# Patient Record
Sex: Female | Born: 1995 | Race: Black or African American | Hispanic: No | Marital: Single | Smoking: Never smoker
Health system: Southern US, Community
[De-identification: ages and names within clinical notes are randomized; demographics above are authoritative.]

## PROBLEM LIST (undated history)

## (undated) DIAGNOSIS — O139 Gestational [pregnancy-induced] hypertension without significant proteinuria, unspecified trimester: Secondary | ICD-10-CM

## (undated) DIAGNOSIS — O159 Eclampsia, unspecified as to time period: Secondary | ICD-10-CM

## (undated) DIAGNOSIS — R519 Headache, unspecified: Secondary | ICD-10-CM

## (undated) DIAGNOSIS — R51 Headache: Secondary | ICD-10-CM

## (undated) HISTORY — DX: Headache: R51

## (undated) HISTORY — DX: Headache, unspecified: R51.9

## (undated) HISTORY — PX: NO PAST SURGERIES: SHX2092

---
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
## 2014-03-19 ENCOUNTER — Telehealth: Payer: Self-pay | Admitting: Obstetrics & Gynecology

## 2014-03-23 ENCOUNTER — Ambulatory Visit: Payer: Self-pay | Admitting: Obstetrics & Gynecology

## 2014-03-23 NOTE — Telephone Encounter (Signed)
10.19.2015 - no call from patient.brm

## 2014-06-01 ENCOUNTER — Encounter: Payer: Self-pay | Admitting: *Deleted

## 2014-06-02 ENCOUNTER — Encounter: Payer: Self-pay | Admitting: Obstetrics & Gynecology

## 2014-09-06 ENCOUNTER — Encounter (HOSPITAL_COMMUNITY): Payer: Self-pay | Admitting: Emergency Medicine

## 2014-09-06 ENCOUNTER — Emergency Department (HOSPITAL_COMMUNITY)
Admission: EM | Admit: 2014-09-06 | Discharge: 2014-09-06 | Disposition: A | Discharge status: Home / Self Care | Payer: Medicaid Other | Attending: Emergency Medicine | Admitting: Emergency Medicine

## 2014-09-06 DIAGNOSIS — R079 Chest pain, unspecified: Secondary | ICD-10-CM | POA: Diagnosis present

## 2014-09-06 DIAGNOSIS — R0789 Other chest pain: Secondary | ICD-10-CM | POA: Diagnosis not present

## 2014-09-06 LAB — RAPID URINE DRUG SCREEN, HOSP PERFORMED
Amphetamines: NOT DETECTED
Barbiturates: NOT DETECTED
Benzodiazepines: NOT DETECTED
Cocaine: NOT DETECTED
Opiates: NOT DETECTED
Tetrahydrocannabinol: NOT DETECTED

## 2014-09-06 LAB — BASIC METABOLIC PANEL
ANION GAP: 9 (ref 5–15)
BUN: 6 mg/dL (ref 6–23)
CO2: 23 mmol/L (ref 19–32)
CREATININE: 0.74 mg/dL (ref 0.50–1.10)
Calcium: 9 mg/dL (ref 8.4–10.5)
Chloride: 103 mmol/L (ref 96–112)
GFR calc non Af Amer: >90 mL/min (ref >90)
Glucose, Bld: 88 mg/dL (ref 70–99)
Potassium: 3.8 mmol/L (ref 3.5–5.1)
Sodium: 135 mmol/L (ref 135–145)

## 2014-09-06 LAB — CBC
HCT: 33 % — ABNORMAL LOW (ref 36.0–46.0)
Hemoglobin: 10.7 g/dL — ABNORMAL LOW (ref 12.0–15.0)
MCH: 29 pg (ref 26.0–34.0)
MCHC: 32.4 g/dL (ref 30.0–36.0)
MCV: 89.4 fL (ref 78.0–100.0)
Platelets: 409 10*3/uL — ABNORMAL HIGH (ref 150–400)
RBC: 3.69 MIL/uL — ABNORMAL LOW (ref 3.87–5.11)
RDW: 12.1 % (ref 11.5–15.5)
WBC: 7.6 10*3/uL (ref 4.0–10.5)

## 2014-09-06 LAB — I-STAT TROPONIN, ED: Troponin i, poc: 0 ng/mL (ref 0.00–0.08)
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
## 2014-09-06 NOTE — Discharge Instructions (Signed)
Please refrain from alcohol use, and abuse. If you or melena close you feel she needs to seek further evaluation and management for alcohol-related problems please follow-up with substance abuse counseling. Chest wall pain can be relieved with ibuprofen or Tylenol, ice. If new or worsening signs or symptoms present please return immediately for further evaluation and management.

## 2014-09-08 ENCOUNTER — Telehealth (HOSPITAL_BASED_OUTPATIENT_CLINIC_OR_DEPARTMENT_OTHER): Payer: Self-pay | Admitting: Emergency Medicine

## 2014-09-09 ENCOUNTER — Emergency Department (HOSPITAL_COMMUNITY)
Admission: EM | Admit: 2014-09-09 | Discharge: 2014-09-09 | Disposition: A | Discharge status: Home / Self Care | Payer: Medicaid Other | Attending: Emergency Medicine | Admitting: Emergency Medicine

## 2014-09-09 ENCOUNTER — Encounter (HOSPITAL_COMMUNITY): Payer: Self-pay | Admitting: *Deleted

## 2014-09-09 ENCOUNTER — Emergency Department (HOSPITAL_COMMUNITY): Payer: Medicaid Other

## 2014-09-09 DIAGNOSIS — R519 Headache, unspecified: Secondary | ICD-10-CM

## 2014-09-09 DIAGNOSIS — R51 Headache: Secondary | ICD-10-CM | POA: Diagnosis not present

## 2014-09-09 MED ORDER — ASPIRIN-ACETAMINOPHEN-CAFFEINE 250-250-65 MG PO TABS: 1.0000 | ORAL_TABLET | Freq: Four times a day (QID) | ORAL | Status: DC | PRN

## 2014-09-09 MED ORDER — SODIUM CHLORIDE 0.9 % IV BOLUS (SEPSIS): 1000.0000 mL | Freq: Once | INTRAVENOUS | Status: DC

## 2014-09-09 MED ORDER — HYDROCODONE-ACETAMINOPHEN 5-325 MG PO TABS
1.0000 | ORAL_TABLET | Freq: Once | ORAL | Status: AC
  Administered 2014-09-09: 1 via ORAL
  Filled 2014-09-09: qty 1

## 2014-09-09 MED ORDER — ONDANSETRON 4 MG PO TBDP
4.0000 mg | ORAL_TABLET | Freq: Once | ORAL | Status: AC
  Administered 2014-09-09: 4 mg via ORAL
  Filled 2014-09-09: qty 1

## 2014-09-09 NOTE — ED Notes (Signed)
Pt sts she has had increased headaches in the last few months.  Pt sts she feels they usually "come out of nowhere" but when asked if she feels like they come at any specific time she sts "after I eat bojangles and noodles".

## 2014-09-09 NOTE — Discharge Instructions (Signed)
Headaches, Frequently Asked Questions °MIGRAINE HEADACHES °Q: What is migraine? What causes it? How can I treat it? °A: Generally, migraine headaches begin as a dull ache. Then they develop into a constant, throbbing, and pulsating pain. You may experience pain at the temples. You may experience pain at the front or back of one or both sides of the head. The pain is usually accompanied by a combination of: °· Nausea. °· Vomiting. °· Sensitivity to light and noise. °Some people (about 15%) experience an aura (see below) before an attack. The cause of migraine is believed to be chemical reactions in the brain. Treatment for migraine may include over-the-counter or prescription medications. It may also include self-help techniques. These include relaxation training and biofeedback.  °Q: What is an aura? °A: About 15% of people with migraine get an "aura". This is a sign of neurological symptoms that occur before a migraine headache. You may see wavy or jagged lines, dots, or flashing lights. You might experience tunnel vision or blind spots in one or both eyes. The aura can include visual or auditory hallucinations (something imagined). It may include disruptions in smell (such as strange odors), taste or touch. Other symptoms include: °· Numbness. °· A "pins and needles" sensation. °· Difficulty in recalling or speaking the correct word. °These neurological events may last as long as 60 minutes. These symptoms will fade as the headache begins. °Q: What is a trigger? °A: Certain physical or environmental factors can lead to or "trigger" a migraine. These include: °· Foods. °· Hormonal changes. °· Weather. °· Stress. °It is important to remember that triggers are different for everyone. To help prevent migraine attacks, you need to figure out which triggers affect you. Keep a headache diary. This is a good way to track triggers. The diary will help you talk to your healthcare professional about your condition. °Q: Does  weather affect migraines? °A: Bright sunshine, hot, humid conditions, and drastic changes in barometric pressure may lead to, or "trigger," a migraine attack in some people. But studies have shown that weather does not act as a trigger for everyone with migraines. °Q: What is the link between migraine and hormones? °A: Hormones start and regulate many of your body's functions. Hormones keep your body in balance within a constantly changing environment. The levels of hormones in your body are unbalanced at times. Examples are during menstruation, pregnancy, or menopause. That can lead to a migraine attack. In fact, about three quarters of all women with migraine report that their attacks are related to the menstrual cycle.  °Q: Is there an increased risk of stroke for migraine sufferers? °A: The likelihood of a migraine attack causing a stroke is very remote. That is not to say that migraine sufferers cannot have a stroke associated with their migraines. In persons under age 40, the most common associated factor for stroke is migraine headache. But over the course of a person's normal life span, the occurrence of migraine headache may actually be associated with a reduced risk of dying from cerebrovascular disease due to stroke.  °Q: What are acute medications for migraine? °A: Acute medications are used to treat the pain of the headache after it has started. Examples over-the-counter medications, NSAIDs, ergots, and triptans.  °Q: What are the triptans? °A: Triptans are the newest class of abortive medications. They are specifically targeted to treat migraine. Triptans are vasoconstrictors. They moderate some chemical reactions in the brain. The triptans work on receptors in your brain. Triptans help   to restore the balance of a neurotransmitter called serotonin. Fluctuations in levels of serotonin are thought to be a main cause of migraine.  °Q: Are over-the-counter medications for migraine effective? °A:  Over-the-counter, or "OTC," medications may be effective in relieving mild to moderate pain and associated symptoms of migraine. But you should see your caregiver before beginning any treatment regimen for migraine.  °Q: What are preventive medications for migraine? °A: Preventive medications for migraine are sometimes referred to as "prophylactic" treatments. They are used to reduce the frequency, severity, and length of migraine attacks. Examples of preventive medications include antiepileptic medications, antidepressants, beta-blockers, calcium channel blockers, and NSAIDs (nonsteroidal anti-inflammatory drugs). °Q: Why are anticonvulsants used to treat migraine? °A: During the past few years, there has been an increased interest in antiepileptic drugs for the prevention of migraine. They are sometimes referred to as "anticonvulsants". Both epilepsy and migraine may be caused by similar reactions in the brain.  °Q: Why are antidepressants used to treat migraine? °A: Antidepressants are typically used to treat people with depression. They may reduce migraine frequency by regulating chemical levels, such as serotonin, in the brain.  °Q: What alternative therapies are used to treat migraine? °A: The term "alternative therapies" is often used to describe treatments considered outside the scope of conventional Western medicine. Examples of alternative therapy include acupuncture, acupressure, and yoga. Another common alternative treatment is herbal therapy. Some herbs are believed to relieve headache pain. Always discuss alternative therapies with your caregiver before proceeding. Some herbal products contain arsenic and other toxins. °TENSION HEADACHES °Q: What is a tension-type headache? What causes it? How can I treat it? °A: Tension-type headaches occur randomly. They are often the result of temporary stress, anxiety, fatigue, or anger. Symptoms include soreness in your temples, a tightening band-like sensation  around your head (a "vice-like" ache). Symptoms can also include a pulling feeling, pressure sensations, and contracting head and neck muscles. The headache begins in your forehead, temples, or the back of your head and neck. Treatment for tension-type headache may include over-the-counter or prescription medications. Treatment may also include self-help techniques such as relaxation training and biofeedback. °CLUSTER HEADACHES °Q: What is a cluster headache? What causes it? How can I treat it? °A: Cluster headache gets its name because the attacks come in groups. The pain arrives with little, if any, warning. It is usually on one side of the head. A tearing or bloodshot eye and a runny nose on the same side of the headache may also accompany the pain. Cluster headaches are believed to be caused by chemical reactions in the brain. They have been described as the most severe and intense of any headache type. Treatment for cluster headache includes prescription medication and oxygen. °SINUS HEADACHES °Q: What is a sinus headache? What causes it? How can I treat it? °A: When a cavity in the bones of the face and skull (a sinus) becomes inflamed, the inflammation will cause localized pain. This condition is usually the result of an allergic reaction, a tumor, or an infection. If your headache is caused by a sinus blockage, such as an infection, you will probably have a fever. An x-ray will confirm a sinus blockage. Your caregiver's treatment might include antibiotics for the infection, as well as antihistamines or decongestants.  °REBOUND HEADACHES °Q: What is a rebound headache? What causes it? How can I treat it? °A: A pattern of taking acute headache medications too often can lead to a condition known as "rebound headache."   A pattern of taking too much headache medication includes taking it more than 2 days per week or in excessive amounts. That means more than the label or a caregiver advises. With rebound  headaches, your medications not only stop relieving pain, they actually begin to cause headaches. Doctors treat rebound headache by tapering the medication that is being overused. Sometimes your caregiver will gradually substitute a different type of treatment or medication. Stopping may be a challenge. Regularly overusing a medication increases the potential for serious side effects. Consult a caregiver if you regularly use headache medications more than 2 days per week or more than the label advises. °ADDITIONAL QUESTIONS AND ANSWERS °Q: What is biofeedback? °A: Biofeedback is a self-help treatment. Biofeedback uses special equipment to monitor your body's involuntary physical responses. Biofeedback monitors: °· Breathing. °· Pulse. °· Heart rate. °· Temperature. °· Muscle tension. °· Brain activity. °Biofeedback helps you refine and perfect your relaxation exercises. You learn to control the physical responses that are related to stress. Once the technique has been mastered, you do not need the equipment any more. °Q: Are headaches hereditary? °A: Four out of five (80%) of people that suffer report a family history of migraine. Scientists are not sure if this is genetic or a family predisposition. Despite the uncertainty, a child has a 50% chance of having migraine if one parent suffers. The child has a 75% chance if both parents suffer.  °Q: Can children get headaches? °A: By the time they reach high school, most young people have experienced some type of headache. Many safe and effective approaches or medications can prevent a headache from occurring or stop it after it has begun.  °Q: What type of doctor should I see to diagnose and treat my headache? °A: Start with your primary caregiver. Discuss his or her experience and approach to headaches. Discuss methods of classification, diagnosis, and treatment. Your caregiver may decide to recommend you to a headache specialist, depending upon your symptoms or other  physical conditions. Having diabetes, allergies, etc., may require a more comprehensive and inclusive approach to your headache. The [employer] will provide, upon request, a list of NHF physician members in your state. °Document Released: 08/12/2003 Document Revised: 08/14/2011 Document Reviewed: 01/20/2008 °Exit[employer]® Patient Information ©2015 [employer]. This information is not intended to replace advice given to you by your health care provider. Make sure you discuss any questions you have with your health care provider. ° °General Headache Without Cause °A headache is pain or discomfort felt around the head or neck area. The specific cause of a headache may not be found. There are many causes and types of headaches. A few common ones are: °· Tension headaches. °· Migraine headaches. °· Cluster headaches. °· Chronic daily headaches. °HOME CARE INSTRUCTIONS  °· Keep all follow-up appointments with your caregiver or any specialist referral. °· Only take over-the-counter or prescription medicines for pain or discomfort as directed by your caregiver. °· Lie down in a dark, quiet room when you have a headache. °· Keep a headache journal to find out what may trigger your migraine headaches. For example, write down: °¨ What you eat and drink. °¨ How much sleep you get. °¨ Any change to your diet or medicines. °· Try massage or other relaxation techniques. °· Put ice packs or heat on the head and neck. Use these 3 to 4 times per day for 15 to 20 minutes each time, or as needed. °· Limit stress. °· Sit up   straight, and do not tense your muscles. °· Quit smoking if you smoke. °· Limit alcohol use. °· Decrease the amount of caffeine you drink, or stop drinking caffeine. °· Eat and sleep on a regular schedule. °· Get 7 to 9 hours of sleep, or as recommended by your caregiver. °· Keep lights dim if bright lights bother you and make your headaches worse. °SEEK MEDICAL CARE IF:  °· You have problems with the  medicines you were prescribed. °· Your medicines are not working. °· You have a change from the usual headache. °· You have nausea or vomiting. °SEEK IMMEDIATE MEDICAL CARE IF:  °· Your headache becomes severe. °· You have a fever. °· You have a stiff neck. °· You have loss of vision. °· You have muscular weakness or loss of muscle control. °· You start losing your balance or have trouble walking. °· You feel faint or pass out. °· You have severe symptoms that are different from your first symptoms. °MAKE SURE YOU:  °· Understand these instructions. °· Will watch your condition. °· Will get help right away if you are not doing well or get worse. °Document Released: 05/22/2005 Document Revised: 08/14/2011 Document Reviewed: 06/07/2011 °Exit[employer]® Patient Information ©2015 [employer]. This information is not intended to replace advice given to you by your health care provider. Make sure you discuss any questions you have with your health care provider. ° °

## 2014-09-09 NOTE — ED Notes (Signed)
Headache since she woke up this evening.  No v nausea.  lmp   March 15

## 2014-09-09 NOTE — ED Provider Notes (Signed)
CSN: [id number]     Arrival date & time 09/09/14  2045 History   First MD Initiated Contact with Patient 09/09/14 2106     Chief Complaint  Patient presents with  . Headache     (Consider location/radiation/quality/duration/timing/severity/associated sxs/prior Treatment) HPI    PCP: No PCP Per Patient Blood pressure 133/91, pulse 74, temperature 98.5 F (36.9 C), resp. rate 18, height  (1.626 m), weight 203 lb (92.08 kg), last menstrual period 08/19/2014, SpO2 100 %.  Heather Noble is a 19 y.o.female without any significant PMH presents to the ER with complaints of headache. She reports having reoccuring headaches over the past few months that are frontal headache. The headaches do not have a pattern and come as a 10/10 pain. The headache are frontal. She denies doing any hobby or job that requires eye strain or reading. The pain is stabbing and is associated with mild photophobia, she also endorses feeling dizzy with them. No hx of headaches prior to the last few months. Today she has had the headache all day, took Ibuprofen 600-800 mg and that did not help until now that she is in the ED the pain had improved to 5-7/10. She comes to the ER today because she is concerned about her new headaches and that the headache today was more severe than normal.  Negative Review of Symptoms: injury, neck pain, fevers, N/V/D, weakness (focal or generalized), chest pain, cough, nasal congestion, sore throat, body aches, recent tick bite.    History reviewed. No pertinent past medical history. History reviewed. No pertinent past surgical history. No family history on file. History  Substance Use Topics  . Smoking status: Never Smoker   . Smokeless tobacco: Not on file  . Alcohol Use: Yes   OB History    No data available     Review of Systems  10 Systems reviewed and are negative for acute change except as noted in the HPI.     Allergies  Review of patient's allergies indicates  no known allergies.  Home Medications   Prior to Admission medications   Medication Sig Start Date End Date Taking? Authorizing Provider  ibuprofen (ADVIL,MOTRIN) 200 MG tablet Take 600 mg by mouth every 6 (six) hours as needed for headache.   Yes Historical Provider, MD  amoxicillin (AMOXIL) 500 MG capsule Take 1 capsule (500 mg total) by mouth 3 (three) times daily. Patient not taking: Reported on 09/06/2014 09/03/13   Heather Hoff, MD  aspirin-acetaminophen-caffeine [facility] MIGRAINE) [phone] MG per tablet Take 1 tablet by mouth every 6 (six) hours as needed for headache. 09/09/14   Marcea Rojek Neva Seat, PA-C   BP 138/90 mmHg  Pulse 63  Temp(Src) 98.5 F (36.9 C)  Resp 18  Ht  (1.626 m)  Wt 203 lb (92.08 kg)  BMI 34.83 kg/m2  SpO2 100%  LMP 08/19/2014 (Approximate) Physical Exam  Constitutional: She appears well-developed and well-nourished. No distress.  HENT:  Head: Normocephalic and atraumatic.  Eyes: Pupils are equal, round, and reactive to light.  Neck: Normal range of motion. Neck supple. No spinous process tenderness and no muscular tenderness present.  Cardiovascular: Normal rate and regular rhythm.   Pulmonary/Chest: Effort normal.  Abdominal: Soft.  Neurological: She is alert.  Cranial nerves II-VIII and X-XII evaluated and show no deficits. Pt alert and oriented x 3 Upper and lower extremity strength is symmetrical and physiologic Normal muscular tone No facial droop Coordination intact, no limb ataxia No pronator drift   Skin:  Skin is warm and dry.  Nursing note and vitals reviewed.   ED Course  Procedures (including critical care time) Labs Review Labs Reviewed - No data to display  Imaging Review Ct Head Wo Contrast  09/09/2014   CLINICAL DATA:  Headache  EXAM: CT HEAD WITHOUT CONTRAST  TECHNIQUE: Contiguous axial images were obtained from the base of the skull through the vertex without intravenous contrast.  COMPARISON:  None.  FINDINGS: No skull  fracture is noted. Probable mucous retention cyst right maxillary sinus measures 8.4 mm. Mucous retention cyst in left maxillary sinus measures 7 mm. The mastoid air cells are unremarkable.  No intracranial hemorrhage, mass effect or midline shift. No hydrocephalus. The gray and white-matter differentiation is preserved. No mass lesion is noted on this unenhanced scan.  IMPRESSION: No acute intracranial abnormality. Probable small mucous retention cyst bilateral maxillary sinus   Electronically Signed   By: Natasha MeadLiviu  Pop M.D.   On: 09/09/2014 22:54     EKG Interpretation None      MDM   Final diagnoses:  Headache    Head CT is reassuring for no intracranial abnormality to cause headache. She was given a dose of pain medication in the ED and reports little to no headache now. Referral to Neuro and rx for Excedrin Migraine.  Medications  HYDROcodone-acetaminophen (NORCO/VICODIN) 5-325 MG per tablet 1 tablet (1 tablet Oral Given 09/09/14 2215)  ondansetron (ZOFRAN-ODT) disintegrating tablet 4 mg (4 mg Oral Given 09/09/14 2215)   Presentation is non concerning for [facility], ICH, Meningitis, or temporal arteritis. Pt is afebrile with no focal neuro deficits, nuchal rigidity, or change in vision. The patient denies any symptoms of neurological impairment or TIA's; no amaurosis, diplopia, dysphasia, or unilateral disturbance of motor or sensory function. No loss of balance or vertigo.  18 y.o.Heather Noble's evaluation in the Emergency Department is complete. It has been determined that no acute conditions requiring further emergency intervention are present at this time. The patient/guardian have been advised of the diagnosis and plan. We have discussed signs and symptoms that warrant return to the ED, such as changes or worsening in symptoms.  Vital signs are stable at discharge. Filed Vitals:   09/09/14 2230  BP: 138/90  Pulse: 63  Temp:   Resp:     Patient/guardian has voiced understanding  and agreed to follow-up with the PCP or specialist.     Marlon Peliffany Analisa Sledd, PA-C 09/09/14 2311  Bethann BerkshireJoseph Zammit, MD 09/09/14 2355

## 2014-10-16 ENCOUNTER — Ambulatory Visit (INDEPENDENT_AMBULATORY_CARE_PROVIDER_SITE_OTHER): Payer: Medicaid Other | Admitting: Neurology

## 2014-10-16 ENCOUNTER — Encounter: Payer: Self-pay | Admitting: Neurology

## 2014-10-16 VITALS — BP 140/82 | HR 84 | Resp 20 | Ht 65.0 in | Wt 198.8 lb

## 2014-10-16 DIAGNOSIS — G43109 Migraine with aura, not intractable, without status migrainosus: Secondary | ICD-10-CM | POA: Diagnosis not present

## 2014-10-16 NOTE — Patient Instructions (Signed)
When you get a migraine, use the Excedrin.  On a regular basis, you should not be taking any pain reliever more than 2 days out of the week if you have headaches (prevents rebound headache) Try to improve diet and avoid migraine triggers.  Drink water.  Start routine exercise Follow up in 3 months.

## 2014-10-16 NOTE — Progress Notes (Signed)
NEUROLOGY CONSULTATION NOTE  Heather Noble MRN: [id number] DOB: January 10, 1996  Referring provider: Dr. Estell HarpinZammit (ED) Primary care provider: none  Reason for consult:  migraine  HISTORY OF PRESENT ILLNESS: Heather Noble is a 19 year old right-handed woman who presents for headache and vision changes.  Records from ED, labs and CT of the head reviewed.  Onset:  She has had migraines since highschool.  Two months ago, they increased but has since resolved for the past month Location:  Bi-temporal Quality:  pounding Intensity:  10/10 Aura:  scotomas Prodrome:  none Associated symptoms:  Photophobia, phonophobia, osmophobia, fatigue.  Sometimes nausea Duration:  8 hours Frequency:  2 month ago, they were occuring daily (mostly at work).  Usually they occur 1-2 days per week.  She has not had any migraines for one month. Triggers/exacerbating factors:  Fast food, smoke Relieving factors:  Nap, water Activity:  Able to function  Past abortive therapy:  ibuprofen 600mg -800mg , Excedrin Migraine (effective) Past preventative therapy:  none  Current abortive therapy:  Currently none Current preventative therapy:  none  She had a CT of the head performed in the ED on 09/09/14, which was unremarkable.  Caffeine:  Soda daily Alcohol:  occasionally Smoker:  No  Diet:  Fast food Exercise:  no Depression/stress:  stress Sleep hygiene:  good Family history of headache:  Mom.  No family history of aneurysms  PAST MEDICAL HISTORY: Past Medical History  Diagnosis Date  . Headache     PAST SURGICAL HISTORY: No past surgical history on file.  MEDICATIONS: Current Outpatient Prescriptions on File Prior to Visit  Medication Sig Dispense Refill  . aspirin-acetaminophen-caffeine (EXCEDRIN MIGRAINE) 250-250-65 MG per tablet Take 1 tablet by mouth every 6 (six) hours as needed for headache. 30 tablet 0  . ibuprofen (ADVIL,MOTRIN) 200 MG tablet Take 600 mg by mouth every 6 (six) hours  as needed for headache.    Marland Kitchen. amoxicillin (AMOXIL) 500 MG capsule Take 1 capsule (500 mg total) by mouth 3 (three) times daily. (Patient not taking: Reported on 10/16/2014) 30 capsule 0   No current facility-administered medications on file prior to visit.    ALLERGIES: No Known Allergies  FAMILY HISTORY: Family History  Problem Relation Age of Onset  . Hypertension Maternal Grandmother   . Osteoarthritis Maternal Grandmother   . Gout Maternal Grandmother   . Thyroid disease Maternal Grandmother   . Cancer Paternal Grandfather     unknown    SOCIAL HISTORY: History   Social History  . Marital Status: Single    Spouse Name: N/A  . Number of Children: N/A  . Years of Education: N/A   Occupational History  . Not on file.   Social History Main Topics  . Smoking status: Never Smoker   . Smokeless tobacco: Never Used  . Alcohol Use: 0.0 oz/week    0 Standard drinks or equivalent per week  . Drug Use: No  . Sexual Activity:    Partners: Male   Other Topics Concern  . Not on file   Social History Narrative    REVIEW OF SYSTEMS: Constitutional: No fevers, chills, or sweats, no generalized fatigue, change in appetite Eyes: No visual changes, double vision, eye pain Ear, nose and throat: No hearing loss, ear pain, nasal congestion, sore throat Cardiovascular: No chest pain, palpitations Respiratory:  No shortness of breath at rest or with exertion, wheezes GastrointestinaI: No nausea, vomiting, diarrhea, abdominal pain, fecal incontinence Genitourinary:  No dysuria, urinary retention or frequency Musculoskeletal:  No neck pain, back pain Integumentary: No rash, pruritus, skin lesions Neurological: as above Psychiatric: No depression, insomnia, anxiety Endocrine: No palpitations, fatigue, diaphoresis, mood swings, change in appetite, change in weight, increased thirst Hematologic/Lymphatic:  No anemia, purpura, petechiae. Allergic/Immunologic: no itchy/runny eyes, nasal  congestion, recent allergic reactions, rashes  PHYSICAL EXAM: Filed Vitals:   10/16/14 0939  BP: 140/82  Pulse: 84  Resp: 20   General: No acute distress Head:  Normocephalic/atraumatic Eyes:  fundi unremarkable, without vessel changes, exudates, hemorrhages or papilledema. Neck: supple, no paraspinal tenderness, full range of motion Back: No paraspinal tenderness Heart: regular rate and rhythm Lungs: Clear to auscultation bilaterally. Vascular: No carotid bruits. Neurological Exam: Mental status: alert and oriented to person, place, and time, recent and remote memory intact, fund of knowledge intact, attention and concentration intact, speech fluent and not dysarthric, language intact. Cranial nerves: CN I: not tested CN II: pupils equal, round and reactive to light, visual fields intact, fundi unremarkable, without vessel changes, exudates, hemorrhages or papilledema. CN III, IV, VI:  full range of motion, no nystagmus, no ptosis CN V: facial sensation intact CN VII: upper and lower face symmetric CN VIII: hearing intact CN IX, X: gag intact, uvula midline CN XI: sternocleidomastoid and trapezius muscles intact CN XII: tongue midline Bulk & Tone: normal, no fasciculations. Motor:  5/5 throughout Sensation:  Temperature and vibration intact Deep Tendon Reflexes:  2+ throughout, toes downgoing Finger to nose testing:  No dysmetria Heel to shin:  No dysmetria Gait:  Normal station and stride.  Able to turn and walk in tandem. Romberg negative.  IMPRESSION: Migraine with aura, stable  PLAN: 1.  Will use Excedrin Migraine if it recurs 2.  Since it is not chronic, will not start a preventative 3.  Improve diet and exercise 4.  Follow up in 3 months.  Thank you for allowing me to take part in the care of this patient.  Shon MilletAdam Robynn Marcel, DO

## 2015-02-05 ENCOUNTER — Ambulatory Visit: Payer: Medicaid Other | Admitting: Neurology

## 2015-02-05 ENCOUNTER — Encounter: Payer: Self-pay | Admitting: Neurology

## 2015-02-09 ENCOUNTER — Emergency Department (HOSPITAL_COMMUNITY)
Admission: EM | Admit: 2015-02-09 | Discharge: 2015-02-09 | Disposition: A | Discharge status: Home / Self Care | Payer: Medicaid Other | Attending: Emergency Medicine | Admitting: Emergency Medicine

## 2015-02-09 ENCOUNTER — Emergency Department (HOSPITAL_COMMUNITY): Payer: Medicaid Other

## 2015-02-09 ENCOUNTER — Encounter (HOSPITAL_COMMUNITY): Payer: Self-pay | Admitting: Emergency Medicine

## 2015-02-09 DIAGNOSIS — R0789 Other chest pain: Secondary | ICD-10-CM | POA: Diagnosis not present

## 2015-02-09 DIAGNOSIS — R079 Chest pain, unspecified: Secondary | ICD-10-CM

## 2015-02-09 LAB — BASIC METABOLIC PANEL
Anion gap: 7 (ref 5–15)
BUN: 9 mg/dL (ref 6–20)
CO2: 27 mmol/L (ref 22–32)
Calcium: 9.2 mg/dL (ref 8.9–10.3)
Chloride: 103 mmol/L (ref 101–111)
Creatinine, Ser: 0.87 mg/dL (ref 0.44–1.00)
GFR calc non Af Amer: >60 mL/min (ref >60)
Glucose, Bld: 93 mg/dL (ref 65–99)
POTASSIUM: 3.4 mmol/L — AB (ref 3.5–5.1)
SODIUM: 137 mmol/L (ref 135–145)

## 2015-02-09 LAB — CBC
HEMATOCRIT: 33.5 % — AB (ref 36.0–46.0)
HEMOGLOBIN: 10.7 g/dL — AB (ref 12.0–15.0)
MCH: 29.2 pg (ref 26.0–34.0)
MCHC: 31.9 g/dL (ref 30.0–36.0)
MCV: 91.3 fL (ref 78.0–100.0)
Platelets: 395 10*3/uL (ref 150–400)
RBC: 3.67 MIL/uL — ABNORMAL LOW (ref 3.87–5.11)
RDW: 11.7 % (ref 11.5–15.5)
WBC: 5.5 10*3/uL (ref 4.0–10.5)

## 2015-02-09 LAB — I-STAT TROPONIN, ED: Troponin i, poc: 0 ng/mL (ref 0.00–0.08)

## 2015-02-09 NOTE — ED Notes (Signed)
Patient transported to X-ray 

## 2015-02-09 NOTE — ED Notes (Signed)
Pt states that earlier at work she started having right sided chest pain that was sharp and constant. Pt did call EMS and was told by them that thought it was muscle related.  Pt states that pain decreased after taking ASA .  Pt denies any radiation of pain but states that she did have SHOB with pain.

## 2015-02-09 NOTE — ED Notes (Signed)
No answer from the lobby when called for blood draw, other pts states she went to the restroom .

## 2015-02-09 NOTE — ED Provider Notes (Signed)
CSN: [id number]     Arrival date & time 02/09/15  1534 History   First MD Initiated Contact with Patient 02/09/15 1631     Chief Complaint  Patient presents with  . Chest Pain     (Consider location/radiation/quality/duration/timing/severity/associated sxs/prior Treatment) Patient is a 19 y.o. female presenting with chest pain. The history is provided by the patient.  Chest Pain Pain location:  R chest Pain quality: sharp and shooting   Pain radiates to:  Does not radiate Pain radiates to the back: no   Pain severity:  Moderate Onset quality:  Sudden Duration:  2 hours Timing:  Constant Progression:  Partially resolved Chronicity:  New Relieved by:  Nothing Worsened by:  Nothing tried Ineffective treatments:  None tried Associated symptoms: no dizziness, no fever, no headache, no nausea, no palpitations, no shortness of breath and not vomiting   Risk factors: obesity   Risk factors: no coronary artery disease, no diabetes mellitus, no high cholesterol and no hypertension    19 yo F with a chief complaint of right sided sharp chest pain. This started a couple hours ago. Pain is worse with movement or palpation. Patient states she had a little bit of shortness breath with it because it hurt to take a big deep breath. Patient denies history of prior blood clots. Patient denies estrogen supplementation. Denies recent trips. Patient denies surgery in the last 30 days. Patient denies significant cardiac history in her family at a young age.  Denies fever chills. Denies cough.  Past Medical History  Diagnosis Date  . Headache    History reviewed. No pertinent past surgical history. Family History  Problem Relation Age of Onset  . Hypertension Maternal Grandmother   . Osteoarthritis Maternal Grandmother   . Gout Maternal Grandmother   . Thyroid disease Maternal Grandmother   . Cancer Paternal Grandfather     unknown   Social History  Substance Use Topics  . Smoking status: Never  Smoker   . Smokeless tobacco: Never Used  . Alcohol Use: 0.0 oz/week    0 Standard drinks or equivalent per week   OB History    No data available     Review of Systems  Constitutional: Negative for fever and chills.  HENT: Negative for congestion and rhinorrhea.   Eyes: Negative for redness and visual disturbance.  Respiratory: Negative for shortness of breath and wheezing.   Cardiovascular: Negative for chest pain and palpitations.  Gastrointestinal: Negative for nausea and vomiting.  Genitourinary: Negative for dysuria and urgency.  Musculoskeletal: Negative for myalgias and arthralgias.  Skin: Negative for pallor and wound.  Neurological: Negative for dizziness and headaches.      Allergies  Review of patient's allergies indicates no known allergies.  Home Medications   Prior to Admission medications   Medication Sig Start Date End Date Taking? Authorizing Provider  ibuprofen (ADVIL,MOTRIN) 200 MG tablet Take 600 mg by mouth every 6 (six) hours as needed for headache.   Yes Historical Provider, MD  loperamide (IMODIUM) 2 MG capsule Take 2 mg by mouth as needed for diarrhea or loose stools.   Yes Historical Provider, MD  ondansetron (ZOFRAN-ODT) 4 MG disintegrating tablet Take 4 mg by mouth every 8 (eight) hours as needed for nausea or vomiting.   Yes Historical Provider, MD   BP 128/70 mmHg  Pulse 56  Temp(Src) 98.5 F (36.9 C) (Oral)  Resp 15  SpO2 100%  LMP 01/25/2015 Physical Exam  Constitutional: She is oriented to person, place,  and time. She appears well-developed and well-nourished. No distress.  HENT:  Head: Normocephalic and atraumatic.  Eyes: EOM are normal. Pupils are equal, round, and reactive to light.  Neck: Normal range of motion. Neck supple.  Cardiovascular: Normal rate and regular rhythm.  Exam reveals no gallop and no friction rub.   No murmur heard. Pulmonary/Chest: Effort normal. She has no wheezes. She has no rales. She exhibits tenderness  (TTP to the right chest wall with recreation of symptoms).  Abdominal: Soft. She exhibits no distension. There is no tenderness. There is no rebound.  Musculoskeletal: She exhibits no edema or tenderness.  Neurological: She is alert and oriented to person, place, and time.  Skin: Skin is warm and dry. She is not diaphoretic.  Psychiatric: She has a normal mood and affect. Her behavior is normal.    ED Course  Procedures (including critical care time) Labs Review Labs Reviewed  BASIC METABOLIC PANEL - Abnormal; Notable for the following:    Potassium 3.4 (*)    All other components within normal limits  CBC - Abnormal; Notable for the following:    RBC 3.67 (*)    Hemoglobin 10.7 (*)    HCT 33.5 (*)    All other components within normal limits  I-STAT TROPOININ, ED    Imaging Review Dg Chest 2 View  02/09/2015   CLINICAL DATA:  Right mid chest pain for 1 hour with shortness of breath.  EXAM: CHEST  2 VIEW  COMPARISON:  None.  FINDINGS: The heart size and mediastinal contours are within normal limits. Both lungs are clear. The visualized skeletal structures are unremarkable.  IMPRESSION: No active cardiopulmonary disease.   Electronically Signed   By: Elige Ko   On: 02/09/2015 16:57   I have personally reviewed and evaluated these images and lab results as part of my medical decision-making.   EKG Interpretation   Date/Time:  Tuesday February 09 2015 15:40:52 EDT Ventricular Rate:  81 PR Interval:  140 QRS Duration: 72 QT Interval:  367 QTC Calculation: 426 R Axis:   54 Text Interpretation:  Sinus rhythm Borderline T abnormalities, inferior  leads No significant change since last tracing Confirmed by Jacqueline Delapena MD,  DANIEL [phone]) on 02/09/2015 4:44:21 PM      MDM   Final diagnoses:  Chest pain, unspecified chest pain type    19 yo with a chief complaint of right-sided chest pain. Appears to be muscular skeletal based on history. Pain reproduced entirely on palpation.  Chest x-ray EKG troponin reassuring.  5:25 PM:  I have discussed the diagnosis/risks/treatment options with the patient and family and believe the pt to be eligible for discharge home to follow-up with PCP. We also discussed returning to the ED immediately if new or worsening sx occur. We discussed the sx which are most concerning (e.g., sudden worsening pain) that necessitate immediate return. Medications administered to the patient during their visit and any new prescriptions provided to the patient are listed below.  Medications given during this visit Medications - No data to display  New Prescriptions   No medications on file     The patient appears reasonably screen and/or stabilized for discharge and I doubt any other medical condition or other [facility] requiring further screening, evaluation, or treatment in the ED at this time prior to discharge.      Melene Plan, DO 02/09/15 1726

## 2015-02-09 NOTE — Discharge Instructions (Signed)
Take 4 over the counter ibuprofen tablets 3 times a day or 2 over-the-counter naproxen tablets twice a day for pain.  Chest Pain (Nonspecific) It is often hard to give a diagnosis for the cause of chest pain. There is always a chance that your pain could be related to something serious, such as a heart attack or a blood clot in the lungs. You need to follow up with your doctor. HOME CARE  If antibiotic medicine was given, take it as directed by your doctor. Finish the medicine even if you start to feel better.  For the next few days, avoid activities that bring on chest pain. Continue physical activities as told by your doctor.  Do not use any tobacco products. This includes cigarettes, chewing tobacco, and e-cigarettes.  Avoid drinking alcohol.  Only take medicine as told by your doctor.  Follow your doctor's suggestions for more testing if your chest pain does not go away.  Keep all doctor visits you made. GET HELP IF:  Your chest pain does not go away, even after treatment.  You have a rash with blisters on your chest.  You have a fever. GET HELP RIGHT AWAY IF:   You have more pain or pain that spreads to your arm, neck, jaw, back, or belly (abdomen).  You have shortness of breath.  You cough more than usual or cough up blood.  You have very bad back or belly pain.  You feel sick to your stomach (nauseous) or throw up (vomit).  You have very bad weakness.  You pass out (faint).  You have chills. This is an emergency. Do not wait to see if the problems will go away. Call your local emergency services (911 in [place] Do not drive yourself to the hospital. MAKE SURE YOU:   Understand these instructions.  Will watch your condition.  Will get help right away if you are not doing well or get worse. Document Released: 11/08/2007 Document Revised: 05/27/2013 Document Reviewed: 11/08/2007 [facility] Patient Information 2015 [place], [place]. This information is not intended  to replace advice given to you by your health care provider. Make sure you discuss any questions you have with your health care provider.

## 2015-02-12 ENCOUNTER — Encounter: Payer: Self-pay | Admitting: *Deleted

## 2015-02-12 NOTE — Progress Notes (Signed)
No show letter sent for missed appointment on 02/05/2015 

## 2015-02-24 ENCOUNTER — Encounter: Payer: Self-pay | Admitting: Neurology

## 2015-02-24 ENCOUNTER — Ambulatory Visit (INDEPENDENT_AMBULATORY_CARE_PROVIDER_SITE_OTHER): Payer: Medicaid Other | Admitting: Neurology

## 2015-02-24 VITALS — BP 108/70 | HR 60 | Temp 98.3°F | Resp 16 | Ht 65.01 in | Wt 193.2 lb

## 2015-02-24 DIAGNOSIS — G43109 Migraine with aura, not intractable, without status migrainosus: Secondary | ICD-10-CM

## 2015-02-24 MED ORDER — TOPIRAMATE 25 MG PO TABS: 25.0000 mg | ORAL_TABLET | Freq: Every day | ORAL | Status: DC

## 2015-02-24 NOTE — Progress Notes (Signed)
NEUROLOGY FOLLOW UP OFFICE NOTE  Shaneice Barsanti [id number]  HISTORY OF PRESENT ILLNESS: Heather Noble is a 19 year old right-handed woman who follows up for migraine.  UPDATE: Intensity:  8/10 Duration:  If takes ibuprofen quickly, then 1 hour.  Otherwise, all day Frequency:  2 days per week Current abortive medication:  Ibuprofen.  Excedrin Migraine eventually caused her feeling sick Antihypertensive medications:  none Antidepressant medications:  none Anticonvulsant medications:  none  Caffeine:  Soda daily Alcohol:  occasionally Smoker:  No  Diet:  Fast food Exercise:  no Depression/stress:  stress Sleep hygiene:  good  HISTORY: Onset:  She has had migraines since highschool.  Two months ago, they increased but has since resolved for the past month Location:  Bi-temporal Quality:  pounding Intensity:  10/10 Aura:  scotomas Prodrome:  none Associated symptoms:  Photophobia, phonophobia, osmophobia, fatigue.  Sometimes nausea Duration:  8 hours Frequency:  In March, they were occuring daily (mostly at work).  Usually they occur 1-2 days per week.  She has not had any migraines in May. Triggers/exacerbating factors:  Fast food, smoke Relieving factors:  Nap, water Activity:  Able to function  Past abortive therapy:  ibuprofen - , Excedrin Migraine (effective) Past preventative therapy:  none  She had a CT of the head performed in the ED on 09/09/14, which was unremarkable.  Family history of headache:  Mom.  No family history of aneurysms  PAST MEDICAL HISTORY: Past Medical History  Diagnosis Date  . Headache     MEDICATIONS: Current Outpatient Prescriptions on File Prior to Visit  Medication Sig Dispense Refill  . ibuprofen (ADVIL,MOTRIN) 200 MG tablet Take 600 mg by mouth every 6 (six) hours as needed for headache.    . loperamide (IMODIUM) 2 MG capsule Take 2 mg by mouth as needed for diarrhea or loose stools.    . ondansetron (ZOFRAN-ODT)  4 MG disintegrating tablet Take 4 mg by mouth every 8 (eight) hours as needed for nausea or vomiting.     No current facility-administered medications on file prior to visit.    ALLERGIES: No Known Allergies  FAMILY HISTORY: Family History  Problem Relation Age of Onset  . Hypertension Maternal Grandmother   . Osteoarthritis Maternal Grandmother   . Gout Maternal Grandmother   . Thyroid disease Maternal Grandmother   . Cancer Paternal Grandfather     unknown    SOCIAL HISTORY: Social History   Social History  . Marital Status: Single    Spouse Name: N/A  . Number of Children: N/A  . Years of Education: N/A   Occupational History  . Not on file.   Social History Main Topics  . Smoking status: Never Smoker   . Smokeless tobacco: Never Used  . Alcohol Use: 0.0 oz/week    0 Standard drinks or equivalent per week  . Drug Use: No  . Sexual Activity:    Partners: Male   Other Topics Concern  . Not on file   Social History Narrative    REVIEW OF SYSTEMS: Constitutional: No fevers, chills, or sweats, no generalized fatigue, change in appetite Eyes: No visual changes, double vision, eye pain Ear, nose and throat: No hearing loss, ear pain, nasal congestion, sore throat Cardiovascular: No chest pain, palpitations Respiratory:  No shortness of breath at rest or with exertion, wheezes GastrointestinaI: No nausea, vomiting, diarrhea, abdominal pain, fecal incontinence Genitourinary:  No dysuria, urinary retention or frequency Musculoskeletal:  No neck pain, back pain Integumentary: No rash,  pruritus, skin lesions Neurological: as above Psychiatric: No depression, insomnia, anxiety Endocrine: No palpitations, fatigue, diaphoresis, mood swings, change in appetite, change in weight, increased thirst Hematologic/Lymphatic:  No anemia, purpura, petechiae. Allergic/Immunologic: no itchy/runny eyes, nasal congestion, recent allergic reactions, rashes  PHYSICAL EXAM: Filed  Vitals:   02/24/15 1323  BP: 108/70  Pulse: 60  Temp: 98.3 F (36.8 C)  Resp: 16   General: No acute distress.  Patient appears well-groomed.  Head:  Normocephalic/atraumatic Eyes:  Fundoscopic exam unremarkable without vessel changes, exudates, hemorrhages or papilledema. Neck: supple, no paraspinal tenderness, full range of motion Heart:  Regular rate and rhythm Lungs:  Clear to auscultation bilaterally Back: No paraspinal tenderness Neurological Exam: alert and oriented to person, place, and time. Attention span and concentration intact, recent and remote memory intact, fund of knowledge intact.  Speech fluent and not dysarthric, language intact.  CN II-XII intact. Fundoscopic exam unremarkable without vessel changes, exudates, hemorrhages or papilledema.  Bulk and tone normal, muscle strength 5/5 throughout.  Sensation to light touch, temperature and vibration intact.  Deep tendon reflexes 2+ throughout, toes downgoing.  Finger to nose and heel to shin testing intact.  Gait normal.  IMPRESSION: Migraine with aura  PLAN: 1.  Start topiramate  at bedtime.  She will call in 4 weeks with update. 2.  Ibuprofen at earliest onset of headache 3.  Avoid triggers (fast food, etc) 4.  Follow up in 3 months.  15 minutes spent face to face with patient, over 50% spent discussing management.  Shon Millet, DO

## 2015-02-24 NOTE — Patient Instructions (Signed)
Migraine Recommendations: 1.  Start topiramate  at bedtime.  Call in 4 weeks with update and we can adjust dose if needed.  Possible side effects include: impaired thinking, sedation, paresthesias (numbness and tingling) and weight loss.  It may cause dehydration and there is a small risk for kidney stones, so make sure to stay hydrated with water during the day.  There is also a very small risk for glaucoma, so if you notice any change in your vision while taking this medication, see an ophthalmologist.  There is also a very small risk of possible suicidal ideation, as it the case with all antiepileptic medications.  2.  Take ibuprofen at earliest onset of headache.  3.  Limit use of pain relievers to no more than 2 days out of the week.  These medications include acetaminophen, ibuprofen, triptans and narcotics.  This will help reduce risk of rebound headaches. 4.  Be aware of common food triggers such as processed sweets, processed foods with nitrites (such as deli meat, hot dogs, sausages), foods with MSG, alcohol (such as wine), chocolate, certain cheeses, certain fruits (dried fruits, some citrus fruit), vinegar, diet soda. 4.  Avoid caffeine 5.  Routine exercise 6.  Proper sleep hygiene 7.  Stay adequately hydrated with water 8.  Keep a headache diary. 9.  Maintain proper stress management. 10.  Do not skip meals. 11.  Consider supplements:  Magnesium oxide  to  daily, riboflavin , Coenzyme Q 10  three times daily 12.  Follow up in 3 months.  CALL IN 4 WEEKS WITH UPDATE

## 2015-06-09 ENCOUNTER — Ambulatory Visit (INDEPENDENT_AMBULATORY_CARE_PROVIDER_SITE_OTHER): Payer: Medicaid Other | Admitting: Neurology

## 2015-06-09 ENCOUNTER — Encounter: Payer: Self-pay | Admitting: Neurology

## 2015-06-09 VITALS — BP 116/76 | HR 88 | Ht 65.0 in | Wt 198.0 lb

## 2015-06-09 DIAGNOSIS — G43109 Migraine with aura, not intractable, without status migrainosus: Secondary | ICD-10-CM

## 2015-06-09 MED ORDER — PROPRANOLOL HCL ER 60 MG PO CP24: 60.0000 mg | ORAL_CAPSULE | Freq: Every day | ORAL | Status: DC

## 2015-06-09 NOTE — Progress Notes (Signed)
NEUROLOGY FOLLOW UP OFFICE NOTE  Heather Noble [id number]  HISTORY OF PRESENT ILLNESS: Heather Noble is a 20 year old right-handed woman who follows up for migraine.  UPDATE: Headaches were severe in October and November but she has been doing well since then.  She takes ibuprofen.  She is no longer taking topamax because she thought it was an as-needed medication.     HISTORY: Onset:  She has had migraines since highschool.  Two months ago, they increased but has since resolved for the past month Location:  Bi-temporal Quality:  pounding Initial Intensity:  10/10; September 8/10 Aura:  scotomas Prodrome:  none Associated symptoms:  Photophobia, phonophobia, osmophobia, fatigue.  Sometimes nausea Initial Duration:  8 hours; September 1 hour Initial Frequency:  In March, they were occuring daily (mostly at work).  Usually they occur 1-2 days per week; September 2 days per week Triggers/exacerbating factors:  Fast food, smoke Relieving factors:  Nap, water Activity:  Able to function  Past abortive therapy:  ibuprofen 600mg -800mg , Excedrin Migraine (effective) Past preventative therapy:  none  She had a CT of the head performed in the ED on 09/09/14, which was unremarkable.  Family history of headache:  Mom.  No family history of aneurysms  PAST MEDICAL HISTORY: Past Medical History  Diagnosis Date  . Headache     MEDICATIONS: Current Outpatient Prescriptions on File Prior to Visit  Medication Sig Dispense Refill  . ibuprofen (ADVIL,MOTRIN) 200 MG tablet Take 600 mg by mouth every 6 (six) hours as needed for headache.    . loperamide (IMODIUM) 2 MG capsule Take 2 mg by mouth as needed for diarrhea or loose stools.    . ondansetron (ZOFRAN-ODT) 4 MG disintegrating tablet Take 4 mg by mouth every 8 (eight) hours as needed for nausea or vomiting.     No current facility-administered medications on file prior to visit.    ALLERGIES: No Known Allergies  FAMILY  HISTORY: Family History  Problem Relation Age of Onset  . Hypertension Maternal Grandmother   . Osteoarthritis Maternal Grandmother   . Gout Maternal Grandmother   . Thyroid disease Maternal Grandmother   . Cancer Paternal Grandfather     unknown    SOCIAL HISTORY: Social History   Social History  . Marital Status: Single    Spouse Name: N/A  . Number of Children: N/A  . Years of Education: N/A   Occupational History  . Not on file.   Social History Main Topics  . Smoking status: Never Smoker   . Smokeless tobacco: Never Used  . Alcohol Use: 0.0 oz/week    0 Standard drinks or equivalent per week  . Drug Use: No  . Sexual Activity:    Partners: Male   Other Topics Concern  . Not on file   Social History Narrative    REVIEW OF SYSTEMS: Constitutional: No fevers, chills, or sweats, no generalized fatigue, change in appetite Eyes: No visual changes, double vision, eye pain Ear, nose and throat: No hearing loss, ear pain, nasal congestion, sore throat Cardiovascular: No chest pain, palpitations Respiratory:  No shortness of breath at rest or with exertion, wheezes GastrointestinaI: No nausea, vomiting, diarrhea, abdominal pain, fecal incontinence Genitourinary:  No dysuria, urinary retention or frequency Musculoskeletal:  No neck pain, back pain Integumentary: No rash, pruritus, skin lesions Neurological: as above Psychiatric: No depression, insomnia, anxiety Endocrine: No palpitations, fatigue, diaphoresis, mood swings, change in appetite, change in weight, increased thirst Hematologic/Lymphatic:  No anemia, purpura, petechiae.  Allergic/Immunologic: no itchy/runny eyes, nasal congestion, recent allergic reactions, rashes  PHYSICAL EXAM: Filed Vitals:   06/09/15 1002  BP: 116/76  Pulse: 88   General: No acute distress.  Patient appears well-groomed.  Head:  Normocephalic/atraumatic Eyes:  Fundoscopic exam unremarkable without vessel changes, exudates,  hemorrhages or papilledema. Neck: supple, no paraspinal tenderness, full range of motion Heart:  Regular rate and rhythm Lungs:  Clear to auscultation bilaterally Back: No paraspinal tenderness Neurological Exam: alert and oriented to person, place, and time. Attention span and concentration intact, recent and remote memory intact, fund of knowledge intact.  Speech fluent and not dysarthric, language intact.  CN II-XII intact. Fundoscopic exam unremarkable without vessel changes, exudates, hemorrhages or papilledema.  Bulk and tone normal, muscle strength 5/5 throughout.  Sensation to light touch, temperature and vibration intact.  Deep tendon reflexes 2+ throughout, toes downgoing.  Finger to nose and heel to shin testing intact.  Gait normal, Romberg negative.  IMPRESSION: Migraine without aura.  They appear to fluctuate in frequency  PLAN: Instead of topiramate, will start propranolol ER 60mg  daily She will follow up in 3 months but call with problems  15 minutes spent face to face with patient, over 50% spent discussing management.   Shon Millet, DO

## 2015-06-09 NOTE — Patient Instructions (Signed)
Migraine Recommendations: 1.  Stop topiramate.  Start propranolol ER 60mg  daily.  Call in 4 weeks with update and we can adjust dose if needed. 2.  Take ibuprofen for headache 3.  Limit use of pain relievers to no more than 2 days out of the week.  These medications include acetaminophen, ibuprofen, triptans and narcotics.  This will help reduce risk of rebound headaches. 4.  Be aware of common food triggers such as processed sweets, processed foods with nitrites (such as deli meat, hot dogs, sausages), foods with MSG, alcohol (such as wine), chocolate, certain cheeses, certain fruits (dried fruits, some citrus fruit), vinegar, diet soda. 4.  Avoid caffeine 5.  Routine exercise 6.  Proper sleep hygiene 7.  Stay adequately hydrated with water 8.  Keep a headache diary. 9.  Maintain proper stress management. 10.  Do not skip meals. 11.  Consider supplements:  Magnesium oxide 400mg  to 600mg  daily, riboflavin 400mg , Coenzyme Q 10 100mg  three times daily 12.  Follow up in 3 months but call in 4 weeks with update and for refill

## 2015-06-11 ENCOUNTER — Ambulatory Visit: Payer: Medicaid Other | Admitting: Neurology

## 2015-07-19 ENCOUNTER — Other Ambulatory Visit: Payer: Self-pay | Admitting: Neurology

## 2015-07-19 NOTE — Telephone Encounter (Signed)
Last OV: 06/09/15 Next OV: 09/22/15

## 2015-08-27 ENCOUNTER — Emergency Department
Admission: EM | Admit: 2015-08-27 | Discharge: 2015-08-27 | Disposition: A | Discharge status: Home / Self Care | Payer: Medicaid Other | Attending: Emergency Medicine | Admitting: Emergency Medicine

## 2015-08-27 ENCOUNTER — Encounter: Payer: Self-pay | Admitting: Emergency Medicine

## 2015-08-27 DIAGNOSIS — R05 Cough: Secondary | ICD-10-CM | POA: Diagnosis present

## 2015-08-27 DIAGNOSIS — J069 Acute upper respiratory infection, unspecified: Secondary | ICD-10-CM | POA: Diagnosis not present

## 2015-08-27 MED ORDER — AZITHROMYCIN 250 MG PO TABS: ORAL_TABLET | ORAL | Status: DC

## 2015-08-27 MED ORDER — CHLORPHENIRAMINE MALEATE 4 MG PO TABS: 4.0000 mg | ORAL_TABLET | Freq: Two times a day (BID) | ORAL | Status: DC | PRN

## 2015-08-27 MED ORDER — GUAIFENESIN-CODEINE 100-10 MG/5ML PO SOLN: 10.0000 mL | ORAL | Status: DC | PRN

## 2015-08-27 NOTE — ED Provider Notes (Signed)
[facility] Emergency Department Provider Note  ____________________________________________  Time seen: Approximately 10:47 PM  I have reviewed the triage vital signs and the nursing notes.   HISTORY  Chief Complaint Cough; Nasal Congestion; Otalgia; and Headache    HPI Jonita Albeeatalyia Aultman is a 20 y.o. female presents for evaluation of sinus congestion cough fever for 2 days. States that she is feeling a little bit better since Monday symptoms been ongoing for 5 days but she still started to feel bad. Has nonproductive. Has no relief with any over-the-counter medications. Has taken Tylenol ibuprofen as well. Level pain or discomfort is currently a 3/10.   Past Medical History  Diagnosis Date  . Headache     Patient Active Problem List   Diagnosis Date Noted  . Migraine with aura and without status migrainosus, not intractable 10/16/2014    History reviewed. No pertinent past surgical history.  Current Outpatient Rx  Name  Route  Sig  Dispense  Refill  . azithromycin (ZITHROMAX Z-PAK) 250 MG tablet      Take 2 tablets (500 mg) on  Day 1,  followed by 1 tablet (250 mg) once daily on Days 2 through 5.   6 each   0   . chlorpheniramine (CHLOR-TRIMETON) 4 MG tablet   Oral   Take 1 tablet (4 mg total) by mouth 2 (two) times daily as needed for allergies or rhinitis.   30 tablet   0   . guaiFENesin-codeine 100-10 MG/5ML syrup   Oral   Take 10 mLs by mouth every 4 (four) hours as needed for cough.   180 mL   0   . ibuprofen (ADVIL,MOTRIN) 200 MG tablet   Oral   Take 600 mg by mouth every 6 (six) hours as needed for headache.           Allergies Review of patient's allergies indicates no known allergies.  Family History  Problem Relation Age of Onset  . Hypertension Maternal Grandmother   . Osteoarthritis Maternal Grandmother   . Gout Maternal Grandmother   . Thyroid disease Maternal Grandmother   . Cancer Paternal Grandfather    unknown    Social History Social History  Substance Use Topics  . Smoking status: Never Smoker   . Smokeless tobacco: Never Used  . Alcohol Use: 0.0 oz/week    0 Standard drinks or equivalent per week    Review of Systems  Constitutional: Recent fever/chills Eyes: No visual changes. ENT: Positive sore throat. Positive nasal congestion with drainage. Cardiovascular: Denies chest pain. Respiratory: Denies shortness of breath. Positive for cough Musculoskeletal: Negative for back pain. Skin: Negative for rash. Neurological: Positive for headaches, negative for focal weakness or numbness.  10-point ROS otherwise negative.  ____________________________________________   PHYSICAL EXAM:  VITAL SIGNS: ED Triage Vitals  Enc Vitals Group     BP 08/27/15 2229 120/76 mmHg     Pulse --      Resp 08/27/15 2229 18     Temp 08/27/15 2229 98.9 F (37.2 C)     Temp Source 08/27/15 2229 Oral     SpO2 08/27/15 2229 100 %     Weight 08/27/15 2229 198 lb (89.812 kg)     Height 08/27/15 2229 5\' 5"  (1.651 m)     Head Cir --      Peak Flow --      Pain Score 08/27/15 2230 3     Pain Loc --      Pain Edu? --  Excl. in GC? --      Constitutional: Alert and oriented. Well appearing and in no acute distress. Head: Atraumatic. Nose: Positive congestion/rhinnorhea. Bilateral anterior turbinate swelling noted Mouth/Throat: Mucous membranes are moist.  Oropharynx non-erythematous. Neck: No stridor.  Full range of motion, nontender Cardiovascular: Normal rate, regular rhythm. Grossly normal heart sounds.  Good peripheral circulation. Respiratory: Normal respiratory effort.  No retractions. Lungs CTAB. Musculoskeletal: No lower extremity tenderness nor edema.  No joint effusions. Neurologic:  Normal speech and language. No gross focal neurologic deficits are appreciated. No gait instability. Skin:  Skin is warm, dry and intact. No rash noted. Psychiatric: Mood and affect are normal.  Speech and behavior are normal.  ____________________________________________   LABS (all labs ordered are listed, but only abnormal results are displayed)  Labs Reviewed - No data to display ____________________________________________    PROCEDURES  Procedure(s) performed: None  Critical Care performed: No  ____________________________________________   INITIAL IMPRESSION / ASSESSMENT AND PLAN / ED COURSE  Pertinent labs & imaging results that were available during my care of the patient were reviewed by me and considered in my medical decision making (see chart for details).  Acute upper respiratory infection. Rx given for Zithromax, cough medicine with codeine, chlorpheniramine and ibuprofen. Patient follow-up PCP or return to ER with any worsening symptomology. Work excuse given times one day ____________________________________________   FINAL CLINICAL IMPRESSION(S) / ED DIAGNOSES  Final diagnoses:  URI, acute     This chart was dictated using voice recognition software/Dragon. Despite best efforts to proofread, errors can occur which can change the meaning. Any change was purely unintentional.   Evangeline Dakin, PA-C 08/27/15 2315  Sharman Cheek, MD 08/27/15 [phone]

## 2015-08-27 NOTE — ED Notes (Addendum)
Pt says Monday night she started feeling bad with sinus congestion and cough; had fever for 2 days, none since Wednesday; pt says she's feeling better but she wants medication to "get rid of it";

## 2015-08-27 NOTE — Discharge Instructions (Signed)
Upper Respiratory Infection, Adult Most upper respiratory infections (URIs) are a viral infection of the air passages leading to the lungs. A URI affects the nose, throat, and upper air passages. The most common type of URI is nasopharyngitis and is typically referred to as "the common cold." URIs run their course and usually go away on their own. Most of the time, a URI does not require medical attention, but sometimes a bacterial infection in the upper airways can follow a viral infection. This is called a secondary infection. Sinus and middle ear infections are common types of secondary upper respiratory infections. Bacterial pneumonia can also complicate a URI. A URI can worsen asthma and chronic obstructive pulmonary disease (COPD). Sometimes, these complications can require emergency medical care and may be life threatening.  CAUSES Almost all URIs are caused by viruses. A virus is a type of germ and can spread from one person to another.  RISKS FACTORS You may be at risk for a URI if:   You smoke.   You have chronic heart or lung disease.  You have a weakened defense (immune) system.   You are very young or very old.   You have nasal allergies or asthma.  You work in crowded or poorly ventilated areas.  You work in health care facilities or schools. SIGNS AND SYMPTOMS  Symptoms typically develop 2-3 days after you come in contact with a cold virus. Most viral URIs last 7-10 days. However, viral URIs from the influenza virus (flu virus) can last 14-18 days and are typically more severe. Symptoms may include:   Runny or stuffy (congested) nose.   Sneezing.   Cough.   Sore throat.   Headache.   Fatigue.   Fever.   Loss of appetite.   Pain in your forehead, behind your eyes, and over your cheekbones (sinus pain).  Muscle aches.  DIAGNOSIS  Your health care provider may diagnose a URI by:  Physical exam.  Tests to check that your symptoms are not due to  another condition such as:  Strep throat.  Sinusitis.  Pneumonia.  Asthma. TREATMENT  A URI goes away on its own with time. It cannot be cured with medicines, but medicines may be prescribed or recommended to relieve symptoms. Medicines may help:  Reduce your fever.  Reduce your cough.  Relieve nasal congestion. HOME CARE INSTRUCTIONS   Take medicines only as directed by your health care provider.   Gargle warm saltwater or take cough drops to comfort your throat as directed by your health care provider.  Use a warm mist humidifier or inhale steam from a shower to increase air moisture. This may make it easier to breathe.  Drink enough fluid to keep your urine clear or pale yellow.   Eat soups and other clear broths and maintain good nutrition.   Rest as needed.   Return to work when your temperature has returned to normal or as your health care provider advises. You may need to stay home longer to avoid infecting others. You can also use a face mask and careful hand washing to prevent spread of the virus.  Increase the usage of your inhaler if you have asthma.   Do not use any tobacco products, including cigarettes, chewing tobacco, or electronic cigarettes. If you need help quitting, ask your health care provider. PREVENTION  The best way to protect yourself from getting a cold is to practice good hygiene.   Avoid oral or hand contact with people with cold   symptoms.   Wash your hands often if contact occurs.  There is no clear evidence that vitamin C, vitamin E, echinacea, or exercise reduces the chance of developing a cold. However, it is always recommended to get plenty of rest, exercise, and practice good nutrition.  SEEK MEDICAL CARE IF:   You are getting worse rather than better.   Your symptoms are not controlled by medicine.   You have chills.  You have worsening shortness of breath.  You have brown or red mucus.  You have yellow or brown nasal  discharge.  You have pain in your face, especially when you bend forward.  You have a fever.  You have swollen neck glands.  You have pain while swallowing.  You have white areas in the back of your throat. SEEK IMMEDIATE MEDICAL CARE IF:   You have severe or persistent:  Headache.  Ear pain.  Sinus pain.  Chest pain.  You have chronic lung disease and any of the following:  Wheezing.  Prolonged cough.  Coughing up blood.  A change in your usual mucus.  You have a stiff neck.  You have changes in your:  Vision.  Hearing.  Thinking.  Mood. MAKE SURE YOU:   Understand these instructions.  Will watch your condition.  Will get help right away if you are not doing well or get worse.   This information is not intended to replace advice given to you by your health care provider. Make sure you discuss any questions you have with your health care provider.   Document Released: 11/15/2000 Document Revised: 10/06/2014 Document Reviewed: 08/27/2013 Elsevier Interactive Patient Education 2016 [employer]  

## 2015-08-27 NOTE — ED Notes (Signed)
Pt reports sinus pressure and headaches since Monday.

## 2015-09-22 ENCOUNTER — Ambulatory Visit: Payer: Medicaid Other | Admitting: Neurology

## 2015-10-28 ENCOUNTER — Encounter: Payer: Self-pay | Admitting: Neurology

## 2015-10-28 ENCOUNTER — Ambulatory Visit (INDEPENDENT_AMBULATORY_CARE_PROVIDER_SITE_OTHER): Payer: Medicaid Other | Admitting: Neurology

## 2015-10-28 VITALS — BP 116/74 | HR 64 | Ht 65.0 in | Wt 201.0 lb

## 2015-10-28 DIAGNOSIS — G43109 Migraine with aura, not intractable, without status migrainosus: Secondary | ICD-10-CM | POA: Diagnosis not present

## 2015-10-28 MED ORDER — PROPRANOLOL HCL ER 60 MG PO CP24: 60.0000 mg | ORAL_CAPSULE | Freq: Every day | ORAL | Status: DC

## 2015-10-28 NOTE — Progress Notes (Signed)
NEUROLOGY FOLLOW UP OFFICE NOTE  Heather Albeeatalyia Holian [id number]  HISTORY OF PRESENT ILLNESS: Heather Noble is a 20 year old right-handed woman who follows up for migraine.  UPDATE: She is doing well.  Headaches are controlled. Intensity:  6/10 Duration:  30 minutes to 60 minutes Frequency:  2 days per month Current NSAIDS:  Advil Current analgesics:  no Current triptans:  no Current anti-emetic:  no Current muscle relaxants:  no Current anti-anxiolytic:  no Current sleep aide:  no Current Antihypertensive medications:  Propranolol ER 60mg  Current Antidepressant medications:  no Current Anticonvulsant medications:  no Current Vitamins/Herbal/Supplements:  no Current Antihistamines/Decongestants:  no Other therapy:  no Other medication:  no  HISTORY: Onset:  She has had migraines since highschool.  Two months ago, they increased but has since resolved for the past month Location:  Bi-temporal Quality:  pounding Initial Intensity:  10/10; September 8/10 Aura:  scotomas Prodrome:  none Associated symptoms:  Photophobia, phonophobia, osmophobia, fatigue.  Sometimes nausea Initial Duration:  8 hours; September 1 hour Initial Frequency:  In March, they were occuring daily (mostly at work).  Usually they occur 1-2 days per week; September 2 days per week Triggers/exacerbating factors:  Fast food, smoke Relieving factors:  Nap, water Activity:  Able to function  Past abortive therapy:  ibuprofen 600mg -800mg , Excedrin Migraine (effective) Past preventative therapy:  topiramate (never really took it appropriately so still an option)  She had a CT of the head performed in the ED on 09/09/14, which was unremarkable.  Family history of headache:  Mom.  No family history of aneurysms  PAST MEDICAL HISTORY: Past Medical History  Diagnosis Date  . Headache     MEDICATIONS: Current Outpatient Prescriptions on File Prior to Visit  Medication Sig Dispense Refill  . ibuprofen  (ADVIL,MOTRIN) 200 MG tablet Take 600 mg by mouth every 6 (six) hours as needed for headache.     No current facility-administered medications on file prior to visit.    ALLERGIES: No Known Allergies  FAMILY HISTORY: Family History  Problem Relation Age of Onset  . Hypertension Maternal Grandmother   . Osteoarthritis Maternal Grandmother   . Gout Maternal Grandmother   . Thyroid disease Maternal Grandmother   . Cancer Paternal Grandfather     unknown    SOCIAL HISTORY: Social History   Social History  . Marital Status: Single    Spouse Name: N/A  . Number of Children: N/A  . Years of Education: N/A   Occupational History  . Not on file.   Social History Main Topics  . Smoking status: Never Smoker   . Smokeless tobacco: Never Used  . Alcohol Use: 0.0 oz/week    0 Standard drinks or equivalent per week  . Drug Use: No  . Sexual Activity:    Partners: Male   Other Topics Concern  . Not on file   Social History Narrative    REVIEW OF SYSTEMS: Constitutional: No fevers, chills, or sweats, no generalized fatigue, change in appetite Eyes: No visual changes, double vision, eye pain Ear, nose and throat: No hearing loss, ear pain, nasal congestion, sore throat Cardiovascular: No chest pain, palpitations Respiratory:  No shortness of breath at rest or with exertion, wheezes GastrointestinaI: No nausea, vomiting, diarrhea, abdominal pain, fecal incontinence Genitourinary:  No dysuria, urinary retention or frequency Musculoskeletal:  No neck pain, back pain Integumentary: No rash, pruritus, skin lesions Neurological: as above Psychiatric: No depression, insomnia, anxiety Endocrine: No palpitations, fatigue, diaphoresis, mood swings, change  in appetite, change in weight, increased thirst Hematologic/Lymphatic:  No purpura, petechiae. Allergic/Immunologic: no itchy/runny eyes, nasal congestion, recent allergic reactions, rashes  PHYSICAL EXAM: Filed Vitals:    10/28/15 1126  BP: 116/74  Pulse: 64   General: No acute distress.  Patient appears well-groomed.   Head:  Normocephalic/atraumatic Eyes:  Fundi examined but not visualized Neck: supple, no paraspinal tenderness, full range of motion Heart:  Regular rate and rhythm Lungs:  Clear to auscultation bilaterally Back: No paraspinal tenderness Neurological Exam: alert and oriented to person, place, and time. Attention span and concentration intact, recent and remote memory intact, fund of knowledge intact.  Speech fluent and not dysarthric, language intact.  CN II-XII intact. Bulk and tone normal, muscle strength 5/5 throughout.  Sensation to light touch, temperature and vibration intact.  Deep tendon reflexes 2+ throughout, toes downgoing.  Finger to nose and heel to shin testing intact.  Gait normal, Romberg negative.  IMPRESSION: Migraine with aura, stable  PLAN: Propranolol ER  daily Advil as needed Follow up in one year or as needed  15 minutes spent face to face with patient, over 50% spent discussing management.  Shon Millet, DO

## 2015-10-28 NOTE — Patient Instructions (Signed)
1.  Continue propranolol ER 60mg  daily 2.  Use Advil as needed 3.  Follow up in one year.

## 2015-12-31 ENCOUNTER — Encounter (HOSPITAL_COMMUNITY): Payer: Self-pay | Admitting: *Deleted

## 2015-12-31 ENCOUNTER — Emergency Department (HOSPITAL_COMMUNITY)
Admission: EM | Admit: 2015-12-31 | Discharge: 2016-01-01 | Disposition: A | Discharge status: Home / Self Care | Payer: Medicaid Other | Attending: Emergency Medicine | Admitting: Emergency Medicine

## 2015-12-31 DIAGNOSIS — Z5321 Procedure and treatment not carried out due to patient leaving prior to being seen by health care provider: Secondary | ICD-10-CM | POA: Diagnosis not present

## 2015-12-31 DIAGNOSIS — R1084 Generalized abdominal pain: Secondary | ICD-10-CM | POA: Diagnosis not present

## 2015-12-31 LAB — COMPREHENSIVE METABOLIC PANEL
ALT: 12 U/L — AB (ref 14–54)
AST: 17 U/L (ref 15–41)
Albumin: 3.8 g/dL (ref 3.5–5.0)
Alkaline Phosphatase: 61 U/L (ref 38–126)
Anion gap: 8 (ref 5–15)
BILIRUBIN TOTAL: 0.7 mg/dL (ref 0.3–1.2)
BUN: 6 mg/dL (ref 6–20)
CHLORIDE: 103 mmol/L (ref 101–111)
CO2: 24 mmol/L (ref 22–32)
CREATININE: 0.77 mg/dL (ref 0.44–1.00)
Calcium: 9.2 mg/dL (ref 8.9–10.3)
Glucose, Bld: 91 mg/dL (ref 65–99)
Potassium: 3.9 mmol/L (ref 3.5–5.1)
Sodium: 135 mmol/L (ref 135–145)
TOTAL PROTEIN: 7.9 g/dL (ref 6.5–8.1)

## 2015-12-31 LAB — URINE MICROSCOPIC-ADD ON

## 2015-12-31 LAB — I-STAT BETA HCG BLOOD, ED (MC, WL, AP ONLY): I-stat hCG, quantitative: <5 m[IU]/mL (ref <5)

## 2015-12-31 LAB — URINALYSIS, ROUTINE W REFLEX MICROSCOPIC
Bilirubin Urine: NEGATIVE
GLUCOSE, UA: NEGATIVE mg/dL
KETONES UR: NEGATIVE mg/dL
NITRITE: NEGATIVE
PROTEIN: 100 mg/dL — AB
Specific Gravity, Urine: 1.011 (ref 1.005–1.030)
pH: 7 (ref 5.0–8.0)

## 2015-12-31 LAB — CBC
HCT: 32.3 % — ABNORMAL LOW (ref 36.0–46.0)
Hemoglobin: 10.3 g/dL — ABNORMAL LOW (ref 12.0–15.0)
MCH: 28.5 pg (ref 26.0–34.0)
MCHC: 31.9 g/dL (ref 30.0–36.0)
MCV: 89.2 fL (ref 78.0–100.0)
PLATELETS: 408 10*3/uL — AB (ref 150–400)
RBC: 3.62 MIL/uL — AB (ref 3.87–5.11)
RDW: 12 % (ref 11.5–15.5)
WBC: 10.5 10*3/uL (ref 4.0–10.5)

## 2015-12-31 LAB — LIPASE, BLOOD: LIPASE: 17 U/L (ref 11–51)

## 2015-12-31 NOTE — ED Triage Notes (Signed)
Pt reports generalized abd pain since 2:00. Having n/v and reports pain with urination. Denies diarrhea.

## 2015-12-31 NOTE — ED Notes (Signed)
Called to recheck vitals. Pt did not answer.

## 2016-01-26 ENCOUNTER — Encounter (HOSPITAL_COMMUNITY): Payer: Self-pay | Admitting: Neurology

## 2016-01-26 ENCOUNTER — Emergency Department (HOSPITAL_COMMUNITY)
Admission: EM | Admit: 2016-01-26 | Discharge: 2016-01-26 | Disposition: A | Discharge status: Home / Self Care | Payer: Medicaid Other | Attending: Dermatology | Admitting: Dermatology

## 2016-01-26 DIAGNOSIS — R51 Headache: Secondary | ICD-10-CM | POA: Insufficient documentation

## 2016-01-26 DIAGNOSIS — Z5321 Procedure and treatment not carried out due to patient leaving prior to being seen by health care provider: Secondary | ICD-10-CM | POA: Insufficient documentation

## 2016-01-26 NOTE — ED Notes (Signed)
Pt called for room x 3 without answer.

## 2016-01-26 NOTE — ED Notes (Signed)
Patient called in main ED waiting area with no response 

## 2016-01-26 NOTE — ED Notes (Signed)
Patient called again x2 in main ED waiting area with no response

## 2016-01-26 NOTE — ED Triage Notes (Signed)
Pt reports h/a x several weeks with hx of migraines. Lives in an appt that she thinks has mold. Thinks this might be her trigger for migraines. Pt is a x 4. In NAD.

## 2016-03-10 ENCOUNTER — Encounter: Payer: Self-pay | Admitting: Neurology

## 2016-08-30 ENCOUNTER — Encounter (HOSPITAL_COMMUNITY): Payer: Self-pay | Admitting: Emergency Medicine

## 2016-08-30 ENCOUNTER — Emergency Department (HOSPITAL_COMMUNITY)
Admission: EM | Admit: 2016-08-30 | Discharge: 2016-08-30 | Disposition: A | Discharge status: Home / Self Care | Payer: Medicaid Other | Attending: Dermatology | Admitting: Dermatology

## 2016-08-30 ENCOUNTER — Ambulatory Visit (HOSPITAL_COMMUNITY)
Admission: EM | Admit: 2016-08-30 | Discharge: 2016-08-30 | Disposition: A | Discharge status: Home / Self Care | Payer: Self-pay | Attending: Family Medicine | Admitting: Family Medicine

## 2016-08-30 DIAGNOSIS — Z5321 Procedure and treatment not carried out due to patient leaving prior to being seen by health care provider: Secondary | ICD-10-CM | POA: Insufficient documentation

## 2016-08-30 DIAGNOSIS — Z3201 Encounter for pregnancy test, result positive: Secondary | ICD-10-CM

## 2016-08-30 DIAGNOSIS — R1030 Lower abdominal pain, unspecified: Secondary | ICD-10-CM | POA: Insufficient documentation

## 2016-08-30 DIAGNOSIS — N926 Irregular menstruation, unspecified: Secondary | ICD-10-CM

## 2016-08-30 LAB — POCT PREGNANCY, URINE: PREG TEST UR: POSITIVE — AB

## 2016-08-30 LAB — POC URINE PREG, ED: Preg Test, Ur: POSITIVE — AB

## 2016-08-30 MED ORDER — PRENATAL VITAMINS 28-0.8 MG PO TABS: ORAL_TABLET | ORAL | 0 refills | Status: DC

## 2016-08-30 NOTE — ED Triage Notes (Signed)
[facility] to request a urine pregnancy test. The patient stated that she missed her march menstrual cycle.

## 2016-08-30 NOTE — ED Triage Notes (Signed)
Pt presents to ER requesting preg test; LMP 2/10 est. Pt c/o lower suprapubic abd pain described as cramping and sharp; pt also states urinary frequency

## 2016-08-30 NOTE — ED Provider Notes (Signed)
CSN: [id number]     Arrival date & time 08/30/16  1437 History   First MD Initiated Contact with Patient 08/30/16 1550     Chief Complaint  Patient presents with  . Possible Pregnancy   (Consider location/radiation/quality/duration/timing/severity/associated sxs/prior Treatment) 21 year old female states her LMP was February 4 of this year. She wants a pregnancy test. She is having no symptoms. No pelvic pain no bleeding.      Past Medical History:  Diagnosis Date  . Headache    History reviewed. No pertinent surgical history. Family History  Problem Relation Age of Onset  . Hypertension Maternal Grandmother   . Osteoarthritis Maternal Grandmother   . Gout Maternal Grandmother   . Thyroid disease Maternal Grandmother   . Cancer Paternal Grandfather     unknown   Social History  Substance Use Topics  . Smoking status: Never Smoker  . Smokeless tobacco: Never Used  . Alcohol use Yes     Comment: occ   OB History    No data available     Review of Systems  Constitutional: Negative.   Genitourinary:       Amenorrhea  Psychiatric/Behavioral: Negative.   All other systems reviewed and are negative.   Allergies  Patient has no known allergies.  Home Medications   Prior to Admission medications   Medication Sig Start Date End Date Taking? Authorizing Provider  Prenatal Vit-Fe Fumarate-FA (PRENATAL VITAMINS) 28-0.8 MG TABS One q d 08/30/16   Hayden Rasmussen, NP   Meds Ordered and Administered this Visit  Medications - No data to display  BP (!) 133/107 (BP Location: Right Arm)   Pulse 73   Temp 98.5 F (36.9 C) (Oral)   Resp 16   LMP 07/09/2016 (Exact Date)   SpO2 100%  No data found.   Physical Exam  Constitutional: She is oriented to person, place, and time. She appears well-developed and well-nourished. No distress.  Neck: Neck supple.  Cardiovascular: Normal rate.   Pulmonary/Chest: Effort normal.  Musculoskeletal: She exhibits no edema.  Neurological:  She is alert and oriented to person, place, and time.  Skin: Skin is warm and dry.  Psychiatric: She has a normal mood and affect.  Nursing note and vitals reviewed.   Urgent Care Course     Procedures (including critical care time)  Labs Review Labs Reviewed  POCT PREGNANCY, URINE - Abnormal; Notable for the following:       Result Value   Preg Test, Ur POSITIVE (*)    All other components within normal limits    Imaging Review No results found.   Visual Acuity Review  Right Eye Distance:   Left Eye Distance:   Bilateral Distance:    Right Eye Near:   Left Eye Near:    Bilateral Near:         MDM   1. Positive pregnancy test    Call the number on this page to set up an appointment with the obstetrician. Take the prenatal vitamins as directed. If you develop any problems such as pelvic pain or bleeding [facility] if you are over 12 weeks and the [facility] emergency department if you are less than [redacted] weeks pregnant. Meds ordered this encounter  Medications  . Prenatal Vit-Fe Fumarate-FA (PRENATAL VITAMINS) 28-0.8 MG TABS    Sig: One q d    Dispense:  100 tablet    Refill:  0    Order Specific Question:   Supervising Provider  AnswerLonia Blood:   LAUENSTEIN, KURT [[id number]]        Hayden Rasmussenavid Dayane Hillenburg, NP 08/30/16 1603    Hayden Rasmussenavid Aariv Medlock, NP 08/30/16 [phone]

## 2016-08-30 NOTE — ED Notes (Signed)
Clean catch urine.

## 2016-08-30 NOTE — ED Notes (Signed)
Pt stated she was leaving due to the long wait time.

## 2016-08-30 NOTE — Discharge Instructions (Signed)
Call the number on this page to set up an appointment with the obstetrician. Take the prenatal vitamins as directed. If you develop any problems such as pelvic pain or bleeding [facility] if you are over 12 weeks and the [facility] emergency department if you are less than [redacted] weeks pregnant.

## 2016-10-16 ENCOUNTER — Ambulatory Visit (INDEPENDENT_AMBULATORY_CARE_PROVIDER_SITE_OTHER): Payer: Medicaid Other | Admitting: Certified Nurse Midwife

## 2016-10-16 ENCOUNTER — Other Ambulatory Visit (HOSPITAL_COMMUNITY)
Admission: RE | Admit: 2016-10-16 | Discharge: 2016-10-16 | Disposition: A | Discharge status: Home / Self Care | Payer: Medicaid Other | Source: Ambulatory Visit | Attending: Certified Nurse Midwife | Admitting: Certified Nurse Midwife

## 2016-10-16 ENCOUNTER — Encounter: Payer: Self-pay | Admitting: Certified Nurse Midwife

## 2016-10-16 VITALS — BP 123/84 | HR 82 | Wt 202.0 lb

## 2016-10-16 DIAGNOSIS — Z3A00 Weeks of gestation of pregnancy not specified: Secondary | ICD-10-CM | POA: Diagnosis not present

## 2016-10-16 DIAGNOSIS — O219 Vomiting of pregnancy, unspecified: Secondary | ICD-10-CM

## 2016-10-16 DIAGNOSIS — Z3402 Encounter for supervision of normal first pregnancy, second trimester: Secondary | ICD-10-CM

## 2016-10-16 DIAGNOSIS — Z98891 History of uterine scar from previous surgery: Secondary | ICD-10-CM | POA: Insufficient documentation

## 2016-10-16 DIAGNOSIS — Z3400 Encounter for supervision of normal first pregnancy, unspecified trimester: Secondary | ICD-10-CM | POA: Insufficient documentation

## 2016-10-16 MED ORDER — DOXYLAMINE-PYRIDOXINE ER 20-20 MG PO TBCR: 1.0000 | EXTENDED_RELEASE_TABLET | Freq: Two times a day (BID) | ORAL | 6 refills | Status: DC

## 2016-10-16 MED ORDER — PRENATE PIXIE 10-0.6-0.4-200 MG PO CAPS: 1.0000 | ORAL_CAPSULE | Freq: Every day | ORAL | 12 refills | Status: DC

## 2016-10-16 NOTE — Progress Notes (Signed)
Patient is in the office for initial ob visit, denies pain and bleeding.

## 2016-10-16 NOTE — Progress Notes (Signed)
Subjective:    Heather Noble is being seen today for her first obstetrical visit.  This is a planned pregnancy. She is at [redacted]w[redacted]d gestation. Her obstetrical history is significant for obesity and late prenatal care @14  weeks. Relationship with FOB: significant other, living together. Patient does intend to breast feed. Pregnancy history fully reviewed.  The information documented in the HPI was reviewed and verified.  Menstrual History: OB History    Gravida Para Term Preterm AB Living   1             SAB TAB Ectopic Multiple Live Births                   Patient's last menstrual period was 07/09/2016.    Past Medical History:  Diagnosis Date  . Headache     History reviewed. No pertinent surgical history.   (Not in a hospital admission) No Known Allergies  Social History  Substance Use Topics  . Smoking status: Never Smoker  . Smokeless tobacco: Never Used  . Alcohol use No     Comment: occ    Family History  Problem Relation Age of Onset  . Hypertension Maternal Grandmother   . Osteoarthritis Maternal Grandmother   . Gout Maternal Grandmother   . Thyroid disease Maternal Grandmother   . Diabetes Maternal Grandmother   . Cancer Paternal Grandfather        unknown     Review of Systems Constitutional: negative for weight loss Gastrointestinal: negative for vomiting Genitourinary:negative for genital lesions and vaginal discharge and dysuria Musculoskeletal:negative for back pain Behavioral/Psych: negative for abusive relationship, depression, illegal drug usage and tobacco use    Objective:    BP 123/84   Pulse 82   Wt 202 lb (91.6 kg)   LMP 07/09/2016   BMI 33.61 kg/m  General Appearance:    Alert, cooperative, no distress, appears stated age  Head:    Normocephalic, without obvious abnormality, atraumatic  Eyes:    PERRL, conjunctiva/corneas clear, EOM's intact, fundi    benign, both eyes  Ears:    Normal TM's and external ear canals, both ears   Nose:   Nares normal, septum midline, mucosa normal, no drainage    or sinus tenderness  Throat:   Lips, mucosa, and tongue normal; teeth and gums normal  Neck:   Supple, symmetrical, trachea midline, no adenopathy;    thyroid:  no enlargement/tenderness/nodules; no carotid   bruit or JVD  Back:     Symmetric, no curvature, ROM normal, no CVA tenderness  Lungs:     Clear to auscultation bilaterally, respirations unlabored  Chest Wall:    No tenderness or deformity   Heart:    Regular rate and rhythm, S1 and S2 normal, no murmur, rub   or gallop  Breast Exam:    No tenderness, masses, or nipple abnormality  Abdomen:     Soft, non-tender, bowel sounds active all four quadrants,    no masses, no organomegaly  Genitalia:    Normal female without lesion, discharge or tenderness  Extremities:   Extremities normal, atraumatic, no cyanosis or edema  Pulses:   2+ and symmetric all extremities  Skin:   Skin color, texture, turgor normal, no rashes or lesions  Lymph nodes:   Cervical, supraclavicular, and axillary nodes normal  Neurologic:   CNII-XII intact, normal strength, sensation and reflexes    throughout            Cervix:  Long, thick, closed and  posterior.  FRH: 138 by doppler.  FH: less than U.  Lab Review Urine pregnancy test Labs reviewed yes Radiologic studies reviewed no Assessment & Plan    Pregnancy at 8229w1d weeks   1. Supervision of normal first pregnancy, antepartum     - Cytology - PAP - Cervicovaginal ancillary only - Hemoglobin A1c - Hemoglobinopathy evaluation - Vitamin D (25 hydroxy) - Obstetric Panel, Including HIV - Culture, OB Urine - MaterniT21 PLUS Core+SCA - US MFM OB COMP + 14 WK; Future - Inheritest Society Guided - Prenat-FeAsp-Meth-FA-DHA w/o A (PRENATE PIXIE) 10-0.6-0.4-200 MG CAPS; Take 1 tablet by mouth daily.  Dispense: 30 capsule; Refill: 12  2. Nausea and vomiting during pregnancy prior to [redacted] weeks gestation       - Doxylamine-Pyridoxine ER  (BONJESTA) 20-20 MG TBCR; Take 1 tablet by mouth 2 (two) times daily.  Dispense: 60 tablet; Refill: 6  ?Linden DolinWaterbirth, information given.  Counseling provided regarding continued use of seat belts, cessation of alcohol consumption, smoking or use of illicit drugs; infection precautions i.e., influenza/TDAP immunizations, toxoplasmosis,CMV, parvovirus, listeria and varicella; workplace safety, exercise during pregnancy; routine dental care, safe medications, sexual activity, hot tubs, saunas, pools, travel, caffeine use, fish and methlymercury, potential toxins, hair treatments, varicose veins Weight gain recommendations per IOM guidelines reviewed: underweight/BMI< 18.5--> gain 28 - 40 lbs; normal weight/BMI 18.5 - 24.9--> gain 25 - 35 lbs; overweight/BMI 25 - 29.9--> gain 15 - 25 lbs; obese/BMI >30->gain  11 - 20 lbs Problem list reviewed and updated. FIRST/CF mutation testing/NIPT/QUAD SCREEN/fragile X/Ashkenazi Jewish population testing/Spinal muscular atrophy discussed: ordered. Role of ultrasound in pregnancy discussed; fetal survey: ordered. Amniocentesis discussed: not indicated.  Meds ordered this encounter  Medications  . Prenat-FeAsp-Meth-FA-DHA w/o A (PRENATE PIXIE) 10-0.6-0.4-200 MG CAPS    Sig: Take 1 tablet by mouth daily.    Dispense:  30 capsule    Refill:  12    Please process coupon: Rx BIN: [id number], RxPCN: [id number], RxGRP: [id number]: [id number], Rx: [id number]: [id number]  SUF: 01  . Doxylamine-Pyridoxine ER (BONJESTA) 20-20 MG TBCR    Sig: Take 1 tablet by mouth 2 (two) times daily.    Dispense:  60 tablet    Refill:  6   Orders Placed This Encounter  Procedures  . Culture, OB Urine  . US MFM OB COMP + 14 WK    Standing Status:   Future    Standing Expiration Date:   12/16/2017    Order Specific Question:   Reason for Exam (SYMPTOM  OR DIAGNOSIS REQUIRED)    Answer:   fetal anatomy scan    Order Specific Question:   Preferred imaging location?    Answer:   [facility]-Ul[facility]  . Hemoglobin A1c  .  Hemoglobinopathy evaluation  . Vitamin D (25 hydroxy)  . Obstetric Panel, Including HIV  . MaterniT21 PLUS Core+SCA    Order Specific Question:   Gestational Age (GA), weeks    Answer:   3114    Order Specific Question:   Date on which patient was at this GA    Answer:   10/16/2016    Order Specific Question:   GA Calculation Method    Answer:   LMP  . Inheritest Society Guided    Follow up in 4 weeks. 50% of 30 min visit spent on counseling and coordination of care.

## 2016-10-17 LAB — CERVICOVAGINAL ANCILLARY ONLY
BACTERIAL VAGINITIS: POSITIVE — AB
CANDIDA VAGINITIS: NEGATIVE
Chlamydia: NEGATIVE
NEISSERIA GONORRHEA: NEGATIVE
TRICH (WINDOWPATH): NEGATIVE

## 2016-10-17 LAB — CYTOLOGY - PAP: Diagnosis: NEGATIVE

## 2016-10-18 ENCOUNTER — Other Ambulatory Visit: Payer: Self-pay | Admitting: Certified Nurse Midwife

## 2016-10-18 DIAGNOSIS — B9689 Other specified bacterial agents as the cause of diseases classified elsewhere: Secondary | ICD-10-CM

## 2016-10-18 DIAGNOSIS — N760 Acute vaginitis: Principal | ICD-10-CM

## 2016-10-18 LAB — URINE CULTURE, OB REFLEX

## 2016-10-18 LAB — CULTURE, OB URINE

## 2016-10-18 MED ORDER — METRONIDAZOLE 0.75 % VA GEL: 1.0000 | Freq: Two times a day (BID) | VAGINAL | 0 refills | Status: DC

## 2016-10-27 ENCOUNTER — Ambulatory Visit: Payer: Medicaid Other | Admitting: Neurology

## 2016-10-31 ENCOUNTER — Ambulatory Visit: Payer: Medicaid Other | Admitting: Neurology

## 2016-11-03 ENCOUNTER — Telehealth: Payer: Self-pay

## 2016-11-03 ENCOUNTER — Other Ambulatory Visit: Payer: Self-pay | Admitting: Certified Nurse Midwife

## 2016-11-03 DIAGNOSIS — O99019 Anemia complicating pregnancy, unspecified trimester: Secondary | ICD-10-CM

## 2016-11-03 DIAGNOSIS — O99012 Anemia complicating pregnancy, second trimester: Secondary | ICD-10-CM

## 2016-11-03 DIAGNOSIS — Z2839 Other underimmunization status: Secondary | ICD-10-CM | POA: Insufficient documentation

## 2016-11-03 DIAGNOSIS — D573 Sickle-cell trait: Secondary | ICD-10-CM | POA: Insufficient documentation

## 2016-11-03 DIAGNOSIS — Z283 Underimmunization status: Secondary | ICD-10-CM | POA: Insufficient documentation

## 2016-11-03 DIAGNOSIS — O99013 Anemia complicating pregnancy, third trimester: Secondary | ICD-10-CM | POA: Insufficient documentation

## 2016-11-03 DIAGNOSIS — R7989 Other specified abnormal findings of blood chemistry: Secondary | ICD-10-CM | POA: Insufficient documentation

## 2016-11-03 DIAGNOSIS — Z3400 Encounter for supervision of normal first pregnancy, unspecified trimester: Secondary | ICD-10-CM

## 2016-11-03 DIAGNOSIS — O09899 Supervision of other high risk pregnancies, unspecified trimester: Secondary | ICD-10-CM

## 2016-11-03 DIAGNOSIS — O9989 Other specified diseases and conditions complicating pregnancy, childbirth and the puerperium: Secondary | ICD-10-CM

## 2016-11-03 HISTORY — DX: Sickle-cell trait: D57.3

## 2016-11-03 HISTORY — DX: Anemia complicating pregnancy, unspecified trimester: O99.019

## 2016-11-03 LAB — OBSTETRIC PANEL, INCLUDING HIV
ANTIBODY SCREEN: NEGATIVE
Basophils Absolute: 0 10*3/uL (ref 0.0–0.2)
Basos: 0 %
EOS (ABSOLUTE): 0 10*3/uL (ref 0.0–0.4)
Eos: 0 %
HIV SCREEN 4TH GENERATION: NONREACTIVE
Hematocrit: 29.4 % — ABNORMAL LOW (ref 34.0–46.6)
Hemoglobin: 9.5 g/dL — ABNORMAL LOW (ref 11.1–15.9)
Hepatitis B Surface Ag: NEGATIVE
Immature Grans (Abs): 0 10*3/uL (ref 0.0–0.1)
Immature Granulocytes: 0 %
LYMPHS ABS: 1.7 10*3/uL (ref 0.7–3.1)
Lymphs: 30 %
MCH: 29.7 pg (ref 26.6–33.0)
MCHC: 32.3 g/dL (ref 31.5–35.7)
MCV: 92 fL (ref 79–97)
MONOS ABS: 0.7 10*3/uL (ref 0.1–0.9)
Monocytes: 12 %
NEUTROS ABS: 3.4 10*3/uL (ref 1.4–7.0)
Neutrophils: 58 %
Platelets: 370 10*3/uL (ref 150–379)
RBC: 3.2 x10E6/uL — AB (ref 3.77–5.28)
RDW: 13.2 % (ref 12.3–15.4)
RPR Ser Ql: NONREACTIVE
Rh Factor: POSITIVE
Rubella Antibodies, IGG: <0.9 index — ABNORMAL LOW (ref >0.99)
WBC: 5.9 10*3/uL (ref 3.4–10.8)

## 2016-11-03 LAB — MATERNIT21 PLUS CORE+SCA
CHROMOSOME 13: NEGATIVE
CHROMOSOME 18: NEGATIVE
CHROMOSOME 21: NEGATIVE
Y Chromosome: DETECTED

## 2016-11-03 LAB — HEMOGLOBINOPATHY EVALUATION
HEMOGLOBIN F QUANTITATION: 0.5 % (ref 0.0–2.0)
HGB C: 0 %
HGB S: 40.8 % — AB
HGB VARIANT: 0 %
Hemoglobin A2 Quantitation: 4.2 % — ABNORMAL HIGH (ref 1.8–3.2)
Hgb A: 54.5 % — ABNORMAL LOW (ref 96.4–98.8)

## 2016-11-03 LAB — INHERITEST SOCIETY GUIDED

## 2016-11-03 LAB — HEMOGLOBIN A1C
ESTIMATED AVERAGE GLUCOSE: 91 mg/dL
Hgb A1c MFr Bld: 4.8 % (ref 4.8–5.6)

## 2016-11-03 LAB — VITAMIN D 25 HYDROXY (VIT D DEFICIENCY, FRACTURES): Vit D, 25-Hydroxy: 15.8 ng/mL — ABNORMAL LOW (ref 30.0–100.0)

## 2016-11-03 MED ORDER — VITAMIN D (ERGOCALCIFEROL) 1.25 MG (50000 UNIT) PO CAPS
50000.0000 [IU] | ORAL_CAPSULE | ORAL | 2 refills | Status: DC
Start: 2016-11-03 — End: 2017-02-01

## 2016-11-03 MED ORDER — CITRANATAL BLOOM 90-1 MG PO TABS: 1.0000 | ORAL_TABLET | Freq: Every day | ORAL | 12 refills | Status: DC

## 2016-11-03 NOTE — Telephone Encounter (Signed)
Left vm for patient to call regarding lab results and rx sent.

## 2016-11-06 ENCOUNTER — Encounter: Payer: Self-pay | Admitting: Neurology

## 2016-11-09 ENCOUNTER — Encounter (HOSPITAL_COMMUNITY): Payer: Self-pay | Admitting: Certified Nurse Midwife

## 2016-11-13 ENCOUNTER — Encounter: Payer: Medicaid Other | Admitting: Certified Nurse Midwife

## 2016-11-16 ENCOUNTER — Ambulatory Visit (HOSPITAL_COMMUNITY)
Admission: RE | Admit: 2016-11-16 | Discharge: 2016-11-16 | Disposition: A | Discharge status: Home / Self Care | Payer: Medicaid Other | Source: Ambulatory Visit | Attending: Certified Nurse Midwife | Admitting: Certified Nurse Midwife

## 2016-11-16 ENCOUNTER — Ambulatory Visit (INDEPENDENT_AMBULATORY_CARE_PROVIDER_SITE_OTHER): Payer: Medicaid Other | Admitting: Certified Nurse Midwife

## 2016-11-16 ENCOUNTER — Other Ambulatory Visit: Payer: Self-pay | Admitting: Certified Nurse Midwife

## 2016-11-16 ENCOUNTER — Encounter: Payer: Self-pay | Admitting: Certified Nurse Midwife

## 2016-11-16 VITALS — BP 129/77 | HR 78 | Wt 204.0 lb

## 2016-11-16 DIAGNOSIS — Z3400 Encounter for supervision of normal first pregnancy, unspecified trimester: Secondary | ICD-10-CM

## 2016-11-16 DIAGNOSIS — Z3402 Encounter for supervision of normal first pregnancy, second trimester: Secondary | ICD-10-CM

## 2016-11-16 DIAGNOSIS — Z3689 Encounter for other specified antenatal screening: Secondary | ICD-10-CM | POA: Insufficient documentation

## 2016-11-16 DIAGNOSIS — D573 Sickle-cell trait: Secondary | ICD-10-CM | POA: Insufficient documentation

## 2016-11-16 DIAGNOSIS — N760 Acute vaginitis: Secondary | ICD-10-CM

## 2016-11-16 DIAGNOSIS — Z283 Underimmunization status: Secondary | ICD-10-CM

## 2016-11-16 DIAGNOSIS — O9989 Other specified diseases and conditions complicating pregnancy, childbirth and the puerperium: Secondary | ICD-10-CM

## 2016-11-16 DIAGNOSIS — R7989 Other specified abnormal findings of blood chemistry: Secondary | ICD-10-CM

## 2016-11-16 DIAGNOSIS — O99012 Anemia complicating pregnancy, second trimester: Secondary | ICD-10-CM

## 2016-11-16 DIAGNOSIS — O09899 Supervision of other high risk pregnancies, unspecified trimester: Secondary | ICD-10-CM

## 2016-11-16 DIAGNOSIS — Z862 Personal history of diseases of the blood and blood-forming organs and certain disorders involving the immune mechanism: Secondary | ICD-10-CM | POA: Insufficient documentation

## 2016-11-16 DIAGNOSIS — B9689 Other specified bacterial agents as the cause of diseases classified elsewhere: Secondary | ICD-10-CM

## 2016-11-16 MED ORDER — METRONIDAZOLE 500 MG PO TABS: 500.0000 mg | ORAL_TABLET | Freq: Two times a day (BID) | ORAL | 0 refills | Status: DC

## 2016-11-16 NOTE — Progress Notes (Signed)
   PRENATAL VISIT NOTE  Subjective:  Heather Noble is a 21 y.o. G1P0 at 62w4dbeing seen today for ongoing prenatal care.  She is currently monitored for the following issues for this low-risk pregnancy and has Migraine with aura and without status migrainosus, not intractable; Supervision of normal first pregnancy, antepartum; Sickle cell trait ([place]; Low vitamin D level; Rubella non-immune status, antepartum; and Anemia affecting pregnancy in second trimester on her problem list.  Patient reports no complaints.  Contractions: Not present. Vag. Bleeding: None.   . Denies leaking of fluid.   The following portions of the patient's history were reviewed and updated as appropriate: allergies, current medications, past family history, past medical history, past social history, past surgical history and problem list. Problem list updated.  Objective:   Vitals:   11/16/16 1015  BP: 129/77  Pulse: 78  Weight: 204 lb (92.5 kg)    Fetal Status: Fetal Heart Rate (bpm): 140 Fundal Height: 20 cm       General:  Alert, oriented and cooperative. Patient is in no acute distress.  Skin: Skin is warm and dry. No rash noted.   Cardiovascular: Normal heart rate noted  Respiratory: Normal respiratory effort, no problems with respiration noted  Abdomen: Soft, gravid, appropriate for gestational age. Pain/Pressure: Absent     Pelvic:  Cervical exam deferred        Extremities: Normal range of motion.  Edema: None  Mental Status: Normal mood and affect. Normal behavior. Normal judgment and thought content.   Assessment and Plan:  Pregnancy: G1P0 at 189w4d1. Supervision of normal first pregnancy, antepartum     Doing well.  Has [place]ore[place]uled today for fetal anatomy scan.  - AFP, Serum, Open Spina Bifida  2. Low vitamin D level     Taking weekly vitamin D  3. Rubella non-immune status, antepartum      MMR postpartum  4. Anemia affecting pregnancy in second trimester     Taking bloom  5. BV  (bacterial vaginosis)     States that she is scared to use metrogel and prefers tablets.  - metroNIDAZOLE (FLAGYL) 500 MG tablet; Take 1 tablet (500 mg total) by mouth 2 (two) times daily.  Dispense: 14 tablet; Refill: 0  Preterm labor symptoms and general obstetric precautions including but not limited to vaginal bleeding, contractions, leaking of fluid and fetal movement were reviewed in detail with the patient. Please refer to After Visit Summary for other counseling recommendations.  Return in about 4 weeks (around 12/14/2016) for ROB.   RaMorene CrockerCNM

## 2016-11-22 ENCOUNTER — Other Ambulatory Visit: Payer: Self-pay | Admitting: Certified Nurse Midwife

## 2016-11-22 DIAGNOSIS — Z3400 Encounter for supervision of normal first pregnancy, unspecified trimester: Secondary | ICD-10-CM

## 2016-11-22 LAB — AFP, SERUM, OPEN SPINA BIFIDA
AFP MoM: 0.73
AFP Value: 30 ng/mL
GEST. AGE ON COLLECTION DATE: 18.4 wk
Maternal Age At EDD: 22.7 yr
OSBR Risk 1 IN: 10000
TEST RESULTS AFP: NEGATIVE
Weight: 204 [lb_av]

## 2016-12-14 ENCOUNTER — Ambulatory Visit (INDEPENDENT_AMBULATORY_CARE_PROVIDER_SITE_OTHER): Payer: Medicaid Other | Admitting: Certified Nurse Midwife

## 2016-12-14 ENCOUNTER — Encounter: Payer: Self-pay | Admitting: Certified Nurse Midwife

## 2016-12-14 VITALS — BP 130/85 | HR 105 | Wt 210.6 lb

## 2016-12-14 DIAGNOSIS — Z283 Underimmunization status: Secondary | ICD-10-CM

## 2016-12-14 DIAGNOSIS — Z2839 Other underimmunization status: Secondary | ICD-10-CM

## 2016-12-14 DIAGNOSIS — O99012 Anemia complicating pregnancy, second trimester: Secondary | ICD-10-CM

## 2016-12-14 DIAGNOSIS — O9989 Other specified diseases and conditions complicating pregnancy, childbirth and the puerperium: Secondary | ICD-10-CM

## 2016-12-14 DIAGNOSIS — R7989 Other specified abnormal findings of blood chemistry: Secondary | ICD-10-CM

## 2016-12-14 DIAGNOSIS — Z3400 Encounter for supervision of normal first pregnancy, unspecified trimester: Secondary | ICD-10-CM

## 2016-12-14 DIAGNOSIS — D649 Anemia, unspecified: Secondary | ICD-10-CM

## 2016-12-14 DIAGNOSIS — E559 Vitamin D deficiency, unspecified: Secondary | ICD-10-CM

## 2016-12-14 DIAGNOSIS — Z3402 Encounter for supervision of normal first pregnancy, second trimester: Secondary | ICD-10-CM

## 2016-12-14 NOTE — Progress Notes (Signed)
Patient reports no concerns today 

## 2016-12-14 NOTE — Progress Notes (Signed)
   PRENATAL VISIT NOTE  Subjective:  Heather Noble is a 21 y.o. G1P0 at 49w4dbeing seen today for ongoing prenatal care.  She is currently monitored for the following issues for this low-risk pregnancy and has Migraine with aura and without status migrainosus, not intractable; Supervision of normal first pregnancy, antepartum; Sickle cell trait ([place]; Low vitamin D level; Rubella non-immune status, antepartum; and Anemia affecting pregnancy in second trimester on her problem list.  Patient reports no complaints.  Contractions: Not present. Vag. Bleeding: None.  Movement: Present. Denies leaking of fluid.   The following portions of the patient's history were reviewed and updated as appropriate: allergies, current medications, past family history, past medical history, past social history, past surgical history and problem list. Problem list updated.  Objective:   Vitals:   12/14/16 1030  BP: 130/85  Pulse: (!) 105  Weight: 210 lb 9.6 oz (95.5 kg)    Fetal Status: Fetal Heart Rate (bpm): 137 Fundal Height: 23 cm Movement: Present     General:  Alert, oriented and cooperative. Patient is in no acute distress.  Skin: Skin is warm and dry. No rash noted.   Cardiovascular: Normal heart rate noted  Respiratory: Normal respiratory effort, no problems with respiration noted  Abdomen: Soft, gravid, appropriate for gestational age. Pain/Pressure: Absent     Pelvic:  Cervical exam deferred        Extremities: Normal range of motion.  Edema: None  Mental Status: Normal mood and affect. Normal behavior. Normal judgment and thought content.   Assessment and Plan:  Pregnancy: G1P0 at 215w4d1. Supervision of normal first pregnancy, antepartum     Doing well  2. Low vitamin D level     Taking vitamin D  3. Rubella non-immune status, antepartum     MMR postpartum  4. Anemia affecting pregnancy in second trimester     Taking Bloom daily  Preterm labor symptoms and general obstetric  precautions including but not limited to vaginal bleeding, contractions, leaking of fluid and fetal movement were reviewed in detail with the patient. Please refer to After Visit Summary for other counseling recommendations.  Return in about 4 weeks (around 01/11/2017) for ROB.   RaMorene CrockerCNM

## 2016-12-14 NOTE — Patient Instructions (Signed)
AREA PEDIATRIC/FAMILY [place] [address] [place], [place]  [address] Phone - [phone]   Fax - [phone]  [facility] [place] [address] [place], [place] [address] Phone - [phone]   Fax - [place] [address] [place] [address], [place]  [address] Phone - [phone]   Fax - [phone]  [place]. [address], [place] [address] [place], [place]  [address] Phone - [phone]   Fax - [phone]  [place] [address] [place], [place]  [address] Phone - [phone]   Fax - [phone]  [facility] [address] [place], [place]  [address] Phone - [phone]   Fax - [place] [address], [place], [place]  [address] Phone - [phone]   Fax - [phone]  [place] [address] [place], [place] [address] [place], [place]  [address] Phone - [phone]   Fax - [place] [address] [place], [place]  [address] Phone - [phone]   Fax - [phone] [facility] [place]. [address] [place], [place]  [address] Phone - [phone]   Fax - [phone]  [facility] [place] [address], [place]  [address] Phone - [phone]   Fax - [phone]  [facility] AT [place], [place], [place]  [address] Phone - [phone]   Fax - [place] [address], [place], [place]  [address] Phone - [phone]   Fax - [phone]  [facility] [address] [place], [place]  [address] Phone - [place], [place]  [address] Phone - [phone]   Fax - [place] [address], [place], [place]  [address] Phone - [phone]   Fax - [phone]  [place] [address] [place], [place]  [address] Phone - [phone]   Fax - [place]. [place], [place]  [address] Phone - [phone]   Fax - [place], [place], [place]  [address] Phone - [phone]   Fax - [place] [address] [place], [place]  [address] Phone - [phone]   Fax - [phone]  DAVID RUBIN [address], [place], [place]  [address] Phone - [phone]   Fax - [place] [address] [place], [place]  [address] Phone - [phone]   Fax - [phone]  [place] [address] [place], [place]  [address] Phone - [phone]   Fax - [phone] [place]nal[place] [address] [place],   [address] Phone - [phone]   Fax - [place] [address] [place],   [address] Phone - [phone]   Fax - [place] [address], [place],   [address] Phone - [phone]   Fax - [phone]  [place] MD [address] [place] [address] Phone [phone]  Fax [phone]   Places to have your son circumcised:    [place] [phone] by  4 wks  [facility] [phone] $244 by 4 wks  [facility] [phone] $[phone] by 2 wks  [place] [phone] $[phone] by 4 wks [facility] [phone] $[phone] by 4 wks  These prices sometimes change but are roughly what you can expect to pay. Please call and  confirm pricing.   Circumcision is considered an elective/non-medically necessary procedure. There are many reasons parents decide to have their sons circumsized. During the first year of life circumcised males have a reduced risk of urinary tract infections but after this year the rates between circumcised males and uncircumcised males are the same.  It is safe to have your son circumcised outside of the hospital and the places above perform them regularly.

## 2017-01-11 ENCOUNTER — Ambulatory Visit (INDEPENDENT_AMBULATORY_CARE_PROVIDER_SITE_OTHER): Payer: Medicaid Other | Admitting: Certified Nurse Midwife

## 2017-01-11 VITALS — BP 118/79 | HR 94 | Wt 216.2 lb

## 2017-01-11 DIAGNOSIS — R7989 Other specified abnormal findings of blood chemistry: Secondary | ICD-10-CM

## 2017-01-11 DIAGNOSIS — Z2839 Other underimmunization status: Secondary | ICD-10-CM

## 2017-01-11 DIAGNOSIS — O99012 Anemia complicating pregnancy, second trimester: Secondary | ICD-10-CM

## 2017-01-11 DIAGNOSIS — Z283 Underimmunization status: Secondary | ICD-10-CM

## 2017-01-11 DIAGNOSIS — Z3400 Encounter for supervision of normal first pregnancy, unspecified trimester: Secondary | ICD-10-CM

## 2017-01-11 DIAGNOSIS — Z3402 Encounter for supervision of normal first pregnancy, second trimester: Secondary | ICD-10-CM

## 2017-01-11 DIAGNOSIS — O9989 Other specified diseases and conditions complicating pregnancy, childbirth and the puerperium: Secondary | ICD-10-CM

## 2017-01-11 NOTE — Progress Notes (Signed)
Patient has back pain on R- constant

## 2017-01-11 NOTE — Patient Instructions (Signed)
AREA PEDIATRIC/FAMILY PRACTICE PHYSICIANS  [facility] [address] [place], [place]  [address] Phone - [phone]   Fax - [phone]  [facility] [place] [address] [place], [place] [address] Phone - [phone]   Fax - [phone]  JACK AMOS [address] [place], [place]  [address] Phone - [phone]   Fax - [phone]  [facility] [address] [place], [place]  [address] Phone - [phone]   Fax - [phone]  [facility] [address] [place], [place]  [address] Phone - [phone]   Fax - [phone]  [facility] [address], [address] [place], [place]  [address] Phone - [phone]   Fax - [phone]  [facility] [place] [address] [place], [place]  [address] Phone - [phone]   Fax - [phone]  [facility] [address] [place], [place]  [address] Phone - [phone]   Fax - [phone]  [facility] [address] [place], [place]  [address] Phone - [phone]   Fax - [phone] [facility] [place] [place] [address] [place], [place]  [address] Phone - [phone]   Fax - [phone]  [facility] [place] [address] [place], [place]  [address] Phone - [phone]   Fax - [phone]  [facility] [address] [place], [place]  [address] Phone - [phone]   Fax - [phone]  [facility] [address], [address] [place], [place]  [address] Phone - [phone]   Fax - [phone]  SHILPA GOSRANI [address] [place], [place]  [address] Phone - [phone]  [facility] [address] [place], [place]  [address] Phone - [phone]   Fax - [phone]  [facility] [address] [place], [place]  [address] Phone - [phone]   Fax - [phone]  [facility] [address] [place], [place]  [address] Phone - [phone]   Fax - [phone]  [facility] [address] [place], [place]  [address] Phone - [phone]   Fax - [phone]   [facility] [address] [place], [place]  [address] Phone - [phone]   Fax - [phone]  [facility] [address] [place], [place]  [address] Phone - [phone]   Fax - [phone]  DAVID RUBIN [address] [place], [place]  [address] Phone - [phone]   Fax - [phone]  [facility] [address] [place], [place]  [address] Phone - [phone]   Fax - [phone]  [place] [facility] [place] [address] , [place]  [address] Phone - [phone]   Fax - [phone] [place] [facility] [place] [address] [place], [place]  [address] Phone - [phone]   Fax - [phone]  [place] [facility] [place] [address] [place], [place]  [address] Phone - [phone]   Fax - [phone]  [facility] [place] [address] [place],   [address] Phone - [phone]   Fax - [phone]  [facility] [place] Charlene Flemming MD [address] [place]  [address] Phone [phone]  Fax [phone]   

## 2017-01-15 ENCOUNTER — Encounter: Payer: Self-pay | Admitting: Certified Nurse Midwife

## 2017-01-15 NOTE — Progress Notes (Signed)
   PRENATAL VISIT NOTE  Subjective:  Heather Noble is a 21 y.o. G1P0 at 83w1dbeing seen today for ongoing prenatal care.  She is currently monitored for the following issues for this low-risk pregnancy and has Migraine with aura and without status migrainosus, not intractable; Supervision of normal first pregnancy, antepartum; Sickle cell trait (HEmelle; Low vitamin D level; Rubella non-immune status, antepartum; and Anemia affecting pregnancy in second trimester on her problem list.  Patient reports no complaints.  Contractions: Not present. Vag. Bleeding: None.  Movement: Present. Denies leaking of fluid.   The following portions of the patient's history were reviewed and updated as appropriate: allergies, current medications, past family history, past medical history, past social history, past surgical history and problem list. Problem list updated.  Objective:   Vitals:   01/11/17 1033  BP: 118/79  Pulse: 94  Weight: 216 lb 3.2 oz (98.1 kg)    Fetal Status: Fetal Heart Rate (bpm): 132 Fundal Height: 26 cm Movement: Present     General:  Alert, oriented and cooperative. Patient is in no acute distress.  Skin: Skin is warm and dry. No rash noted.   Cardiovascular: Normal heart rate noted  Respiratory: Normal respiratory effort, no problems with respiration noted  Abdomen: Soft, gravid, appropriate for gestational age.  Pain/Pressure: Absent     Pelvic: Cervical exam deferred        Extremities: Normal range of motion.  Edema: None  Mental Status:  Normal mood and affect. Normal behavior. Normal judgment and thought content.   Assessment and Plan:  Pregnancy: G1P0 at 256w1d1. Supervision of normal first pregnancy, antepartum       Doing well.   2. Low vitamin D level     Taking weekly vitamin D  3. Rubella non-immune status, antepartum     MMR postpartum  4. Anemia affecting pregnancy in second trimester     Taking Bloom.   Preterm labor symptoms and general obstetric  precautions including but not limited to vaginal bleeding, contractions, leaking of fluid and fetal movement were reviewed in detail with the patient. Please refer to After Visit Summary for other counseling recommendations.  Return in about 2 weeks (around 01/25/2017) for ROB, 2 hr OGTT.   RaMorene CrockerCNM

## 2017-01-25 ENCOUNTER — Encounter: Payer: Medicaid Other | Admitting: Certified Nurse Midwife

## 2017-01-26 ENCOUNTER — Other Ambulatory Visit: Payer: Medicaid Other

## 2017-01-26 ENCOUNTER — Ambulatory Visit (INDEPENDENT_AMBULATORY_CARE_PROVIDER_SITE_OTHER): Payer: Medicaid Other | Admitting: Certified Nurse Midwife

## 2017-01-26 VITALS — BP 126/83 | HR 93 | Wt 215.6 lb

## 2017-01-26 DIAGNOSIS — O99012 Anemia complicating pregnancy, second trimester: Secondary | ICD-10-CM

## 2017-01-26 DIAGNOSIS — O26893 Other specified pregnancy related conditions, third trimester: Secondary | ICD-10-CM

## 2017-01-26 DIAGNOSIS — Z2839 Other underimmunization status: Secondary | ICD-10-CM

## 2017-01-26 DIAGNOSIS — R7989 Other specified abnormal findings of blood chemistry: Secondary | ICD-10-CM

## 2017-01-26 DIAGNOSIS — E559 Vitamin D deficiency, unspecified: Secondary | ICD-10-CM

## 2017-01-26 DIAGNOSIS — O9989 Other specified diseases and conditions complicating pregnancy, childbirth and the puerperium: Secondary | ICD-10-CM

## 2017-01-26 DIAGNOSIS — Z283 Underimmunization status: Secondary | ICD-10-CM

## 2017-01-26 DIAGNOSIS — Z3400 Encounter for supervision of normal first pregnancy, unspecified trimester: Secondary | ICD-10-CM

## 2017-01-26 DIAGNOSIS — M549 Dorsalgia, unspecified: Secondary | ICD-10-CM

## 2017-01-26 MED ORDER — COMFORT FIT MATERNITY SUPP LG MISC: 1.0000 [IU] | Freq: Every day | 0 refills | Status: DC

## 2017-01-26 NOTE — Progress Notes (Signed)
Patient reports good fetal movement, denies contractions and complains of occasional back and hip pain.

## 2017-01-27 LAB — IRON,TIBC AND FERRITIN PANEL
Ferritin: 14 ng/mL — ABNORMAL LOW (ref 15–150)
IRON SATURATION: 10 % — AB (ref 15–55)
IRON: 49 ug/dL (ref 27–159)
Total Iron Binding Capacity: 469 ug/dL — ABNORMAL HIGH (ref 250–450)
UIBC: 420 ug/dL (ref 131–425)

## 2017-01-27 LAB — GLUCOSE TOLERANCE, 2 HOURS W/ 1HR
GLUCOSE, 1 HOUR: 112 mg/dL (ref 65–179)
GLUCOSE, 2 HOUR: 92 mg/dL (ref 65–152)
Glucose, Fasting: 84 mg/dL (ref 65–91)

## 2017-01-27 LAB — CBC
HEMOGLOBIN: 8.8 g/dL — AB (ref 11.1–15.9)
Hematocrit: 26.5 % — ABNORMAL LOW (ref 34.0–46.6)
MCH: 30.4 pg (ref 26.6–33.0)
MCHC: 33.2 g/dL (ref 31.5–35.7)
MCV: 92 fL (ref 79–97)
PLATELETS: 338 10*3/uL (ref 150–379)
RBC: 2.89 x10E6/uL — AB (ref 3.77–5.28)
RDW: 12.6 % (ref 12.3–15.4)
WBC: 7.1 10*3/uL (ref 3.4–10.8)

## 2017-01-27 LAB — VITAMIN D 25 HYDROXY (VIT D DEFICIENCY, FRACTURES): Vit D, 25-Hydroxy: 19.7 ng/mL — ABNORMAL LOW (ref 30.0–100.0)

## 2017-01-27 LAB — HIV ANTIBODY (ROUTINE TESTING W REFLEX): HIV SCREEN 4TH GENERATION: NONREACTIVE

## 2017-01-27 LAB — RPR: RPR Ser Ql: NONREACTIVE

## 2017-01-30 ENCOUNTER — Encounter: Payer: Self-pay | Admitting: Certified Nurse Midwife

## 2017-01-30 NOTE — Progress Notes (Signed)
   PRENATAL VISIT NOTE  Subjective:  Heather Noble is a 21 y.o. G1P0 at 47w2dbeing seen today for ongoing prenatal care.  She is currently monitored for the following issues for this low-risk pregnancy and has Migraine with aura and without status migrainosus, not intractable; Supervision of normal first pregnancy, antepartum; Sickle cell trait (HW[place]; Low vitamin D level; Rubella non-immune status, antepartum; and Anemia affecting pregnancy in second trimester on her problem list.  Patient reports no complaints.  Contractions: Not present. Vag. Bleeding: None.  Movement: Present. Denies leaking of fluid.   The following portions of the patient's history were reviewed and updated as appropriate: allergies, current medications, past family history, past medical history, past social history, past surgical history and problem list. Problem list updated.  Objective:   Vitals:   01/26/17 0909  BP: 126/83  Pulse: 93  Weight: 215 lb 9.6 oz (97.8 kg)    Fetal Status: Fetal Heart Rate (bpm): 130 Fundal Height: 28 cm Movement: Present     General:  Alert, oriented and cooperative. Patient is in no acute distress.  Skin: Skin is warm and dry. No rash noted.   Cardiovascular: Normal heart rate noted  Respiratory: Normal respiratory effort, no problems with respiration noted  Abdomen: Soft, gravid, appropriate for gestational age.  Pain/Pressure: Present     Pelvic: Cervical exam deferred        Extremities: Normal range of motion.  Edema: None  Mental Status:  Normal mood and affect. Normal behavior. Normal judgment and thought content.   Assessment and Plan:  Pregnancy: G1P0 at 267w2d1. Supervision of normal first pregnancy, antepartum       - Glucose Tolerance, 2 Hours w/1 Hour - RPR - CBC - HIV antibody  2. Low vitamin D level     Taking vitamin D.  - Vitamin D (25 hydroxy)  3. Rubella non-immune status, antepartum     MMR postpartum  4. Anemia affecting pregnancy in second  trimester      - Iron, TIBC and Ferritin Panel  5. Back pain affecting pregnancy in third trimester      - Elastic Bandages & Supports (COMFORT FIT MATERNITY SUPP LG) MISC; 1 Units by Does not apply route daily.  Dispense: 1 each; Refill: 0  Preterm labor symptoms and general obstetric precautions including but not limited to vaginal bleeding, contractions, leaking of fluid and fetal movement were reviewed in detail with the patient. Please refer to After Visit Summary for other counseling recommendations.  Return in about 2 weeks (around 02/09/2017).   RaMorene CrockerCNM

## 2017-02-01 ENCOUNTER — Other Ambulatory Visit: Payer: Self-pay | Admitting: Certified Nurse Midwife

## 2017-02-01 DIAGNOSIS — R7989 Other specified abnormal findings of blood chemistry: Secondary | ICD-10-CM

## 2017-02-01 DIAGNOSIS — O99012 Anemia complicating pregnancy, second trimester: Secondary | ICD-10-CM

## 2017-02-01 DIAGNOSIS — Z3400 Encounter for supervision of normal first pregnancy, unspecified trimester: Secondary | ICD-10-CM

## 2017-02-01 MED ORDER — VITAMIN D (ERGOCALCIFEROL) 1.25 MG (50000 UNIT) PO CAPS: 50000.0000 [IU] | ORAL_CAPSULE | ORAL | 2 refills | Status: DC

## 2017-02-01 MED ORDER — CITRANATAL BLOOM 90-1 MG PO TABS: 1.0000 | ORAL_TABLET | Freq: Every day | ORAL | 12 refills | Status: DC

## 2017-02-09 ENCOUNTER — Ambulatory Visit (INDEPENDENT_AMBULATORY_CARE_PROVIDER_SITE_OTHER): Payer: Medicaid Other | Admitting: Certified Nurse Midwife

## 2017-02-09 VITALS — BP 116/77 | HR 92 | Wt 215.0 lb

## 2017-02-09 DIAGNOSIS — O99013 Anemia complicating pregnancy, third trimester: Secondary | ICD-10-CM

## 2017-02-09 DIAGNOSIS — D649 Anemia, unspecified: Secondary | ICD-10-CM

## 2017-02-09 DIAGNOSIS — Z3400 Encounter for supervision of normal first pregnancy, unspecified trimester: Secondary | ICD-10-CM

## 2017-02-09 DIAGNOSIS — R7989 Other specified abnormal findings of blood chemistry: Secondary | ICD-10-CM

## 2017-02-09 NOTE — Progress Notes (Signed)
Patient reports good fetal movement, denies pain. 

## 2017-02-09 NOTE — Progress Notes (Signed)
   PRENATAL VISIT NOTE  Subjective:  Heather Noble is a 21 y.o. G1P0 at 1414w5d being seen today for ongoing prenatal care.  She is currently monitored for the following issues for this low-risk pregnancy and has Migraine with aura and without status migrainosus, not intractable; Supervision of normal first pregnancy, antepartum; Sickle cell trait (HCC); Low vitamin D level; Rubella non-immune status, antepartum; and Anemia affecting pregnancy in third trimester on her problem list.  Patient reports no complaints.  Contractions: Not present. Vag. Bleeding: None.  Movement: Present. Denies leaking of fluid.   The following portions of the patient's history were reviewed and updated as appropriate: allergies, current medications, past family history, past medical history, past social history, past surgical history and problem list. Problem list updated.  Objective:   Vitals:   02/09/17 0911  BP: 116/77  Pulse: 92  Weight: 215 lb (97.5 kg)    Fetal Status: Fetal Heart Rate (bpm): 132; doppler Fundal Height: 32 cm Movement: Present     General:  Alert, oriented and cooperative. Patient is in no acute distress.  Skin: Skin is warm and dry. No rash noted.   Cardiovascular: Normal heart rate noted  Respiratory: Normal respiratory effort, no problems with respiration noted  Abdomen: Soft, gravid, appropriate for gestational age.  Pain/Pressure: Absent     Pelvic: Cervical exam deferred        Extremities: Normal range of motion.  Edema: Trace  Mental Status:  Normal mood and affect. Normal behavior. Normal judgment and thought content.   Assessment and Plan:  Pregnancy: G1P0 at 1214w5d  1. Supervision of normal first pregnancy, antepartum     Doing well.  Waterbirth discussed.  Is enrolled for class later this month.   2. Anemia affecting pregnancy in third trimester     Taking bloom.   3. Low vitamin D level     Taking weekly vitamin D.   Preterm labor symptoms and general  obstetric precautions including but not limited to vaginal bleeding, contractions, leaking of fluid and fetal movement were reviewed in detail with the patient. Please refer to After Visit Summary for other counseling recommendations.  Return in about 2 weeks (around 02/23/2017) for ROB.   Roe Coombsachelle A Emersyn Wyss, CNM

## 2017-02-09 NOTE — Patient Instructions (Signed)
AREA PEDIATRIC/FAMILY PRACTICE PHYSICIANS  [facility] [address] [place], [place]  [address] Phone - [phone]   Fax - [phone]  [facility] [place] [address] [place], [place] [address] Phone - [phone]   Fax - [phone]  JACK AMOS [address] [place], [place]  [address] Phone - [phone]   Fax - [phone]  [facility] [address] [place], [place]  [address] Phone - [phone]   Fax - [phone]  [facility] [address] [place], [place]  [address] Phone - [phone]   Fax - [phone]  [facility] [address] [place], [place]  [address] Phone - [phone]   Fax - [phone]  [facility] [place] [address] [place], [place]  [address] Phone - [phone]   Fax - [phone]  [facility] [address] [place], [place]  [address] Phone - [phone]   Fax - [phone]  [facility] [address] [place], [place]  [address] Phone - [phone]   Fax - [phone] [facility] [place] [address] [place], [place]  [address] Phone - [phone]   Fax - [phone]  [facility] [address] [place], [place]  [address] Phone - [phone]   Fax - [phone]  [facility] [address] [place], [place]  [address] Phone - [phone]   Fax - [phone]  [facility] [address] [place], [place]  [address] Phone - [phone]   Fax - [phone]  SHILPA GOSRANI [address] [place], [place]  [address] Phone - [phone]  [facility] [address] [place], [place]  [address] Phone - [phone]   Fax - [phone]  [facility] [place] [facility] [address] [place], [place]  [address] Phone - [phone]   Fax - [phone]  [facility] [address] [place], [place]  [address] Phone - [phone]   Fax - [phone]  [facility] [address] [place], [place]  [address] Phone - [phone]   Fax - [phone]   [facility] [address] [place], [place]  [address] Phone - [phone]   Fax - [phone]  [facility] [address] [place], [place]  [address] Phone - [phone]   Fax - [phone]  DAVID RUBIN [address] [place], [place]  [address] Phone - [phone]   Fax - [phone]  [facility] [address] [place], [place]  [address] Phone - [phone]   Fax - [phone]  [place] [facility] [address] , [place]  [address] Phone - [phone]   Fax - [phone] [place] [facility] [place] [address] [place], [place]  [address] Phone - [phone]   Fax - [phone]  [place] [facility] [place] [address] [place], [place]  [address] Phone - [phone]   Fax - [phone]  [facility] [place] [address] [place], [place]  [address] Phone - [phone]   Fax - [phone]  [facility] [place] Charlene Flemming MD [address] [place] [address] Phone [phone]  Fax [phone]   

## 2017-02-13 ENCOUNTER — Encounter: Payer: Self-pay | Admitting: *Deleted

## 2017-02-23 ENCOUNTER — Ambulatory Visit (INDEPENDENT_AMBULATORY_CARE_PROVIDER_SITE_OTHER): Payer: Medicaid Other | Admitting: Certified Nurse Midwife

## 2017-02-23 VITALS — BP 133/84 | HR 90 | Wt 217.0 lb

## 2017-02-23 DIAGNOSIS — E559 Vitamin D deficiency, unspecified: Secondary | ICD-10-CM

## 2017-02-23 DIAGNOSIS — D649 Anemia, unspecified: Secondary | ICD-10-CM

## 2017-02-23 DIAGNOSIS — R7989 Other specified abnormal findings of blood chemistry: Secondary | ICD-10-CM

## 2017-02-23 DIAGNOSIS — Z283 Underimmunization status: Secondary | ICD-10-CM

## 2017-02-23 DIAGNOSIS — Z3400 Encounter for supervision of normal first pregnancy, unspecified trimester: Secondary | ICD-10-CM

## 2017-02-23 DIAGNOSIS — Z3403 Encounter for supervision of normal first pregnancy, third trimester: Secondary | ICD-10-CM

## 2017-02-23 DIAGNOSIS — O09899 Supervision of other high risk pregnancies, unspecified trimester: Secondary | ICD-10-CM

## 2017-02-23 DIAGNOSIS — O99013 Anemia complicating pregnancy, third trimester: Secondary | ICD-10-CM

## 2017-02-23 DIAGNOSIS — O26843 Uterine size-date discrepancy, third trimester: Secondary | ICD-10-CM

## 2017-02-23 DIAGNOSIS — O9989 Other specified diseases and conditions complicating pregnancy, childbirth and the puerperium: Secondary | ICD-10-CM

## 2017-02-23 NOTE — Progress Notes (Signed)
   PRENATAL VISIT NOTE  Subjective:  Heather Noble is a 21 y.o. G1P0 at 66w5dbeing seen today for ongoing prenatal care.  She is currently monitored for the following issues for this low-risk pregnancy and has Migraine with aura and without status migrainosus, not intractable; Supervision of normal first pregnancy, antepartum; Sickle cell trait (HStafford; Low vitamin D level; Rubella non-immune status, antepartum; and Anemia affecting pregnancy in third trimester on her problem list.  Patient reports no complaints.  Contractions: Not present. Vag. Bleeding: None.  Movement: Present. Denies leaking of fluid.   The following portions of the patient's history were reviewed and updated as appropriate: allergies, current medications, past family history, past medical history, past social history, past surgical history and problem list. Problem list updated.  Objective:   Vitals:   02/23/17 0908  BP: 133/84  Pulse: 90  Weight: 217 lb (98.4 kg)    Fetal Status: Fetal Heart Rate (bpm): 130; doppler Fundal Height: 35 cm Movement: Present     General:  Alert, oriented and cooperative. Patient is in no acute distress.  Skin: Skin is warm and dry. No rash noted.   Cardiovascular: Normal heart rate noted  Respiratory: Normal respiratory effort, no problems with respiration noted  Abdomen: Soft, gravid, appropriate for gestational age.  Pain/Pressure: Absent     Pelvic: Cervical exam deferred        Extremities: Normal range of motion.  Edema: Trace  Mental Status:  Normal mood and affect. Normal behavior. Normal judgment and thought content.   Assessment and Plan:  Pregnancy: G1P0 at 333w5d1. Supervision of normal first pregnancy, antepartum     Attended waterbirth class, needs to bring certificate  2. Rubella non-immune status, antepartum     MMR postpartum  3. Low vitamin D level     Taking weekly vitamin D  4. Anemia affecting pregnancy in third trimester     Taking Bloom  5.  Uterine size date discrepancy pregnancy, third trimester      S>D - [place]FM OB FOLLOW UP; Future  Preterm labor symptoms and general obstetric precautions including but not limited to vaginal bleeding, contractions, leaking of fluid and fetal movement were reviewed in detail with the patient. Please refer to After Visit Summary for other counseling recommendations.  Return in about 2 weeks (around 03/09/2017) for ROB.   RaMorene CrockerCNM

## 2017-03-06 ENCOUNTER — Ambulatory Visit (HOSPITAL_COMMUNITY)
Admission: RE | Admit: 2017-03-06 | Discharge: 2017-03-06 | Disposition: A | Discharge status: Home / Self Care | Payer: Medicaid Other | Source: Ambulatory Visit | Attending: Certified Nurse Midwife | Admitting: Certified Nurse Midwife

## 2017-03-06 DIAGNOSIS — O26843 Uterine size-date discrepancy, third trimester: Secondary | ICD-10-CM | POA: Diagnosis present

## 2017-03-06 DIAGNOSIS — Z3A34 34 weeks gestation of pregnancy: Secondary | ICD-10-CM | POA: Diagnosis not present

## 2017-03-07 ENCOUNTER — Other Ambulatory Visit: Payer: Self-pay | Admitting: Certified Nurse Midwife

## 2017-03-09 ENCOUNTER — Encounter: Payer: Self-pay | Admitting: *Deleted

## 2017-03-09 ENCOUNTER — Ambulatory Visit (INDEPENDENT_AMBULATORY_CARE_PROVIDER_SITE_OTHER): Payer: Medicaid Other | Admitting: Certified Nurse Midwife

## 2017-03-09 VITALS — BP 127/83 | HR 89 | Wt 216.0 lb

## 2017-03-09 DIAGNOSIS — O09899 Supervision of other high risk pregnancies, unspecified trimester: Secondary | ICD-10-CM

## 2017-03-09 DIAGNOSIS — O9989 Other specified diseases and conditions complicating pregnancy, childbirth and the puerperium: Secondary | ICD-10-CM

## 2017-03-09 DIAGNOSIS — Z3400 Encounter for supervision of normal first pregnancy, unspecified trimester: Secondary | ICD-10-CM

## 2017-03-09 DIAGNOSIS — Z3403 Encounter for supervision of normal first pregnancy, third trimester: Secondary | ICD-10-CM

## 2017-03-09 DIAGNOSIS — Z283 Underimmunization status: Secondary | ICD-10-CM

## 2017-03-09 DIAGNOSIS — R7989 Other specified abnormal findings of blood chemistry: Secondary | ICD-10-CM

## 2017-03-09 NOTE — Progress Notes (Signed)
   PRENATAL VISIT NOTE  Subjective:  Heather Noble is a 21 y.o. G1P0 at 51w5dbeing seen today for ongoing prenatal care.  She is currently monitored for the following issues for this low-risk pregnancy and has Migraine with aura and without status migrainosus, not intractable; Supervision of normal first pregnancy, antepartum; Sickle cell trait (HCambridge; Low vitamin D level; Rubella non-immune status, antepartum; and Anemia affecting pregnancy in third trimester on her problem list.  Patient reports no complaints.  Contractions: Irritability. Vag. Bleeding: None.  Movement: Present. Denies leaking of fluid.   The following portions of the patient's history were reviewed and updated as appropriate: allergies, current medications, past family history, past medical history, past social history, past surgical history and problem list. Problem list updated.  Objective:   Vitals:   03/09/17 1046  BP: 127/83  Pulse: 89  Weight: 216 lb (98 kg)    Fetal Status: Fetal Heart Rate (bpm): 132; doppler Fundal Height: 35 cm Movement: Present     General:  Alert, oriented and cooperative. Patient is in no acute distress.  Skin: Skin is warm and dry. No rash noted.   Cardiovascular: Normal heart rate noted  Respiratory: Normal respiratory effort, no problems with respiration noted  Abdomen: Soft, gravid, appropriate for gestational age.  Pain/Pressure: Absent     Pelvic: Cervical exam deferred        Extremities: Normal range of motion.  Edema: None  Mental Status:  Normal mood and affect. Normal behavior. Normal judgment and thought content.   Assessment and Plan:  Pregnancy: G1P0 at 323w5d1. Supervision of normal first pregnancy, antepartum     Doing well. Normal USKoreahis week: EFW: 77%  2. Low vitamin D level     Taking weekly vitamin D  3. Rubella non-immune status, antepartum     MMR postpartum  Preterm labor symptoms and general obstetric precautions including but not limited to  vaginal bleeding, contractions, leaking of fluid and fetal movement were reviewed in detail with the patient. Please refer to After Visit Summary for other counseling recommendations.  Return in about 1 week (around 03/16/2017) for ROB, GBS.   RaMorene CrockerCNM

## 2017-03-09 NOTE — Patient Instructions (Signed)
AREA PEDIATRIC/FAMILY PRACTICE PHYSICIANS   [facility] [address], [address] [place], [place]  [address] Phone - [phone]   Fax - [phone]  [facility] [address] [place], [place] [address] Phone - [phone]   Fax - [phone]  JACK AMOS [address] [place], [place]  [address] Phone - [phone]   Fax - [phone]  [facility] [address] [place], [place]  [address] Phone - [phone]   Fax - [phone]  [facility] [address] [place], [place]  [address] Phone - [phone]   Fax - [phone]  [facility] [address], [address] [place], [place]  [address] Phone - [phone]   Fax - [phone]  [facility] [address] [place], [place]  [address] Phone - [phone]   Fax - [phone]  [facility] [address], [address] [place], [place]  [address] Phone - [phone]   Fax - [phone]  [facility] [address] [place], [place]  [address] Phone - [phone]   Fax - [phone] [facility] [address] [place], [place]  [address] Phone - [phone]   Fax - [phone]  [facility] [address] [place], [place]  [address] Phone - [phone]   Fax - [phone]  [facility] [address] [place], [place]  [address] Phone - [phone]   Fax - [phone]  [facility] [address] [place], [place]  [address] Phone - [phone]   Fax - [phone]  SHILPA GOSRANI [address] [place], [place]  [address] Phone - [phone]  [facility] [address] [place], [place]  [address] Phone - [phone]   Fax - [phone]  [facility] [address] [place], [place]  [address] Phone - [phone]   Fax - [phone]  [facility] [address] [place], [place]  [address] Phone - [phone]   Fax - [phone]  [facility] [address] [place], [place]  [address] Phone - [phone]   Fax - [phone]   [facility] [address] [place], [place]  [address] Phone - [phone]   Fax - [phone]  [facility] [address] [place], [place]  [address] Phone - [phone]   Fax - [phone]  DAVID RUBIN [address] [place], [place]  [address] Phone - [phone]   Fax - [phone]  [facility] [address] [place], [place]  [address] Phone - [phone]   Fax - [phone]  [facility] [address] , [place]  [address] Phone - [phone]   Fax - [phone] [facility] [address] [place], [place]  [address] Phone - [phone]   Fax - [phone]  [facility] [address] [place], [place]  [address] Phone - [phone]   Fax - [phone]  [facility] [address] [place],   [address] Phone - [phone]   Fax - [phone]  [facility] Charlene Flemming MD [address] [place]  [address] Phone [phone]  Fax [phone]   

## 2017-03-16 ENCOUNTER — Encounter: Payer: Self-pay | Admitting: *Deleted

## 2017-03-16 ENCOUNTER — Other Ambulatory Visit (HOSPITAL_COMMUNITY)
Admission: RE | Admit: 2017-03-16 | Discharge: 2017-03-16 | Disposition: A | Discharge status: Home / Self Care | Payer: Medicaid Other | Source: Ambulatory Visit | Attending: Certified Nurse Midwife | Admitting: Certified Nurse Midwife

## 2017-03-16 ENCOUNTER — Ambulatory Visit (INDEPENDENT_AMBULATORY_CARE_PROVIDER_SITE_OTHER): Payer: Medicaid Other | Admitting: Certified Nurse Midwife

## 2017-03-16 ENCOUNTER — Encounter: Payer: Self-pay | Admitting: Certified Nurse Midwife

## 2017-03-16 VITALS — BP 134/85 | HR 81 | Wt 217.0 lb

## 2017-03-16 DIAGNOSIS — R7989 Other specified abnormal findings of blood chemistry: Secondary | ICD-10-CM

## 2017-03-16 DIAGNOSIS — Z3400 Encounter for supervision of normal first pregnancy, unspecified trimester: Secondary | ICD-10-CM | POA: Diagnosis present

## 2017-03-16 DIAGNOSIS — Z283 Underimmunization status: Secondary | ICD-10-CM

## 2017-03-16 DIAGNOSIS — O9989 Other specified diseases and conditions complicating pregnancy, childbirth and the puerperium: Secondary | ICD-10-CM

## 2017-03-16 DIAGNOSIS — O09899 Supervision of other high risk pregnancies, unspecified trimester: Secondary | ICD-10-CM

## 2017-03-16 DIAGNOSIS — Z3403 Encounter for supervision of normal first pregnancy, third trimester: Secondary | ICD-10-CM

## 2017-03-16 NOTE — Progress Notes (Signed)
   PRENATAL VISIT NOTE  Subjective:  Heather Noble is a 21 y.o. G1P0 at 55w5dbeing seen today for ongoing prenatal care.  She is currently monitored for the following issues for this low-risk pregnancy and has Migraine with aura and without status migrainosus, not intractable; Supervision of normal first pregnancy, antepartum; Sickle cell trait ([place]; Low vitamin D level; Rubella non-immune status, antepartum; and Anemia affecting pregnancy in third trimester on her problem list.  Patient reports no complaints.  Contractions: Not present. Vag. Bleeding: None.  Movement: Present. Denies leaking of fluid.   The following portions of the patient's history were reviewed and updated as appropriate: allergies, current medications, past family history, past medical history, past social history, past surgical history and problem list. Problem list updated.  Objective:   Vitals:   03/16/17 1027  BP: 134/85  Pulse: 81  Weight: 217 lb (98.4 kg)    Fetal Status: Fetal Heart Rate (bpm): 130; doppler Fundal Height: 36 cm Movement: Present  Presentation: Vertex  General:  Alert, oriented and cooperative. Patient is in no acute distress.  Skin: Skin is warm and dry. No rash noted.   Cardiovascular: Normal heart rate noted  Respiratory: Normal respiratory effort, no problems with respiration noted  Abdomen: Soft, gravid, appropriate for gestational age.  Pain/Pressure: Present     Pelvic: Cervical exam performed Dilation: Closed Effacement (%): 0 Station: Ballotable, outer os 1 cm.   Extremities: Normal range of motion.     Mental Status:  Normal mood and affect. Normal behavior. Normal judgment and thought content.   Assessment and Plan:  Pregnancy: G1P0 at 361w5d1. Supervision of normal first pregnancy, antepartum  Waterbirth discussed. Consent signed.  - Strep Gp B NAA - Cervicovaginal ancillary only  2. Rubella non-immune status, antepartum     MMR postpartum  3. Low vitamin D  level     Taking weekly vitamin D  Preterm labor symptoms and general obstetric precautions including but not limited to vaginal bleeding, contractions, leaking of fluid and fetal movement were reviewed in detail with the patient. Please refer to After Visit Summary for other counseling recommendations.  Return in about 1 week (around 03/23/2017) for ROB.   RaMorene CrockerCNM

## 2017-03-16 NOTE — Patient Instructions (Addendum)
AREA PEDIATRIC/FAMILY PRACTICE PHYSICIANS  [facility] [address] [place], [place]  [address] Phone - [phone]   Fax - [phone]  [facility] [address] [place], [place] [address] Phone - [phone]   Fax - [phone]  JACK AMOS [address] [place], [place]  [address] Phone - [phone]   Fax - [phone]  [facility] [address] [place], [place]  [address] Phone - [phone]   Fax - [phone]  [facility] [address] [place], [place]  [address] Phone - [phone]   Fax - [phone]  [facility] [address] [place], [place]  [address] Phone - [phone]   Fax - [phone]  [facility] [address] [place], [place]  [address] Phone - [phone]   Fax - [phone]  [facility] [address] [place], [place]  [address] Phone - [phone]   Fax - [phone]  [facility] [address] [place], [place]  [address] Phone - [phone]   Fax - [phone] [facility] [address] [place], [place]  [address] Phone - [phone]   Fax - [phone]  [facility] [address] [place], [place]  [address] Phone - [phone]   Fax - [phone]  [facility] [address] [place], [place]  [address] Phone - [phone]   Fax - [phone]  [facility] [address] [place], [place]  [address] Phone - [phone]   Fax - [phone]  SHILPA GOSRANI [address] [place], [place]  [address] Phone - [phone]  [facility] [address] [place], [place]  [address] Phone - [phone]   Fax - [phone]  [facility] [address] [place], [place]  [address] Phone - [phone]   Fax - [phone]  [facility] [address] [place], [place]  [address] Phone - [phone]   Fax - [phone]  [facility] [address] [place], [place]  [address] Phone - [phone]   Fax - [phone]   [facility] [address] [place], [place]  [address] Phone - [phone]   Fax - [phone]  [facility] [address] [place], [place]  [address] Phone - [phone]   Fax - [phone]  DAV[facility] RUBIN [address] [place], [place]  [address] Phone - [phone]   Fax - [phone]  [facility] [address] [place], [place]  [address] Phone - [phone]   Fax - [phone]  [facility] [address] , [place]  [address] Phone - [phone]   Fax - [phone] [facility] [address] [place], [place]  [address] Phone - [phone]   Fax - [phone]  [facility] [address] [place], [place]  [address] Phone - [phone]   Fax - [phone]  [facility] [address] [place],   [address] Phone - [phone]   Fax - [phone]  [facility] [place] Charlene Flemming MD [address]   [address] Phone [phone]  Fax [phone]   

## 2017-03-18 LAB — STREP GP B NAA: STREP GROUP B AG: NEGATIVE

## 2017-03-19 ENCOUNTER — Other Ambulatory Visit: Payer: Self-pay | Admitting: Certified Nurse Midwife

## 2017-03-19 DIAGNOSIS — Z3400 Encounter for supervision of normal first pregnancy, unspecified trimester: Secondary | ICD-10-CM

## 2017-03-20 LAB — CERVICOVAGINAL ANCILLARY ONLY
BACTERIAL VAGINITIS: POSITIVE — AB
CHLAMYDIA, DNA PROBE: NEGATIVE
Candida vaginitis: NEGATIVE
NEISSERIA GONORRHEA: NEGATIVE
Trichomonas: NEGATIVE

## 2017-03-21 ENCOUNTER — Other Ambulatory Visit: Payer: Self-pay | Admitting: Certified Nurse Midwife

## 2017-03-21 DIAGNOSIS — B9689 Other specified bacterial agents as the cause of diseases classified elsewhere: Secondary | ICD-10-CM

## 2017-03-21 DIAGNOSIS — N760 Acute vaginitis: Principal | ICD-10-CM

## 2017-03-21 MED ORDER — SECNIDAZOLE 2 G PO PACK: 1.0000 | PACK | Freq: Once | ORAL | 0 refills | Status: AC

## 2017-03-22 ENCOUNTER — Other Ambulatory Visit: Payer: Self-pay

## 2017-03-22 MED ORDER — SECNIDAZOLE 2 G PO PACK: 2.0000 g | PACK | Freq: Once | ORAL | 0 refills | Status: DC

## 2017-03-22 NOTE — Progress Notes (Signed)
Left VM message to call office.

## 2017-03-23 ENCOUNTER — Ambulatory Visit (INDEPENDENT_AMBULATORY_CARE_PROVIDER_SITE_OTHER): Payer: Medicaid Other | Admitting: Certified Nurse Midwife

## 2017-03-23 ENCOUNTER — Other Ambulatory Visit: Payer: Self-pay

## 2017-03-23 VITALS — BP 132/84 | HR 89 | Wt 219.0 lb

## 2017-03-23 DIAGNOSIS — Z3400 Encounter for supervision of normal first pregnancy, unspecified trimester: Secondary | ICD-10-CM

## 2017-03-23 DIAGNOSIS — R7989 Other specified abnormal findings of blood chemistry: Secondary | ICD-10-CM

## 2017-03-23 DIAGNOSIS — O09899 Supervision of other high risk pregnancies, unspecified trimester: Secondary | ICD-10-CM

## 2017-03-23 DIAGNOSIS — N760 Acute vaginitis: Principal | ICD-10-CM

## 2017-03-23 DIAGNOSIS — Z283 Underimmunization status: Secondary | ICD-10-CM

## 2017-03-23 DIAGNOSIS — B9689 Other specified bacterial agents as the cause of diseases classified elsewhere: Secondary | ICD-10-CM

## 2017-03-23 DIAGNOSIS — O9989 Other specified diseases and conditions complicating pregnancy, childbirth and the puerperium: Secondary | ICD-10-CM

## 2017-03-23 DIAGNOSIS — Z3403 Encounter for supervision of normal first pregnancy, third trimester: Secondary | ICD-10-CM

## 2017-03-23 DIAGNOSIS — O99013 Anemia complicating pregnancy, third trimester: Secondary | ICD-10-CM

## 2017-03-23 MED ORDER — SECNIDAZOLE 2 G PO PACK: 2.0000 g | PACK | Freq: Once | ORAL | 0 refills | Status: AC

## 2017-03-23 NOTE — Progress Notes (Signed)
Patient reports good fetal movement, denies pain. Pt states that she has not picked up rx for bv yet.

## 2017-03-23 NOTE — Progress Notes (Signed)
   PRENATAL VISIT NOTE  Subjective:  Heather Noble is a 21 y.o. G1P0 at 72w5dbeing seen today for ongoing prenatal care.  She is currently monitored for the following issues for this low-risk pregnancy and has Migraine with aura and without status migrainosus, not intractable; Supervision of normal first pregnancy, antepartum; Sickle cell trait ([place]; Low vitamin D level; Rubella non-immune status, antepartum; and Anemia affecting pregnancy in third trimester on her problem list.  Patient reports no complaints.  Contractions: Not present. Vag. Bleeding: None.  Movement: Present. Denies leaking of fluid.   The following portions of the patient's history were reviewed and updated as appropriate: allergies, current medications, past family history, past medical history, past social history, past surgical history and problem list. Problem list updated.  Objective:   Vitals:   03/23/17 1111  BP: 132/84  Pulse: 89  Weight: 219 lb (99.3 kg)    Fetal Status: Fetal Heart Rate (bpm): 132; doppler Fundal Height: 38 cm Movement: Present     General:  Alert, oriented and cooperative. Patient is in no acute distress.  Skin: Skin is warm and dry. No rash noted.   Cardiovascular: Normal heart rate noted  Respiratory: Normal respiratory effort, no problems with respiration noted  Abdomen: Soft, gravid, appropriate for gestational age.  Pain/Pressure: Absent     Pelvic: Cervical exam deferred        Extremities: Normal range of motion.  Edema: None  Mental Status:  Normal mood and affect. Normal behavior. Normal judgment and thought content.   Assessment and Plan:  Pregnancy: G1P0 at 352w5d1. Supervision of normal first pregnancy, antepartum     Doing well  2. Low vitamin D level     Taking weekly vitamin D  3. Rubella non-immune status, antepartum     MMR postpartum  4. Anemia affecting pregnancy in third trimester       - CBC  Preterm labor symptoms and general obstetric  precautions including but not limited to vaginal bleeding, contractions, leaking of fluid and fetal movement were reviewed in detail with the patient. Please refer to After Visit Summary for other counseling recommendations.  Return in about 1 week (around 03/30/2017) for ROB.   RaMorene CrockerCNM

## 2017-03-24 LAB — CBC
Hematocrit: 28.2 % — ABNORMAL LOW (ref 34.0–46.6)
Hemoglobin: 9 g/dL — ABNORMAL LOW (ref 11.1–15.9)
MCH: 29 pg (ref 26.6–33.0)
MCHC: 31.9 g/dL (ref 31.5–35.7)
MCV: 91 fL (ref 79–97)
PLATELETS: 314 10*3/uL (ref 150–379)
RBC: 3.1 x10E6/uL — AB (ref 3.77–5.28)
RDW: 14.1 % (ref 12.3–15.4)
WBC: 7 10*3/uL (ref 3.4–10.8)

## 2017-03-28 ENCOUNTER — Other Ambulatory Visit: Payer: Self-pay | Admitting: Certified Nurse Midwife

## 2017-03-29 ENCOUNTER — Ambulatory Visit (INDEPENDENT_AMBULATORY_CARE_PROVIDER_SITE_OTHER): Payer: Medicaid Other | Admitting: Certified Nurse Midwife

## 2017-03-29 ENCOUNTER — Encounter: Payer: Self-pay | Admitting: Certified Nurse Midwife

## 2017-03-29 VITALS — BP 138/86 | HR 88 | Wt 222.4 lb

## 2017-03-29 DIAGNOSIS — R7989 Other specified abnormal findings of blood chemistry: Secondary | ICD-10-CM

## 2017-03-29 DIAGNOSIS — Z2839 Other underimmunization status: Secondary | ICD-10-CM

## 2017-03-29 DIAGNOSIS — O9989 Other specified diseases and conditions complicating pregnancy, childbirth and the puerperium: Secondary | ICD-10-CM

## 2017-03-29 DIAGNOSIS — Z283 Underimmunization status: Secondary | ICD-10-CM

## 2017-03-29 DIAGNOSIS — Z3400 Encounter for supervision of normal first pregnancy, unspecified trimester: Secondary | ICD-10-CM

## 2017-03-29 MED ORDER — VITAMIN D (ERGOCALCIFEROL) 1.25 MG (50000 UNIT) PO CAPS: 50000.0000 [IU] | ORAL_CAPSULE | ORAL | 2 refills | Status: DC

## 2017-03-29 NOTE — Progress Notes (Signed)
Patient reports good fetal movement, denies pain. 

## 2017-03-29 NOTE — Progress Notes (Signed)
   PRENATAL VISIT NOTE  Subjective:  Heather Noble is a 21 y.o. G1P0 at 32w4dbeing seen today for ongoing prenatal care.  She is currently monitored for the following issues for this low-risk pregnancy and has Migraine with aura and without status migrainosus, not intractable; Supervision of normal first pregnancy, antepartum; Sickle cell trait ([place]; Low vitamin D level; Rubella non-immune status, antepartum; and Anemia affecting pregnancy in third trimester on her problem list.  Patient reports no complaints.  Contractions: Not present. Vag. Bleeding: None.  Movement: Present. Denies leaking of fluid.   The following portions of the patient's history were reviewed and updated as appropriate: allergies, current medications, past family history, past medical history, past social history, past surgical history and problem list. Problem list updated.  Objective:   Vitals:   03/29/17 1319  BP: 138/86  Pulse: 88  Weight: 222 lb 6.4 oz (100.9 kg)    Fetal Status: Fetal Heart Rate (bpm): 154; doppler Fundal Height: 40 cm Movement: Present     General:  Alert, oriented and cooperative. Patient is in no acute distress.  Skin: Skin is warm and dry. No rash noted.   Cardiovascular: Normal heart rate noted  Respiratory: Normal respiratory effort, no problems with respiration noted  Abdomen: Soft, gravid, appropriate for gestational age.  Pain/Pressure: Absent     Pelvic: Cervical exam deferred        Extremities: Normal range of motion.  Edema: None  Mental Status:  Normal mood and affect. Normal behavior. Normal judgment and thought content.   Assessment and Plan:  Pregnancy: G1P0 at 373w4d1. Supervision of normal first pregnancy, antepartum      Doing well  2. Low vitamin D level     Taking weekly vitamin D - Vitamin D, Ergocalciferol, (DRISDOL) 50000 units CAPS capsule; Take 1 capsule (50,000 Units total) by mouth every 7 (seven) days.  Dispense: 30 capsule; Refill: 2  3.  Rubella non-immune status, antepartum     MMR postpartum  Term labor symptoms and general obstetric precautions including but not limited to vaginal bleeding, contractions, leaking of fluid and fetal movement were reviewed in detail with the patient. Please refer to After Visit Summary for other counseling recommendations.  Return in about 1 week (around 04/05/2017) for ROB.   RaMorene CrockerCNM

## 2017-04-05 ENCOUNTER — Ambulatory Visit (INDEPENDENT_AMBULATORY_CARE_PROVIDER_SITE_OTHER): Payer: Self-pay | Admitting: Certified Nurse Midwife

## 2017-04-05 ENCOUNTER — Encounter: Payer: Self-pay | Admitting: Certified Nurse Midwife

## 2017-04-05 VITALS — BP 123/83 | HR 80 | Wt 223.0 lb

## 2017-04-05 DIAGNOSIS — R7989 Other specified abnormal findings of blood chemistry: Secondary | ICD-10-CM

## 2017-04-05 DIAGNOSIS — Z3400 Encounter for supervision of normal first pregnancy, unspecified trimester: Secondary | ICD-10-CM

## 2017-04-05 DIAGNOSIS — O99013 Anemia complicating pregnancy, third trimester: Secondary | ICD-10-CM

## 2017-04-05 NOTE — Progress Notes (Signed)
   PRENATAL VISIT NOTE  Subjective:  Heather Noble is a 21 y.o. G1P0 at 7082w4d being seen today for ongoing prenatal care.  She is currently monitored for the following issues for this low-risk pregnancy and has Migraine with aura and without status migrainosus, not intractable; Supervision of normal first pregnancy, antepartum; Sickle cell trait (HCC); Low vitamin D level; Rubella non-immune status, antepartum; and Anemia affecting pregnancy in third trimester on her problem list.  Patient reports no complaints.  Contractions: Irregular. Vag. Bleeding: None.  Movement: Present. Denies leaking of fluid.   The following portions of the patient's history were reviewed and updated as appropriate: allergies, current medications, past family history, past medical history, past social history, past surgical history and problem list. Problem list updated.  Objective:   Vitals:   04/05/17 1405  BP: 123/83  Pulse: 80  Weight: 223 lb (101.2 kg)    Fetal Status: Fetal Heart Rate (bpm): 147; doppler Fundal Height: 38 cm Movement: Present     General:  Alert, oriented and cooperative. Patient is in no acute distress.  Skin: Skin is warm and dry. No rash noted.   Cardiovascular: Normal heart rate noted  Respiratory: Normal respiratory effort, no problems with respiration noted  Abdomen: Soft, gravid, appropriate for gestational age.  Pain/Pressure: Absent     Pelvic: Cervical exam deferred        Extremities: Normal range of motion.  Edema: None  Mental Status:  Normal mood and affect. Normal behavior. Normal judgment and thought content.   Assessment and Plan:  Pregnancy: G1P0 at 6282w4d  1. Supervision of normal first pregnancy, antepartum     Doing well  2. Low vitamin D level     Taking weekly vitamin D  3. Anemia affecting pregnancy in third trimester      Taking Bloom  Preterm labor symptoms and general obstetric precautions including but not limited to vaginal bleeding,  contractions, leaking of fluid and fetal movement were reviewed in detail with the patient. Please refer to After Visit Summary for other counseling recommendations.  Return in about 1 week (around 04/12/2017) for ROB.   Roe Coombsachelle A Abran Gavigan, CNM

## 2017-04-12 ENCOUNTER — Encounter: Payer: Self-pay | Admitting: *Deleted

## 2017-04-16 ENCOUNTER — Other Ambulatory Visit: Payer: Self-pay

## 2017-04-16 ENCOUNTER — Encounter: Payer: Self-pay | Admitting: Certified Nurse Midwife

## 2017-04-16 ENCOUNTER — Ambulatory Visit (INDEPENDENT_AMBULATORY_CARE_PROVIDER_SITE_OTHER): Payer: Medicaid Other | Admitting: Certified Nurse Midwife

## 2017-04-16 ENCOUNTER — Encounter: Payer: Self-pay | Admitting: *Deleted

## 2017-04-16 VITALS — BP 131/89 | HR 79 | Wt 227.8 lb

## 2017-04-16 DIAGNOSIS — O9989 Other specified diseases and conditions complicating pregnancy, childbirth and the puerperium: Secondary | ICD-10-CM

## 2017-04-16 DIAGNOSIS — Z3403 Encounter for supervision of normal first pregnancy, third trimester: Secondary | ICD-10-CM

## 2017-04-16 DIAGNOSIS — R7989 Other specified abnormal findings of blood chemistry: Secondary | ICD-10-CM

## 2017-04-16 DIAGNOSIS — Z3400 Encounter for supervision of normal first pregnancy, unspecified trimester: Secondary | ICD-10-CM

## 2017-04-16 DIAGNOSIS — Z2839 Other underimmunization status: Secondary | ICD-10-CM

## 2017-04-16 DIAGNOSIS — Z283 Underimmunization status: Secondary | ICD-10-CM

## 2017-04-16 DIAGNOSIS — D649 Anemia, unspecified: Secondary | ICD-10-CM

## 2017-04-16 DIAGNOSIS — O99013 Anemia complicating pregnancy, third trimester: Secondary | ICD-10-CM

## 2017-04-16 NOTE — Progress Notes (Signed)
Postdates  

## 2017-04-16 NOTE — Progress Notes (Signed)
   PRENATAL VISIT NOTE  Subjective:  Heather Noble is a 21 y.o. G1P0 at 65w1dbeing seen today for ongoing prenatal care.  She is currently monitored for the following issues for this low-risk pregnancy and has Migraine with aura and without status migrainosus, not intractable; Supervision of normal first pregnancy, antepartum; Sickle cell trait ([place]; Low vitamin D level; Rubella non-immune status, antepartum; and Anemia affecting pregnancy in third trimester on their problem list.  Patient reports no complaints.  Contractions: Irregular. Vag. Bleeding: None.  Movement: Present. Denies leaking of fluid.   The following portions of the patient's history were reviewed and updated as appropriate: allergies, current medications, past family history, past medical history, past social history, past surgical history and problem list. Problem list updated.  Objective:   Vitals:   04/16/17 1513  BP: 131/89  Pulse: 79  Weight: 227 lb 12.8 oz (103.3 kg)    Fetal Status: Fetal Heart Rate (bpm): 130; doppler Fundal Height: 40 cm Movement: Present  Presentation: Vertex  General:  Alert, oriented and cooperative. Patient is in no acute distress.  Skin: Skin is warm and dry. No rash noted.   Cardiovascular: Normal heart rate noted  Respiratory: Normal respiratory effort, no problems with respiration noted  Abdomen: Soft, gravid, appropriate for gestational age.  Pain/Pressure: Absent     Pelvic: Cervical exam performed Dilation: 1 Effacement (%): 0 Station: -3  Extremities: Normal range of motion.  Edema: None  Mental Status:  Normal mood and affect. Normal behavior. Normal judgment and thought content.   Assessment and Plan:  Pregnancy: G1P0 at 434w1d1. Supervision of normal first pregnancy, antepartum     Doing well. Declines IOL at 41 weeks and wants to wait until closer to 42 weeks.  Desires waterbirth.   2. Rubella non-immune status, antepartum     MMR postpartum  3. Low vitamin D  level     Taking weekly vitamin D  4. Anemia affecting pregnancy in third trimester    Taking Bloom.   Term labor symptoms and general obstetric precautions including but not limited to vaginal bleeding, contractions, leaking of fluid and fetal movement were reviewed in detail with the patient. Please refer to After Visit Summary for other counseling recommendations.  Return in about 1 week (around 04/23/2017) for ROB, NST/AFI.   RaMorene CrockerCNM

## 2017-04-18 ENCOUNTER — Other Ambulatory Visit: Payer: Self-pay | Admitting: Certified Nurse Midwife

## 2017-04-18 ENCOUNTER — Telehealth (HOSPITAL_COMMUNITY): Payer: Self-pay | Admitting: *Deleted

## 2017-04-18 NOTE — Telephone Encounter (Signed)
Preadmission screen  

## 2017-04-20 ENCOUNTER — Inpatient Hospital Stay (HOSPITAL_COMMUNITY): Payer: Medicaid Other | Admitting: Anesthesiology

## 2017-04-20 ENCOUNTER — Inpatient Hospital Stay (HOSPITAL_COMMUNITY)
Admission: AD | Admit: 2017-04-20 | Discharge: 2017-04-20 | DRG: 788 | Discharge status: Against Medical Advice | Payer: Medicaid Other | Source: Ambulatory Visit | Attending: Obstetrics and Gynecology | Admitting: Obstetrics and Gynecology

## 2017-04-20 ENCOUNTER — Encounter (HOSPITAL_COMMUNITY): Payer: Self-pay | Admitting: *Deleted

## 2017-04-20 ENCOUNTER — Inpatient Hospital Stay (HOSPITAL_COMMUNITY)
Admission: AD | Admit: 2017-04-20 | Discharge: 2017-04-23 | Disposition: A | Discharge status: Home / Self Care | Payer: Medicaid Other | Source: Ambulatory Visit | Attending: Obstetrics & Gynecology | Admitting: Obstetrics & Gynecology

## 2017-04-20 ENCOUNTER — Other Ambulatory Visit: Payer: Self-pay

## 2017-04-20 ENCOUNTER — Encounter (HOSPITAL_COMMUNITY): Payer: Self-pay

## 2017-04-20 DIAGNOSIS — D573 Sickle-cell trait: Secondary | ICD-10-CM | POA: Diagnosis present

## 2017-04-20 DIAGNOSIS — Z8759 Personal history of other complications of pregnancy, childbirth and the puerperium: Secondary | ICD-10-CM | POA: Diagnosis present

## 2017-04-20 DIAGNOSIS — O9081 Anemia of the puerperium: Secondary | ICD-10-CM | POA: Diagnosis not present

## 2017-04-20 DIAGNOSIS — O480 Post-term pregnancy: Secondary | ICD-10-CM | POA: Diagnosis not present

## 2017-04-20 DIAGNOSIS — O134 Gestational [pregnancy-induced] hypertension without significant proteinuria, complicating childbirth: Secondary | ICD-10-CM | POA: Diagnosis present

## 2017-04-20 DIAGNOSIS — Z3A40 40 weeks gestation of pregnancy: Secondary | ICD-10-CM | POA: Diagnosis not present

## 2017-04-20 DIAGNOSIS — O139 Gestational [pregnancy-induced] hypertension without significant proteinuria, unspecified trimester: Secondary | ICD-10-CM | POA: Diagnosis present

## 2017-04-20 DIAGNOSIS — Z98891 History of uterine scar from previous surgery: Secondary | ICD-10-CM

## 2017-04-20 HISTORY — DX: Gestational (pregnancy-induced) hypertension without significant proteinuria, unspecified trimester: O13.9

## 2017-04-20 LAB — CBC
HCT: 29.3 % — ABNORMAL LOW (ref 36.0–46.0)
HCT: 28.1 % — ABNORMAL LOW (ref 36.0–46.0)
HEMOGLOBIN: 9.5 g/dL — AB (ref 12.0–15.0)
Hemoglobin: 9 g/dL — ABNORMAL LOW (ref 12.0–15.0)
MCH: 29.4 pg (ref 26.0–34.0)
MCH: 29 pg (ref 26.0–34.0)
MCHC: 32.4 g/dL (ref 30.0–36.0)
MCHC: 32 g/dL (ref 30.0–36.0)
MCV: 90.7 fL (ref 78.0–100.0)
MCV: 90.6 fL (ref 78.0–100.0)
PLATELETS: 308 10*3/uL (ref 150–400)
Platelets: 306 10*3/uL (ref 150–400)
RBC: 3.23 MIL/uL — ABNORMAL LOW (ref 3.87–5.11)
RBC: 3.1 MIL/uL — ABNORMAL LOW (ref 3.87–5.11)
RDW: 13.7 % (ref 11.5–15.5)
RDW: 13.7 % (ref 11.5–15.5)
WBC: 7.6 10*3/uL (ref 4.0–10.5)
WBC: 10.4 10*3/uL (ref 4.0–10.5)

## 2017-04-20 LAB — PROTEIN / CREATININE RATIO, URINE
Creatinine, Urine: 93 mg/dL
PROTEIN CREATININE RATIO: 0.12 mg/mg{creat} (ref 0.00–0.15)
TOTAL PROTEIN, URINE: 11 mg/dL

## 2017-04-20 LAB — COMPREHENSIVE METABOLIC PANEL
ALK PHOS: 245 U/L — AB (ref 38–126)
ALT: 12 U/L — ABNORMAL LOW (ref 14–54)
ANION GAP: 8 (ref 5–15)
AST: 20 U/L (ref 15–41)
Albumin: 3.2 g/dL — ABNORMAL LOW (ref 3.5–5.0)
BUN: 6 mg/dL (ref 6–20)
CALCIUM: 8.7 mg/dL — AB (ref 8.9–10.3)
CO2: 23 mmol/L (ref 22–32)
Chloride: 104 mmol/L (ref 101–111)
Creatinine, Ser: 0.68 mg/dL (ref 0.44–1.00)
GFR calc non Af Amer: >60 mL/min (ref >60)
Glucose, Bld: 81 mg/dL (ref 65–99)
POTASSIUM: 4.2 mmol/L (ref 3.5–5.1)
SODIUM: 135 mmol/L (ref 135–145)
TOTAL PROTEIN: 7.7 g/dL (ref 6.5–8.1)
Total Bilirubin: 0.8 mg/dL (ref 0.3–1.2)

## 2017-04-20 LAB — URINALYSIS, ROUTINE W REFLEX MICROSCOPIC
Bilirubin Urine: NEGATIVE
Glucose, UA: NEGATIVE mg/dL
Hgb urine dipstick: NEGATIVE
KETONES UR: NEGATIVE mg/dL
Nitrite: NEGATIVE
PROTEIN: NEGATIVE mg/dL
Specific Gravity, Urine: 1.01 (ref 1.005–1.030)
pH: 6 (ref 5.0–8.0)

## 2017-04-20 LAB — ABO/RH: ABO/RH(D): A POS

## 2017-04-20 LAB — TYPE AND SCREEN
ABO/RH(D): A POS
ANTIBODY SCREEN: NEGATIVE

## 2017-04-20 MED ORDER — LACTATED RINGERS IV SOLN
INTRAVENOUS | Status: DC
  Administered 2017-04-20: 17:00:00 via INTRAVENOUS

## 2017-04-20 MED ORDER — FENTANYL 2.5 MCG/ML BUPIVACAINE 1/10 % EPIDURAL INFUSION (WH - ANES)
INTRAMUSCULAR | Status: AC
  Filled 2017-04-20: qty 100

## 2017-04-20 MED ORDER — OXYCODONE-ACETAMINOPHEN 5-325 MG PO TABS: 2.0000 | ORAL_TABLET | ORAL | Status: DC | PRN

## 2017-04-20 MED ORDER — LACTATED RINGERS IV SOLN: 500.0000 mL | INTRAVENOUS | Status: DC | PRN

## 2017-04-20 MED ORDER — DIPHENHYDRAMINE HCL 50 MG/ML IJ SOLN: 12.5000 mg | INTRAMUSCULAR | Status: DC | PRN

## 2017-04-20 MED ORDER — LACTATED RINGERS IV SOLN
500.0000 mL | Freq: Once | INTRAVENOUS | Status: AC
  Administered 2017-04-20: 500 mL via INTRAVENOUS

## 2017-04-20 MED ORDER — SOD CITRATE-CITRIC ACID 500-334 MG/5ML PO SOLN
30.0000 mL | ORAL | Status: DC | PRN
Start: 2017-04-20 — End: 2017-04-21
  Administered 2017-04-21: 30 mL via ORAL
  Filled 2017-04-20: qty 15

## 2017-04-20 MED ORDER — OXYCODONE-ACETAMINOPHEN 5-325 MG PO TABS: 1.0000 | ORAL_TABLET | ORAL | Status: DC | PRN

## 2017-04-20 MED ORDER — PHENYLEPHRINE 40 MCG/ML (10ML) SYRINGE FOR IV PUSH (FOR BLOOD PRESSURE SUPPORT)
PREFILLED_SYRINGE | INTRAVENOUS | Status: AC
  Filled 2017-04-20: qty 20

## 2017-04-20 MED ORDER — LIDOCAINE HCL (PF) 1 % IJ SOLN
30.0000 mL | INTRAMUSCULAR | Status: DC | PRN
  Filled 2017-04-20: qty 30

## 2017-04-20 MED ORDER — PHENYLEPHRINE 40 MCG/ML (10ML) SYRINGE FOR IV PUSH (FOR BLOOD PRESSURE SUPPORT): 80.0000 ug | PREFILLED_SYRINGE | INTRAVENOUS | Status: DC | PRN

## 2017-04-20 MED ORDER — EPHEDRINE 5 MG/ML INJ: 10.0000 mg | INTRAVENOUS | Status: DC | PRN

## 2017-04-20 MED ORDER — ZOLPIDEM TARTRATE 5 MG PO TABS
5.0000 mg | ORAL_TABLET | Freq: Once | ORAL | Status: AC
  Administered 2017-04-20: 5 mg via ORAL
  Filled 2017-04-20: qty 1

## 2017-04-20 MED ORDER — FENTANYL CITRATE (PF) 100 MCG/2ML IJ SOLN
100.0000 ug | INTRAMUSCULAR | Status: DC | PRN
  Administered 2017-04-20: 100 ug via INTRAVENOUS
  Filled 2017-04-20: qty 2

## 2017-04-20 MED ORDER — OXYTOCIN 40 UNITS IN LACTATED RINGERS INFUSION - SIMPLE MED
2.5000 [IU]/h | INTRAVENOUS | Status: DC
  Filled 2017-04-20: qty 1000

## 2017-04-20 MED ORDER — FENTANYL 2.5 MCG/ML BUPIVACAINE 1/10 % EPIDURAL INFUSION (WH - ANES)
14.0000 mL/h | INTRAMUSCULAR | Status: DC | PRN
  Administered 2017-04-20 – 2017-04-21 (×2): 14 mL/h via EPIDURAL
  Filled 2017-04-20: qty 100

## 2017-04-20 MED ORDER — ONDANSETRON HCL 4 MG/2ML IJ SOLN
4.0000 mg | Freq: Four times a day (QID) | INTRAMUSCULAR | Status: DC | PRN
  Administered 2017-04-20: 4 mg via INTRAVENOUS
  Filled 2017-04-20: qty 2

## 2017-04-20 MED ORDER — EPHEDRINE 5 MG/ML INJ
10.0000 mg | INTRAVENOUS | Status: DC | PRN
Start: 2017-04-20 — End: 2017-04-21

## 2017-04-20 MED ORDER — OXYTOCIN BOLUS FROM INFUSION: 500.0000 mL | Freq: Once | INTRAVENOUS | Status: DC

## 2017-04-20 MED ORDER — ACETAMINOPHEN 325 MG PO TABS: 650.0000 mg | ORAL_TABLET | ORAL | Status: DC | PRN

## 2017-04-20 MED ORDER — LIDOCAINE HCL (PF) 1 % IJ SOLN
INTRAMUSCULAR | Status: DC | PRN
  Administered 2017-04-20: 9 mL via EPIDURAL

## 2017-04-20 NOTE — MAU Provider Note (Signed)
History     CSN: [id number]  Arrival date and time: 04/20/17 1008   First Provider Initiated Contact with Patient 04/20/17 1034      Chief Complaint  Patient presents with  . Labor Eval   Heather Noble is a 21 y.o. G1P0 at 5765w5d who presents today for labor evaluation. She states that she was having contractions yesterday, but this morning they were more painful. She also states that they were closer together today as well. She denies any VB or LOF. She reports normal fetal movement. RN asked me to see the patient due to elevated blood pressure today. No elevated blood pressure on prenatal flowsheet. She denies any HA, visual disturbances or RUQ pain. She denies any complications with this pregnancy at this time.     Past Medical History:  Diagnosis Date  . Headache   . Pregnancy induced hypertension     Past Surgical History:  Procedure Laterality Date  . NO PAST SURGERIES      Family History  Problem Relation Age of Onset  . Hypertension Maternal Grandmother   . Osteoarthritis Maternal Grandmother   . Gout Maternal Grandmother   . Thyroid disease Maternal Grandmother   . Diabetes Maternal Grandmother   . Cancer Paternal Grandfather        unknown    Social History   Tobacco Use  . Smoking status: Never Smoker  . Smokeless tobacco: Never Used  Substance Use Topics  . Alcohol use: No    Comment: occ  . Drug use: No    Allergies: No Known Allergies  Medications Prior to Admission  Medication Sig Dispense Refill Last Dose  . Elastic Bandages & Supports (COMFORT FIT MATERNITY SUPP LG) MISC 1 Units by Does not apply route daily. 1 each 0 Taking  . Prenat-FeAsp-Meth-FA-DHA w/o A (PRENATE PIXIE) 10-0.6-0.4-200 MG CAPS Take 1 tablet by mouth daily. 30 capsule 12 Taking  . Prenatal-DSS-FeCb-FeGl-FA (CITRANATAL BLOOM) 90-1 MG TABS Take 1 tablet by mouth daily. 30 tablet 12 Taking  . Vitamin D, Ergocalciferol, (DRISDOL) 50000 units CAPS capsule Take 1 capsule  (50,000 Units total) by mouth every 7 (seven) days. 30 capsule 2     Review of Systems  Constitutional: Negative for chills and fever.  Eyes: Negative for visual disturbance.  Gastrointestinal: Negative for diarrhea and vomiting.  Genitourinary: Positive for pelvic pain. Negative for vaginal bleeding and vaginal discharge.  Neurological: Negative for headaches.   Physical Exam   Blood pressure (!) 149/103, pulse (!) 102, temperature 99 F (37.2 C), temperature source Oral, resp. rate 18, last menstrual period 07/09/2016, SpO2 100 %.  Physical Exam  Nursing note and vitals reviewed. Constitutional: She is oriented to person, place, and time. She appears well-developed and well-nourished. No distress.  HENT:  Head: Normocephalic.  Cardiovascular: Normal rate.  GI: Soft. There is no tenderness. There is no rebound.  Neurological: She is alert and oriented to person, place, and time. She has normal reflexes. She exhibits normal muscle tone (no clonus ).  Skin: Skin is warm and dry.  Psychiatric: She has a normal mood and affect.   FHT 125, moderate with 15x15 accels, no decels Toco every 3-5 mins   Results for orders placed or performed during the hospital encounter of 04/20/17 (from the past 24 hour(s))  Protein / creatinine ratio, urine     Status: None   Collection Time: 04/20/17 10:24 AM  Result Value Ref Range   Creatinine, Urine 93.00 mg/dL   Total Protein, Urine  11 mg/dL   Protein Creatinine Ratio 0.12 0.00 - 0.15 mg/mg[Cre]  Urinalysis, Routine w reflex microscopic     Status: Abnormal   Collection Time: 04/20/17 10:24 AM  Result Value Ref Range   Color, Urine YELLOW YELLOW   APPearance CLEAR CLEAR   Specific Gravity, Urine 1.010 1.005 - 1.030   pH 6.0 5.0 - 8.0   Glucose, UA NEGATIVE NEGATIVE mg/dL   Hgb urine dipstick NEGATIVE NEGATIVE   Bilirubin Urine NEGATIVE NEGATIVE   Ketones, ur NEGATIVE NEGATIVE mg/dL   Protein, ur NEGATIVE NEGATIVE mg/dL   Nitrite  NEGATIVE NEGATIVE   Leukocytes, UA SMALL (A) NEGATIVE   RBC / HPF 0-5 0 - 5 RBC/hpf   WBC, UA 0-5 0 - 5 WBC/hpf   Bacteria, UA RARE (A) NONE SEEN   Squamous Epithelial / LPF 6-30 (A) NONE SEEN   Mucus PRESENT   CBC     Status: Abnormal   Collection Time: 04/20/17 11:13 AM  Result Value Ref Range   WBC 7.6 4.0 - 10.5 K/uL   RBC 3.23 (L) 3.87 - 5.11 MIL/uL   Hemoglobin 9.5 (L) 12.0 - 15.0 g/dL   HCT 95.629.3 (L) 21.336.0 - 08.646.0 %   MCV 90.7 78.0 - 100.0 fL   MCH 29.4 26.0 - 34.0 pg   MCHC 32.4 30.0 - 36.0 g/dL   RDW 57.813.7 46.911.5 - 62.915.5 %   Platelets 306 150 - 400 K/uL  Comprehensive metabolic panel     Status: Abnormal   Collection Time: 04/20/17 11:13 AM  Result Value Ref Range   Sodium 135 135 - 145 mmol/L   Potassium 4.2 3.5 - 5.1 mmol/L   Chloride 104 101 - 111 mmol/L   CO2 23 22 - 32 mmol/L   Glucose, Bld 81 65 - 99 mg/dL   BUN 6 6 - 20 mg/dL   Creatinine, Ser 5.280.68 0.44 - 1.00 mg/dL   Calcium 8.7 (L) 8.9 - 10.3 mg/dL   Total Protein 7.7 6.5 - 8.1 g/dL   Albumin 3.2 (L) 3.5 - 5.0 g/dL   AST 20 15 - 41 U/L   ALT 12 (L) 14 - 54 U/L   Alkaline Phosphatase 245 (H) 38 - 126 U/L   Total Bilirubin 0.8 0.3 - 1.2 mg/dL   GFR calc non Af Amer >60 >60 mL/min   GFR calc Af Amer >60 >60 mL/min   Anion gap 8 5 - 15    MAU Course  Procedures  MDM DW Dr. Macon LargeAnyanwu will admit for hypertension  Patient states that she would like to go home. Advised patient that we do not recommend that she go home at this time. DW patient the risk of seizure, stroke, fetal death that can occur without warning in women with pre-eclampsia. She verbalizes understand, and will "think about it". Patient advised she will have to sign out AMA if she wishes to leave.   Assessment and Plan  Pre-eclampsia without severe features Admit to labor and delivery  Patient left AMA   Heather ShellerHeather Kailie Noble 04/20/2017, 10:38 AM

## 2017-04-20 NOTE — Anesthesia Preprocedure Evaluation (Signed)
Anesthesia Evaluation  Patient identified by MRN, date of birth, ID band Patient awake    Reviewed: Allergy & Precautions, H&P , NPO status , Patient's Chart, lab work & pertinent test results  Airway Mallampati: II  TM Distance: >3 FB Neck ROM: full    Dental no notable dental hx. (+) Teeth Intact   Pulmonary neg pulmonary ROS,    Pulmonary exam normal breath sounds clear to auscultation       Cardiovascular hypertension, Normal cardiovascular exam Rhythm:regular Rate:Normal     Neuro/Psych negative psych ROS   GI/Hepatic negative GI ROS, Neg liver ROS,   Endo/Other  negative endocrine ROS  Renal/GU negative Renal ROS  negative genitourinary   Musculoskeletal negative musculoskeletal ROS (+)   Abdominal (+) + obese,   Peds  Hematology   Anesthesia Other Findings   Reproductive/Obstetrics (+) Pregnancy                             Anesthesia Physical Anesthesia Plan  ASA: III  Anesthesia Plan: Epidural   Post-op Pain Management:    Induction:   PONV Risk Score and Plan:   Airway Management Planned:   Additional Equipment:   Intra-op Plan:   Post-operative Plan:   Informed Consent: I have reviewed the patients History and Physical, chart, labs and discussed the procedure including the risks, benefits and alternatives for the proposed anesthesia with the patient or authorized representative who has indicated his/her understanding and acceptance.     Plan Discussed with:   Anesthesia Plan Comments:         Anesthesia Quick Evaluation

## 2017-04-20 NOTE — H&P (Signed)
Heather Noble is a 21 y.o. female presenting for early labor and new gestational hypertension Has been followed in [place] office and was planning waterbirth. Very disappointed that she cannot have one due to HTN Was seen in MAU at 1030 and advised to be admitted due to BPs of 149/103, 147/102, 150/94. Denies headache or visual changes  Patient Active Problem List   Diagnosis Date Noted  . Sickle cell trait (HCC) 11/03/2016  . Low vitamin D level 11/03/2016  . Rubella non-immune status, antepartum 11/03/2016  . Anemia affecting pregnancy in third trimester 11/03/2016  . Supervision of normal first pregnancy, antepartum 10/16/2016  . Migraine with aura and without status migrainosus, not intractable 10/16/2014   . OB History    Gravida Para Term Preterm AB Living   1 0 0 0 0 0   SAB TAB Ectopic Multiple Live Births   0 0 0 0 0     Past Medical History:  Diagnosis Date  . Headache   . Pregnancy induced hypertension    Past Surgical History:  Procedure Laterality Date  . NO PAST SURGERIES    . WISDOM TOOTH EXTRACTION  2015   Family History: family history includes Cancer in her paternal grandfather; Diabetes in her maternal grandmother; Gout in her maternal grandmother; Hypertension in her maternal grandmother; Osteoarthritis in her maternal grandmother; Thyroid disease in her maternal grandmother. Social History:  reports that  has never smoked. she has never used smokeless tobacco. She reports that she does not drink alcohol or use drugs.     Maternal Diabetes: No Genetic Screening: Normal Maternal Ultrasounds/Referrals: Normal Fetal Ultrasounds or other Referrals:  None Maternal Substance Abuse:  No Significant Maternal Medications:  None Significant Maternal Lab Results:  Lab values include: Group B Strep negative Other Comments:  None  Review of Systems  Constitutional: Negative for chills, fever and malaise/fatigue.  Respiratory: Negative for shortness of breath.    Cardiovascular: Negative for leg swelling.  Gastrointestinal: Positive for abdominal pain and nausea. Negative for constipation, diarrhea and vomiting.  Genitourinary: Negative for dysuria.  Musculoskeletal: Positive for back pain.  Neurological: Negative for headaches.   Maternal Medical History:  Reason for admission: Contractions and nausea.   Contractions: Onset was 3-5 hours ago.    Fetal activity: Perceived fetal activity is normal.   Last perceived fetal movement was within the past hour.    Prenatal complications: PIH and pre-eclampsia.   No bleeding.     Dilation: 1.5 Effacement (%): 50 Station: -2 Exam by:: Ferne CoeS. Earl, RN Blood pressure (!) 150/94, pulse 78, temperature 97.6 F (36.4 C), temperature source Oral, resp. rate 18, height 5\' 5"  (1.651 m), weight 227 lb (103 kg), last menstrual period 07/09/2016. Maternal Exam:  Uterine Assessment: Contraction strength is firm.  Contraction frequency is irregular.   Abdomen: Patient reports no abdominal tenderness. Fetal presentation: vertex  Introitus: Normal vulva. Normal vagina.  Cervix: Cervix evaluated by digital exam.     Fetal Exam Fetal Monitor Review: Mode: ultrasound.   Baseline rate: 140.  Variability: moderate (6-25 bpm).   Pattern: accelerations present and no decelerations.    Fetal State Assessment: Category I - tracings are normal.     Physical Exam  Constitutional: She is oriented to person, place, and time. She appears well-developed and well-nourished. No distress.  HENT:  Head: Normocephalic.  Cardiovascular: Normal rate.  Respiratory: Effort normal. No respiratory distress.  GI: Soft. There is no tenderness. There is no rebound and no guarding.  Genitourinary:  Genitourinary Comments: Dilation: 1.5 Effacement (%): 50 Cervical Position: Posterior Station: -2 Presentation: Vertex Exam by:: Ferne CoeS. Earl, RN   Musculoskeletal: Normal range of motion. She exhibits edema (trace).  Neurological:  She is alert and oriented to person, place, and time. She has normal reflexes.  Skin: Skin is warm and dry.  Psychiatric: She has a normal mood and affect.    Prenatal labs: ABO, Rh: A/Positive/-- (05/14 1102) Antibody: Negative (05/14 1102) Rubella: <0.90 (05/14 1102) RPR: Non Reactive (08/24 1100)  HBsAg: Negative (05/14 1102)  HIV:  Negative GBS: Negative (10/12 1127)   Assessment/Plan: Single IUP at 433w5d Gestational Hypertension   [facility] Routine orders for now Does not want augmentation right now Discussed water birth is contraindicated in hypertensive disorders of pregnancy Discussed option of augmentation of labor Discussed pain management options Reassuring fetal heart rate tracing, hopeful for vaginal delivery.    Wynelle BourgeoisMarie Williams 04/20/2017, 5:28 PM      Attestation of Attending Supervision of Advanced Practice Provider (PA/CNM/NP): Evaluation and management procedures were performed by the Advanced Practice Provider under my supervision and collaboration.  I have reviewed the Advanced Practice Provider's note and chart, and I agree with the management and plan.  Jaynie CollinsUGONNA  Augusten Lipkin, MD, FACOG Attending Obstetrician & Gynecologist Faculty Practice, [facility] [place]

## 2017-04-20 NOTE — MAU Note (Signed)
Started having ctx last night and got closer together today. NO LOF or bleeding

## 2017-04-20 NOTE — Progress Notes (Addendum)
LABOR PROGRESS NOTE  Heather Noble is a 21 y.o. G1P0000 at 642w5d  admitted for IOL for  GHTN/PreE.  Subjective: Patient appeared fairly uncomfortable. Requested sleep medication. Patient will get an epidural after initially refusing any augmentation and pain meds.  Objective: BP 139/69   Pulse 93   Temp 99.1 F (37.3 C) (Axillary)   Resp 20   Ht 5\' 5"  (1.651 m)   Wt 227 lb (103 kg)   LMP 07/09/2016   SpO2 98%   BMI 37.77 kg/m  or  Vitals:   04/20/17 1907 04/20/17 1912 04/20/17 1937 04/20/17 2202  BP:   (!) 148/88 139/69  Pulse:   67 93  Resp: 18 20    Temp:   99.1 F (37.3 C)   TempSrc:   Axillary   SpO2: 98%     Weight:      Height:        Last SVE: 2140 Dilation: 6 Effacement (%): 90 Cervical Position: Posterior Station: -1, 0 Presentation: Vertex Exam by:: [email or web address],rn  FHT: HR 120, moderate variability, +acel, variable decels  Labs: Lab Results  Component Value Date   WBC 10.4 04/20/2017   HGB 9.0 (L) 04/20/2017   HCT 28.1 (L) 04/20/2017   MCV 90.6 04/20/2017   PLT 308 04/20/2017    Patient Active Problem List   Diagnosis Date Noted  . Sickle cell trait (HCC) 11/03/2016  . Low vitamin D level 11/03/2016  . Rubella non-immune status, antepartum 11/03/2016  . Anemia affecting pregnancy in third trimester 11/03/2016  . Supervision of normal first pregnancy, antepartum 10/16/2016  . Migraine with aura and without status migrainosus, not intractable 10/16/2014    Assessment / Plan: 21 y.o. G1P0000 at 722w5d here for IOL for gHTN/PreE.  GHTN/PreE:Last BP 139/69, mildly elevated Labor: No augmentation Fetal Wellbeing: Cat 2 Pain Control:  Epidural Anticipated MOD: SVD  Lovena NeighboursAbdoulaye Jyquan Kenley, MD 04/20/2017, 10:13 PM

## 2017-04-20 NOTE — Anesthesia Procedure Notes (Addendum)
Epidural Patient location during procedure: OB Start time: 04/20/2017 10:18 PM End time: 04/20/2017 10:27 PM  Staffing Anesthesiologist: Leilani AbleHatchett, Joseantonio Dittmar, MD  Preanesthetic Checklist Completed: patient identified, site marked, surgical consent, pre-op evaluation, timeout performed, IV checked, risks and benefits discussed and monitors and equipment checked  Epidural Patient position: sitting Prep: site prepped and draped and DuraPrep Patient monitoring: continuous pulse ox and blood pressure Approach: midline Location: L3-L4 Injection technique: LOR air  Needle:  Needle type: Tuohy  Needle gauge: 17 G Needle length: 9 cm and 9 Needle insertion depth: 6 cm Catheter type: closed end flexible Catheter size: 19 Gauge Catheter at skin depth: 12 cm Test dose: negative and Other  Assessment Sensory level: T9 Events: blood not aspirated, injection not painful, no injection resistance, negative IV test and no paresthesia  Additional Notes Reason for block:procedure for pain

## 2017-04-20 NOTE — MAU Note (Signed)
Pt states the last few visits her BP was high at first and then retaken and lower and those were documented.

## 2017-04-20 NOTE — MAU Note (Signed)
Asked pt if she had made up her mind on admission and she said she didn't want to stay. I asked if the provider had explained the risks to herself and her baby. She said yes. I also again reminded her that she is leaving against medical advice and she is putting herself and baby at risk. She said she understood. I told her if she has any symptoms to return.

## 2017-04-21 ENCOUNTER — Encounter (HOSPITAL_COMMUNITY)
Admission: AD | Disposition: A | Discharge status: Home / Self Care | Payer: Self-pay | Source: Ambulatory Visit | Attending: Obstetrics & Gynecology

## 2017-04-21 ENCOUNTER — Encounter (HOSPITAL_COMMUNITY): Payer: Self-pay | Admitting: Obstetrics & Gynecology

## 2017-04-21 DIAGNOSIS — Z8759 Personal history of other complications of pregnancy, childbirth and the puerperium: Secondary | ICD-10-CM

## 2017-04-21 DIAGNOSIS — O139 Gestational [pregnancy-induced] hypertension without significant proteinuria, unspecified trimester: Secondary | ICD-10-CM | POA: Diagnosis present

## 2017-04-21 DIAGNOSIS — Z3A40 40 weeks gestation of pregnancy: Secondary | ICD-10-CM

## 2017-04-21 DIAGNOSIS — O134 Gestational [pregnancy-induced] hypertension without significant proteinuria, complicating childbirth: Secondary | ICD-10-CM

## 2017-04-21 HISTORY — DX: Personal history of other complications of pregnancy, childbirth and the puerperium: Z87.59

## 2017-04-21 LAB — CBC
HCT: 23.3 % — ABNORMAL LOW (ref 36.0–46.0)
HEMOGLOBIN: 7.6 g/dL — AB (ref 12.0–15.0)
MCH: 29.1 pg (ref 26.0–34.0)
MCHC: 32.6 g/dL (ref 30.0–36.0)
MCV: 89.3 fL (ref 78.0–100.0)
Platelets: 251 10*3/uL (ref 150–400)
RBC: 2.61 MIL/uL — AB (ref 3.87–5.11)
RDW: 14 % (ref 11.5–15.5)
WBC: 19.5 10*3/uL — ABNORMAL HIGH (ref 4.0–10.5)

## 2017-04-21 LAB — RPR: RPR: NONREACTIVE

## 2017-04-21 LAB — HIV ANTIBODY (ROUTINE TESTING W REFLEX): HIV Screen 4th Generation wRfx: NONREACTIVE

## 2017-04-21 SURGERY — Surgical Case
Anesthesia: Epidural

## 2017-04-21 MED ORDER — NALBUPHINE HCL 10 MG/ML IJ SOLN: 5.0000 mg | INTRAMUSCULAR | Status: DC | PRN

## 2017-04-21 MED ORDER — DIPHENHYDRAMINE HCL 50 MG/ML IJ SOLN: 12.5000 mg | INTRAMUSCULAR | Status: DC | PRN

## 2017-04-21 MED ORDER — MEPERIDINE HCL 25 MG/ML IJ SOLN
INTRAMUSCULAR | Status: DC | PRN
  Administered 2017-04-21: 12.5 mg via INTRAVENOUS

## 2017-04-21 MED ORDER — OXYTOCIN 10 UNIT/ML IJ SOLN
INTRAMUSCULAR | Status: AC
  Filled 2017-04-21: qty 4

## 2017-04-21 MED ORDER — OXYTOCIN 40 UNITS IN LACTATED RINGERS INFUSION - SIMPLE MED: 2.5000 [IU]/h | INTRAVENOUS | Status: AC

## 2017-04-21 MED ORDER — MEPERIDINE HCL 25 MG/ML IJ SOLN: 6.2500 mg | INTRAMUSCULAR | Status: DC | PRN

## 2017-04-21 MED ORDER — DIPHENHYDRAMINE HCL 50 MG/ML IJ SOLN
12.5000 mg | Freq: Four times a day (QID) | INTRAMUSCULAR | Status: DC | PRN
  Administered 2017-04-21: 12.5 mg via INTRAVENOUS
  Filled 2017-04-21: qty 1

## 2017-04-21 MED ORDER — DEXAMETHASONE SODIUM PHOSPHATE 4 MG/ML IJ SOLN
INTRAMUSCULAR | Status: DC | PRN
  Administered 2017-04-21: 4 mg via INTRAVENOUS

## 2017-04-21 MED ORDER — NALBUPHINE HCL 10 MG/ML IJ SOLN: 5.0000 mg | Freq: Once | INTRAMUSCULAR | Status: DC | PRN

## 2017-04-21 MED ORDER — MORPHINE SULFATE (PF) 0.5 MG/ML IJ SOLN
INTRAMUSCULAR | Status: DC | PRN
  Administered 2017-04-21: 3 mg via EPIDURAL

## 2017-04-21 MED ORDER — LIDOCAINE-EPINEPHRINE (PF) 2 %-1:200000 IJ SOLN
INTRAMUSCULAR | Status: DC | PRN
  Administered 2017-04-21 (×2): 3 mL via INTRADERMAL
  Administered 2017-04-21 (×2): 2 mL via INTRADERMAL

## 2017-04-21 MED ORDER — SIMETHICONE 80 MG PO CHEW
80.0000 mg | CHEWABLE_TABLET | ORAL | Status: DC
  Administered 2017-04-21: 80 mg via ORAL
  Filled 2017-04-21: qty 1

## 2017-04-21 MED ORDER — ZOLPIDEM TARTRATE 5 MG PO TABS: 5.0000 mg | ORAL_TABLET | Freq: Every evening | ORAL | Status: DC | PRN

## 2017-04-21 MED ORDER — LACTATED RINGERS IV SOLN
INTRAVENOUS | Status: DC
  Administered 2017-04-21: 06:00:00 via INTRAUTERINE

## 2017-04-21 MED ORDER — ENOXAPARIN SODIUM 40 MG/0.4ML ~~LOC~~ SOLN
40.0000 mg | SUBCUTANEOUS | Status: DC
  Filled 2017-04-21: qty 0.4

## 2017-04-21 MED ORDER — OXYTOCIN 10 UNIT/ML IJ SOLN
INTRAVENOUS | Status: DC | PRN
  Administered 2017-04-21: 40 [IU] via INTRAVENOUS

## 2017-04-21 MED ORDER — ACETAMINOPHEN 325 MG PO TABS
650.0000 mg | ORAL_TABLET | ORAL | Status: DC | PRN
  Administered 2017-04-23: 650 mg via ORAL
  Filled 2017-04-21: qty 2

## 2017-04-21 MED ORDER — ONDANSETRON HCL 4 MG PO TABS: 4.0000 mg | ORAL_TABLET | Freq: Three times a day (TID) | ORAL | Status: DC | PRN

## 2017-04-21 MED ORDER — TETANUS-DIPHTH-ACELL PERTUSSIS 5-2.5-18.5 LF-MCG/0.5 IM SUSP: 0.5000 mL | Freq: Once | INTRAMUSCULAR | Status: DC

## 2017-04-21 MED ORDER — DEXTROSE 5 % IV SOLN
500.0000 mg | INTRAVENOUS | Status: AC
  Administered 2017-04-21: 250 mg via INTRAVENOUS
  Filled 2017-04-21: qty 500

## 2017-04-21 MED ORDER — KETOROLAC TROMETHAMINE 30 MG/ML IJ SOLN
30.0000 mg | Freq: Four times a day (QID) | INTRAMUSCULAR | Status: DC | PRN
  Administered 2017-04-21: 30 mg via INTRAMUSCULAR

## 2017-04-21 MED ORDER — ONDANSETRON HCL 4 MG/2ML IJ SOLN: 4.0000 mg | Freq: Four times a day (QID) | INTRAMUSCULAR | Status: DC | PRN

## 2017-04-21 MED ORDER — BUPIVACAINE HCL (PF) 0.5 % IJ SOLN
INTRAMUSCULAR | Status: DC | PRN
  Administered 2017-04-21: 30 mL

## 2017-04-21 MED ORDER — KETOROLAC TROMETHAMINE 30 MG/ML IJ SOLN: 30.0000 mg | Freq: Four times a day (QID) | INTRAMUSCULAR | Status: DC | PRN

## 2017-04-21 MED ORDER — DIPHENHYDRAMINE HCL 25 MG PO CAPS
25.0000 mg | ORAL_CAPSULE | ORAL | Status: DC | PRN
  Filled 2017-04-21: qty 1

## 2017-04-21 MED ORDER — LACTATED RINGERS IV SOLN
INTRAVENOUS | Status: DC | PRN
  Administered 2017-04-21: 06:00:00 via INTRAVENOUS

## 2017-04-21 MED ORDER — PRENATAL MULTIVITAMIN CH
1.0000 | ORAL_TABLET | Freq: Every day | ORAL | Status: DC
  Administered 2017-04-22 – 2017-04-23 (×2): 1 via ORAL
  Filled 2017-04-21 (×2): qty 1

## 2017-04-21 MED ORDER — CEFAZOLIN SODIUM-DEXTROSE 2-4 GM/100ML-% IV SOLN
2.0000 g | INTRAVENOUS | Status: AC
  Administered 2017-04-21: 2 g via INTRAVENOUS

## 2017-04-21 MED ORDER — OXYCODONE-ACETAMINOPHEN 5-325 MG PO TABS
2.0000 | ORAL_TABLET | ORAL | Status: DC | PRN
  Administered 2017-04-22 – 2017-04-23 (×5): 2 via ORAL
  Filled 2017-04-21 (×6): qty 2

## 2017-04-21 MED ORDER — DEXAMETHASONE SODIUM PHOSPHATE 4 MG/ML IJ SOLN
INTRAMUSCULAR | Status: AC
  Filled 2017-04-21: qty 1

## 2017-04-21 MED ORDER — CEFAZOLIN SODIUM-DEXTROSE 2-3 GM-%(50ML) IV SOLR
INTRAVENOUS | Status: AC
Start: 2017-04-21 — End: 2017-04-21
  Filled 2017-04-21: qty 50

## 2017-04-21 MED ORDER — SCOPOLAMINE 1 MG/3DAYS TD PT72: 1.0000 | MEDICATED_PATCH | Freq: Once | TRANSDERMAL | Status: DC

## 2017-04-21 MED ORDER — MAGNESIUM HYDROXIDE 400 MG/5ML PO SUSP: 30.0000 mL | ORAL | Status: DC | PRN

## 2017-04-21 MED ORDER — MENTHOL 3 MG MT LOZG: 1.0000 | LOZENGE | OROMUCOSAL | Status: DC | PRN

## 2017-04-21 MED ORDER — NALOXONE HCL 0.4 MG/ML IJ SOLN: 0.4000 mg | INTRAMUSCULAR | Status: DC | PRN

## 2017-04-21 MED ORDER — SCOPOLAMINE 1 MG/3DAYS TD PT72
MEDICATED_PATCH | TRANSDERMAL | Status: AC
  Filled 2017-04-21: qty 1

## 2017-04-21 MED ORDER — KETOROLAC TROMETHAMINE 30 MG/ML IJ SOLN
INTRAMUSCULAR | Status: AC
  Filled 2017-04-21: qty 1

## 2017-04-21 MED ORDER — DIPHENHYDRAMINE HCL 25 MG PO CAPS
25.0000 mg | ORAL_CAPSULE | Freq: Four times a day (QID) | ORAL | Status: DC | PRN
  Administered 2017-04-21 (×2): 25 mg via ORAL
  Filled 2017-04-21 (×2): qty 1

## 2017-04-21 MED ORDER — WITCH HAZEL-GLYCERIN EX PADS: 1.0000 "application " | MEDICATED_PAD | CUTANEOUS | Status: DC | PRN

## 2017-04-21 MED ORDER — LACTATED RINGERS IV SOLN: INTRAVENOUS | Status: DC

## 2017-04-21 MED ORDER — DIBUCAINE 1 % RE OINT: 1.0000 "application " | TOPICAL_OINTMENT | RECTAL | Status: DC | PRN

## 2017-04-21 MED ORDER — PROMETHAZINE HCL 25 MG/ML IJ SOLN: 6.2500 mg | INTRAMUSCULAR | Status: DC | PRN

## 2017-04-21 MED ORDER — BUPIVACAINE HCL (PF) 0.5 % IJ SOLN
INTRAMUSCULAR | Status: AC
  Filled 2017-04-21: qty 30

## 2017-04-21 MED ORDER — SIMETHICONE 80 MG PO CHEW: 80.0000 mg | CHEWABLE_TABLET | ORAL | Status: DC | PRN

## 2017-04-21 MED ORDER — LACTATED RINGERS IV SOLN
INTRAVENOUS | Status: DC | PRN
  Administered 2017-04-21: 08:00:00 via INTRAVENOUS

## 2017-04-21 MED ORDER — ONDANSETRON HCL 4 MG/2ML IJ SOLN
INTRAMUSCULAR | Status: DC | PRN
  Administered 2017-04-21: 4 mg via INTRAVENOUS

## 2017-04-21 MED ORDER — MEPERIDINE HCL 25 MG/ML IJ SOLN
INTRAMUSCULAR | Status: AC
  Filled 2017-04-21: qty 1

## 2017-04-21 MED ORDER — OXYCODONE-ACETAMINOPHEN 5-325 MG PO TABS
1.0000 | ORAL_TABLET | ORAL | Status: DC | PRN
  Administered 2017-04-23: 1 via ORAL

## 2017-04-21 MED ORDER — FENTANYL CITRATE (PF) 100 MCG/2ML IJ SOLN
INTRAMUSCULAR | Status: AC
  Filled 2017-04-21: qty 2

## 2017-04-21 MED ORDER — TERBUTALINE SULFATE 1 MG/ML IJ SOLN
0.2500 mg | Freq: Once | INTRAMUSCULAR | Status: AC
  Administered 2017-04-21: 0.25 mg via SUBCUTANEOUS

## 2017-04-21 MED ORDER — SCOPOLAMINE 1 MG/3DAYS TD PT72
MEDICATED_PATCH | TRANSDERMAL | Status: DC | PRN
  Administered 2017-04-21: 1 via TRANSDERMAL

## 2017-04-21 MED ORDER — SODIUM CHLORIDE 0.9 % IR SOLN
Status: DC | PRN
  Administered 2017-04-21: 1

## 2017-04-21 MED ORDER — FENTANYL CITRATE (PF) 100 MCG/2ML IJ SOLN
INTRAMUSCULAR | Status: DC | PRN
  Administered 2017-04-21: 100 ug via EPIDURAL

## 2017-04-21 MED ORDER — FENTANYL CITRATE (PF) 100 MCG/2ML IJ SOLN: 25.0000 ug | INTRAMUSCULAR | Status: DC | PRN

## 2017-04-21 MED ORDER — IBUPROFEN 600 MG PO TABS
600.0000 mg | ORAL_TABLET | Freq: Four times a day (QID) | ORAL | Status: DC
  Administered 2017-04-21 – 2017-04-23 (×8): 600 mg via ORAL
  Filled 2017-04-21 (×8): qty 1

## 2017-04-21 MED ORDER — TERBUTALINE SULFATE 1 MG/ML IJ SOLN
INTRAMUSCULAR | Status: AC
  Filled 2017-04-21: qty 1

## 2017-04-21 MED ORDER — SENNOSIDES-DOCUSATE SODIUM 8.6-50 MG PO TABS
2.0000 | ORAL_TABLET | ORAL | Status: DC
  Administered 2017-04-21 – 2017-04-22 (×2): 2 via ORAL
  Filled 2017-04-21 (×2): qty 2

## 2017-04-21 MED ORDER — FERROUS SULFATE 325 (65 FE) MG PO TABS
325.0000 mg | ORAL_TABLET | Freq: Two times a day (BID) | ORAL | Status: DC
  Administered 2017-04-21: 325 mg via ORAL
  Filled 2017-04-21: qty 1

## 2017-04-21 MED ORDER — NALOXONE HCL 0.4 MG/ML IJ SOLN
1.0000 ug/kg/h | INTRAVENOUS | Status: DC | PRN
  Filled 2017-04-21: qty 5

## 2017-04-21 MED ORDER — MORPHINE SULFATE (PF) 0.5 MG/ML IJ SOLN
INTRAMUSCULAR | Status: AC
  Filled 2017-04-21: qty 10

## 2017-04-21 MED ORDER — COCONUT OIL OIL
1.0000 "application " | TOPICAL_OIL | Status: DC | PRN
  Administered 2017-04-21: 1 via TOPICAL
  Filled 2017-04-21: qty 120

## 2017-04-21 MED ORDER — MEASLES, MUMPS & RUBELLA VAC ~~LOC~~ INJ: 0.5000 mL | INJECTION | Freq: Once | SUBCUTANEOUS | Status: DC

## 2017-04-21 MED ORDER — ONDANSETRON HCL 4 MG/2ML IJ SOLN
INTRAMUSCULAR | Status: AC
  Filled 2017-04-21: qty 2

## 2017-04-21 MED ORDER — SODIUM CHLORIDE 0.9% FLUSH: 3.0000 mL | INTRAVENOUS | Status: DC | PRN

## 2017-04-21 MED ORDER — LACTATED RINGERS IV SOLN
INTRAVENOUS | Status: DC
  Administered 2017-04-21: 20:00:00 via INTRAVENOUS

## 2017-04-21 MED ORDER — ONDANSETRON HCL 4 MG/2ML IJ SOLN: 4.0000 mg | Freq: Three times a day (TID) | INTRAMUSCULAR | Status: DC | PRN

## 2017-04-21 SURGICAL SUPPLY — 32 items
CHLORAPREP W/TINT 26ML (MISCELLANEOUS) ×3 IMPLANT
CLAMP CORD UMBIL (MISCELLANEOUS) IMPLANT
CLOTH BEACON ORANGE TIMEOUT ST (SAFETY) ×3 IMPLANT
DECANTER SPIKE VIAL GLASS SM (MISCELLANEOUS) ×2 IMPLANT
DRSG OPSITE POSTOP 4X10 (GAUZE/BANDAGES/DRESSINGS) ×3 IMPLANT
ELECT REM PT RETURN 9FT ADLT (ELECTROSURGICAL) ×3
ELECTRODE REM PT RTRN 9FT ADLT (ELECTROSURGICAL) ×1 IMPLANT
EXTRACTOR VACUUM M CUP 4 TUBE (SUCTIONS) IMPLANT
EXTRACTOR VACUUM M CUP 4' TUBE (SUCTIONS)
GAUZE SPONGE 4X4 12PLY STRL LF (GAUZE/BANDAGES/DRESSINGS) ×4 IMPLANT
GLOVE BIOGEL PI IND STRL 7.0 (GLOVE) ×3 IMPLANT
GLOVE BIOGEL PI INDICATOR 7.0 (GLOVE) ×6
GLOVE ECLIPSE 7.0 STRL STRAW (GLOVE) ×3 IMPLANT
GOWN STRL REUS W/TWL LRG LVL3 (GOWN DISPOSABLE) ×6 IMPLANT
KIT ABG SYR 3ML LUER SLIP (SYRINGE) IMPLANT
NDL HYPO 25X5/8 SAFETYGLIDE (NEEDLE) ×1 IMPLANT
NEEDLE HYPO 22GX1.5 SAFETY (NEEDLE) ×3 IMPLANT
NEEDLE HYPO 25X5/8 SAFETYGLIDE (NEEDLE) ×6 IMPLANT
NS IRRIG 1000ML POUR BTL (IV SOLUTION) ×3 IMPLANT
PACK C SECTION WH (CUSTOM PROCEDURE TRAY) ×3 IMPLANT
PAD ABD 7.5X8 STRL (GAUZE/BANDAGES/DRESSINGS) ×3 IMPLANT
PAD OB MATERNITY 4.3X12.25 (PERSONAL CARE ITEMS) ×3 IMPLANT
PENCIL SMOKE EVAC W/HOLSTER (ELECTROSURGICAL) ×3 IMPLANT
RTRCTR C-SECT PINK 25CM LRG (MISCELLANEOUS) IMPLANT
SUT PDS AB 0 CTX 36 PDP370T (SUTURE) IMPLANT
SUT PLAIN 2 0 XLH (SUTURE) IMPLANT
SUT VIC AB 0 CTX 36 (SUTURE) ×12
SUT VIC AB 0 CTX36XBRD ANBCTRL (SUTURE) ×2 IMPLANT
SUT VIC AB 4-0 KS 27 (SUTURE) ×3 IMPLANT
SYR CONTROL 10ML LL (SYRINGE) ×3 IMPLANT
TOWEL OR 17X24 6PK STRL BLUE (TOWEL DISPOSABLE) ×3 IMPLANT
TRAY FOLEY BAG SILVER LF 14FR (SET/KITS/TRAYS/PACK) ×3 IMPLANT

## 2017-04-21 NOTE — Progress Notes (Signed)
Epidural catheter removed with tip intact. Bandaid applied to site.

## 2017-04-21 NOTE — Op Note (Signed)
Heather AlbeeNatalyia Noble PROCEDURE DATE: 04/21/2017  PREOPERATIVE DIAGNOSES: Intrauterine pregnancy at 8426w6d weeks gestation; gestational hypertension;  non-reassuring fetal status  POSTOPERATIVE DIAGNOSES: The same  PROCEDURE: Primary Low Transverse Cesarean Section  SURGEON:  Dr. Jaynie CollinsUgonna Oniya Mandarino  ASSISTANT:  Dr. Raynelle FanningJulie Degele  ANESTHESIOLOGY TEAM: Anesthesiologist: Leilani AbleHatchett, [place], MD; Shelton SilvasHollis, Kevin D, MD CRNA: Angela AdamWrinkle, Dana G, CRNA  INDICATIONS: Heather Noble is a 21 y.o. G1P0000 at 1826w6d here for cesarean section secondary to the indications listed under preoperative diagnoses; please see preoperative note for further details.  The risks of cesarean section were discussed with the patient including but were not limited to: bleeding which may require transfusion or reoperation; infection which may require antibiotics; injury to bowel, bladder, ureters or other surrounding organs; injury to the fetus; need for additional procedures including hysterectomy in the event of a life-threatening hemorrhage; placental abnormalities wth subsequent pregnancies, incisional problems, thromboembolic phenomenon and other postoperative/anesthesia complications.   The patient concurred with the proposed plan, giving informed written consent for the procedure.    FINDINGS:  Viable female infant in cephalic presentation.  Apgars 8 and 9.  Thick meconium in amniotic fluid. Body cord x 1.  Intact placenta, three vessel cord.  Normal uterus, fallopian tubes and ovaries bilaterally.  ANESTHESIA: Epidural  INTRAVENOUS FLUIDS: 1000 ml   ESTIMATED BLOOD LOSS: 708 ml URINE OUTPUT:  150 ml SPECIMENS: Placenta sent to pathology COMPLICATIONS: None immediate  PROCEDURE IN DETAIL:  The patient preoperatively received intravenous antibiotics and had sequential compression devices applied to her lower extremities.  She was then taken to the operating room where the epidural anesthesia was dosed up to surgical level and was  found to be adequate. She was then placed in a dorsal supine position with a leftward tilt, and prepped and draped in a sterile manner.  A foley catheter was placed into her bladder and attached to constant gravity.  After an adequate timeout was performed, a Pfannenstiel skin incision was made with scalpel and carried through to the underlying layer of fascia. The fascia was incised in the midline, and this incision was extended bilaterally using the Mayo scissors.  Kocher clamps were applied to the superior aspect of the fascial incision and the underlying rectus muscles were dissected off bluntly.  A similar process was carried out on the inferior aspect of the fascial incision. The rectus muscles were separated in the midline bluntly and the peritoneum was entered bluntly. Attention was turned to the lower uterine segment where a low transverse hysterotomy was made with a scalpel and extended bilaterally bluntly.  The infant was successfully delivered, the cord was clamped and cut, and the infant was handed over to the awaiting neonatology team. Uterine massage was then administered, and the placenta delivered intact with a three-vessel cord. The uterus was then cleared of clots and debris.  The hysterotomy was closed with 0 Vicryl in a running locked fashion, and an imbricating layer was also placed with 0 Vicryl.  Figure-of-eight 0 Vicryl serosal stitches were placed to help with hemostasis.  The pelvis was cleared of all clot and debris. Hemostasis was confirmed on all surfaces.  The peritoneum and the rectus muscles were reapproximated using 0 Vicryl interrupted stitches. The fascia was then closed using 0 Vicryl in a running fashion.  The subcutaneous layer was irrigated, then reapproximated with 2-0 plain gut interrupted stitches, and 30 ml of 0.5% Marcaine was injected subcutaneously around the incision.  The skin was closed with a 4-0 Vicryl subcuticular stitch. The  patient tolerated the procedure well.  Sponge, lap, instrument and needle counts were correct x 3.  She was taken to the recovery room in stable condition.    Jaynie CollinsUGONNA  Heather Michalowski, MD, FACOG Attending Obstetrician & Gynecologist Faculty Practice, [facility]

## 2017-04-21 NOTE — Lactation Note (Signed)
This note was copied from a baby's chart. Lactation Consultation Note  Patient Name: Heather Noble UJWJX'BToday's Date: 04/21/2017 Reason for consult: Primapara;1st time breastfeeding;Term  Visited with P1 Mom of 8 hr old baby.  Baby STS on FOB's chest.  Mom states that baby had just fed for 20 mins.  Noted some subtle cueing from baby.  Offered to assist with postioning and latch. Baby latched easily in football hold on left breast.  Hand expression reviewed, colostrum noted.  Mom denies discomfort with latch.  Explained importance of STS, and feeding often on cue, goal of 8-12 feedings per 24 hrs.   Breastfeeeding brochure left with Mom.  Mom aware of IP and OP lactation support available to her. Encouraged Mom to call for help prn.  Maternal Data Formula Feeding for Exclusion: Yes Reason for exclusion: Mother's choice to formula and breast feed on admission Has patient been taught Hand Expression?: Yes Does the patient have breastfeeding experience prior to this delivery?: No  Feeding Feeding Type: Breast Fed Length of feed: 20 min(per Mom)  LATCH Score Latch: Grasps breast easily, tongue down, lips flanged, rhythmical sucking.  Audible Swallowing: A few with stimulation  Type of Nipple: Everted at rest and after stimulation  Comfort (Breast/Nipple): Soft / non-tender  Hold (Positioning): Assistance needed to correctly position infant at breast and maintain latch.  LATCH Score: 8  Interventions Interventions: Breast feeding basics reviewed;Assisted with latch;Skin to skin;Breast massage;Hand express;Breast compression;Adjust position;Support pillows;Position options  Lactation Tools Discussed/Used WIC Program: No   Consult Status Consult Status: Follow-up Date: 04/22/17 Follow-up type: In-patient    Judee ClaraSmith, Loretto Belinsky E 04/21/2017, 4:28 PM

## 2017-04-21 NOTE — Addendum Note (Signed)
Addendum  created 04/21/17 1317 by Shanon PayorGregory, Johnathyn Viscomi M, CRNA   Sign clinical note

## 2017-04-21 NOTE — Progress Notes (Signed)
LABOR PROGRESS NOTE  Jonita Albeeatalyia Drummonds is a 21 y.o. G1P0000 at 1920w6d  admitted for IOL for GHTN  Subjective: In to evaluate given recurrent decels Pt on O2   Objective: BP 123/80   Pulse 77   Temp 99.2 F (37.3 C) (Axillary)   Resp 20   Ht 5\' 5"  (1.651 m)   Wt 227 lb (103 kg)   LMP 07/09/2016   SpO2 98%   BMI 37.77 kg/m  or  Vitals:   04/21/17 0330 04/21/17 0400 04/21/17 0431 04/21/17 0501  BP: (!) 116/95 126/79 129/86 123/80  Pulse: 82 80 72 77  Resp:      Temp:  99.2 F (37.3 C)    TempSrc:  Axillary    SpO2:      Weight:      Height:        SVE: 8/90/-2 FHT: baseline rate 120, moderate varibility, +acel, variable/late decel Toco: ctx q1-3 min  Assessment / Plan: 21 y.o. G1P0000 at 3920w6d admitted in early labor and PreE.  FHT Cat II, now with recurrent decels AROM with thick mec IUPC and FSE placed. Will start amnioinfusion. Discussed plan of care with patient and hopeful for SVD, but discussed possibility of C/S if FHT does no improve.   Frederik PearJulie P Deforrest Bogle, MD 04/21/2017, 5:19 AM

## 2017-04-21 NOTE — Anesthesia Postprocedure Evaluation (Signed)
Anesthesia Post Note  Patient: Heather Noble  Procedure(s) Performed: CESAREAN SECTION (N/A )     Patient location during evaluation: Mother Baby Anesthesia Type: Epidural Level of consciousness: awake and alert and oriented Pain management: pain level controlled Vital Signs Assessment: post-procedure vital signs reviewed and stable Respiratory status: spontaneous breathing and nonlabored ventilation Cardiovascular status: stable Postop Assessment: no headache, no backache, patient able to bend at knees, no signs of nausea or vomiting and adequate PO intake Anesthetic complications: no    Last Vitals:  Vitals:   04/21/17 1100 04/21/17 1200  BP: 135/77 132/68  Pulse: 76 83  Resp: 20 20  Temp: 37.3 C 37.3 C  SpO2: 100% 100%    Last Pain:  Vitals:   04/21/17 1200  TempSrc: Oral  PainSc:    Pain Goal: Patients Stated Pain Goal: 8 (04/20/17 1611)               Madison HickmanGREGORY,Layton Tappan

## 2017-04-21 NOTE — Progress Notes (Addendum)
FHT initially improved with amnioinfusion, but now recurrent variables and one prolonged decel. Terbutaline given once.  SVE unchanged from almost 2 hours ago. Discussed with patient indication for C/S.The risks of cesarean section discussed with the patient included but were not limited to: bleeding which may require transfusion or reoperation; infection which may require antibiotics; injury to bowel, bladder, ureters or other surrounding organs; injury to the fetus; need for additional procedures including hysterectomy in the event of a life-threatening hemorrhage; placental abnormalities wth subsequent pregnancies, incisional problems, thromboembolic phenomenon and other postoperative/anesthesia complications.   Almyra Free P. Degele, MD OB Fellow 04/21/2017, 6:59 AM    Attestation of Attending Supervision of Obstetric Fellow: Evaluation and management procedures were performed by the Obstetric Fellow under my supervision and collaboration.  I have reviewed the Obstetric Fellow's note and chart, and I agree with the management and plan.  Patient met, plan reviewed. All questions answered.  Anesthesia and OR aware. Preoperative prophylactic antibiotics and SCDs ordered on call to the OR.  To OR when ready.   Verita Schneiders, MD, [place] Attending [place], [facility]

## 2017-04-21 NOTE — Transfer of Care (Signed)
Immediate Anesthesia Transfer of Care Note  Patient: Heather AlbeeNatalyia Noble  Procedure(s) Performed: CESAREAN SECTION (N/A )  Patient Location: PACU  Anesthesia Type:Epidural  Level of Consciousness: awake and alert   Airway & Oxygen Therapy: Patient Spontanous Breathing  Post-op Assessment: Report given to RN and Post -op Vital signs reviewed and stable  Post vital signs: Reviewed  Last Vitals:  Vitals:   04/21/17 0600 04/21/17 0632  BP: 139/90 (!) 146/67  Pulse: (!) 103 (!) 116  Resp:    Temp:    SpO2:      Last Pain:  Vitals:   04/21/17 0531  TempSrc: Oral  PainSc:       Patients Stated Pain Goal: 8 (04/20/17 1611)  Complications: No apparent anesthesia complications

## 2017-04-21 NOTE — Progress Notes (Signed)
Called to patient's room because "IV was out". IV catheter found lying next to hand, apparently accidentally dislodged by patient.

## 2017-04-21 NOTE — Anesthesia Postprocedure Evaluation (Signed)
Anesthesia Post Note  Patient: Heather Noble  Procedure(s) Performed: CESAREAN SECTION (N/A )     Patient location during evaluation: PACU Anesthesia Type: Epidural Level of consciousness: oriented and awake and alert Pain management: pain level controlled Vital Signs Assessment: post-procedure vital signs reviewed and stable Respiratory status: spontaneous breathing, respiratory function stable and patient connected to nasal cannula oxygen Cardiovascular status: blood pressure returned to baseline and stable Postop Assessment: no headache, no backache, no apparent nausea or vomiting, epidural receding and patient able to bend at knees Anesthetic complications: no    Last Vitals:  Vitals:   04/21/17 1000 04/21/17 1100  BP: 127/81 135/77  Pulse: 81 76  Resp: 20 20  Temp: 37.1 C 37.3 C  SpO2: 100% 100%    Last Pain:  Vitals:   04/21/17 1100  TempSrc: Oral  PainSc:    Pain Goal: Patients Stated Pain Goal: 8 (04/20/17 1611)               Shelton SilvasKevin D Hollis

## 2017-04-22 ENCOUNTER — Encounter (HOSPITAL_COMMUNITY): Payer: Self-pay | Admitting: Anesthesiology

## 2017-04-22 LAB — CBC
HEMATOCRIT: 19.9 % — AB (ref 36.0–46.0)
Hemoglobin: 6.6 g/dL — CL (ref 12.0–15.0)
MCH: 30 pg (ref 26.0–34.0)
MCHC: 33.2 g/dL (ref 30.0–36.0)
MCV: 90.5 fL (ref 78.0–100.0)
Platelets: 242 10*3/uL (ref 150–400)
RBC: 2.2 MIL/uL — ABNORMAL LOW (ref 3.87–5.11)
RDW: 14 % (ref 11.5–15.5)
WBC: 17.6 10*3/uL — ABNORMAL HIGH (ref 4.0–10.5)

## 2017-04-22 LAB — CREATININE, SERUM
Creatinine, Ser: 0.82 mg/dL (ref 0.44–1.00)
GFR calc non Af Amer: >60 mL/min (ref >60)

## 2017-04-22 MED ORDER — POLYETHYLENE GLYCOL 3350 17 G PO PACK
17.0000 g | PACK | Freq: Every day | ORAL | Status: DC
  Administered 2017-04-22 – 2017-04-23 (×2): 17 g via ORAL
  Filled 2017-04-22 (×2): qty 1

## 2017-04-22 MED ORDER — FERROUS GLUCONATE 324 (38 FE) MG PO TABS
324.0000 mg | ORAL_TABLET | Freq: Two times a day (BID) | ORAL | Status: DC
  Administered 2017-04-22 – 2017-04-23 (×3): 324 mg via ORAL
  Filled 2017-04-22 (×6): qty 1

## 2017-04-22 MED ORDER — SIMETHICONE 80 MG PO CHEW
80.0000 mg | CHEWABLE_TABLET | Freq: Two times a day (BID) | ORAL | Status: DC
  Administered 2017-04-22 – 2017-04-23 (×3): 80 mg via ORAL
  Filled 2017-04-22 (×3): qty 1

## 2017-04-22 NOTE — Lactation Note (Signed)
This note was copied from a baby's chart. Lactation Consultation Note  Patient Name: Heather Noble  Baby receiving double lphototherapy.  Baby latching well per mom and he has also received two formula feeds today due to hunger.  Stressed importance of post pumping every 2-3 hours.  RN states she observed a good feeding today.  Encouraged to call with concerns/assist.   Maternal Data    Feeding    LATCH Score                   Interventions    Lactation Tools Discussed/Used     Consult Status      Huston FoleyMOULDEN, [name], 3:07 PM

## 2017-04-22 NOTE — Progress Notes (Signed)
Daily Post Partum Note  Heather Noble is a 21 y.o. G1P0001  POD#1 s/p pLTCS @ 24w0dfor NRFHT (EBL 7030m.  Pregnancy c/b [place] trait, gHTN IOL, migraines 24hr/overnight events:  none  Subjective:  No flatus yet and no n/v/fevers/chills/chest pain/sob. Pt ambulated some last night and states she felt fine and no s/s of anemia. No s/s of pre-x  Objective:    Current Vital Signs 24h Vital Sign Ranges  T 98.4 F (36.9 C) Temp  Avg: 98.6 F (37 C)  Min: 98.2 F (36.8 C)  Max: 99.2 F (37.3 C)  BP 128/66 BP  Min: 123/83  Max: 135/77  HR 86 Pulse  Avg: 85.6  Min: 76  Max: 103  RR 18 Resp  Avg: 19.5  Min: 18  Max: 24  SaO2 100 % Not Delivered SpO2  Avg: 99.6 %  Min: 98 %  Max: 100 %       24 Hour I/O Current Shift I/O  Time Ins Outs 11/17 0701 - 11/18 0700 In: 1000 [I.V.:1000] Out: 6008 [Urine:5300] No intake/output data recorded.    General: NAD Abdomen: +BS. Firm fundus below the umbilicus, nttp, nd. C/d/i dressing Perineum: deferred.  Skin:  Warm and dry.  Cardiovascular: S1, S2 normal, no murmur, rub or gallop, regular rate and rhythm Respiratory:  Clear to auscultation bilateral. Normal respiratory effort Extremities: no c/c/e  Medications Current Facility-Administered Medications  Medication Dose Route Frequency Provider Last Rate Last Dose  . acetaminophen (TYLENOL) tablet 650 mg  650 mg Oral Q4H PRN Anyanwu, Ugonna A, MD      . coconut oil  1 application Topical PRN Anyanwu, UgSallyanne HaversMD   1 application at [id number]/[id number]/[id number]  . witch hazel-glycerin (TUCKS) pad 1 application  1 application Topical PRN Anyanwu, Ugonna A, MD       And  . dibucaine (NUPERCAINAL) 1 % rectal ointment 1 application  1 application Rectal PRN Anyanwu, Ugonna A, MD      . diphenhydrAMINE (BENADRYL) capsule 25 mg  25 mg Oral Q6H PRN Anyanwu, Ugonna A, MD   25 mg at 04/21/17 1956  . diphenhydrAMINE (BENADRYL) injection 12.5 mg  12.5 mg Intravenous Q6H PRN PiAletha HalimMD   12.5 mg at 04/21/17  1708  . ferrous gluconate (FERGON) tablet 324 mg  324 mg Oral BID WC PiAletha HalimMD      . ibuprofen (ADVIL,MOTRIN) tablet 600 mg  600 mg Oral Q6H Anyanwu, Ugonna A, MD   600 mg at 04/22/17 0644  . lactated ringers infusion   Intravenous Continuous Anyanwu, UgSallyanne HaversMD 125 mL/hr at 04/21/17 1933    . magnesium hydroxide (MILK OF MAGNESIA) suspension 30 mL  30 mL Oral Q3 days PRN Anyanwu, Ugonna A, MD      . measles, mumps and rubella vaccine (MMR) injection 0.5 mL  0.5 mL Subcutaneous Once Anyanwu, Ugonna A, MD      . menthol-cetylpyridinium (CEPACOL) lozenge 3 mg  1 lozenge Oral Q2H PRN Anyanwu, Ugonna A, MD      . ondansetron (ZOFRAN) injection 4 mg  4 mg Intravenous Q6H PRN PiAletha HalimMD      . ondansetron (ZOFRAN) tablet 4 mg  4 mg Oral Q8H PRN PiAletha HalimMD      . oxyCODONE-acetaminophen (PERCOCET/ROXICET) 5-325 MG per tablet 1 tablet  1 tablet Oral Q4H PRN Anyanwu, Ugonna A, MD      . oxyCODONE-acetaminophen (PERCOCET/ROXICET) 5-325 MG per tablet 2 tablet  2 tablet Oral Q4H  PRN Anyanwu, Ugonna A, MD      . polyethylene glycol (MIRALAX / GLYCOLAX) packet 17 g  17 g Oral Daily Linnaea Ahn, Eduard Clos, MD      . prenatal multivitamin tablet 1 tablet  1 tablet Oral Q1200 Anyanwu, Ugonna A, MD      . senna-docusate (Senokot-S) tablet 2 tablet  2 tablet Oral Q24H Anyanwu, Sallyanne Havers, MD   2 tablet at 04/21/17 2306  . simethicone (MYLICON) chewable tablet 80 mg  80 mg Oral BID AC & HS Aletha Halim, MD      . Tdap (BOOSTRIX) injection 0.5 mL  0.5 mL Intramuscular Once Anyanwu, Ugonna A, MD      . zolpidem (AMBIEN) tablet 5 mg  5 mg Oral QHS PRN Osborne Oman, MD        Labs:  Recent Labs  Lab 04/20/17 1702 04/21/17 0935 04/22/17 0429  WBC 10.4 19.5* 17.6*  HGB 9.0* 7.6* 6.6*  HCT 28.1* 23.3* 19.9*  PLT 308 251 242   Recent Labs  Lab 04/20/17 1113 04/22/17 0429  NA 135  --   K 4.2  --   CL 104  --   CO2 23  --   BUN 6  --   CREATININE 0.68 0.82  GLUCOSE 81  --    CALCIUM 8.7*  --     Assessment & Plan:  Pt stable *Postpartum/postop: routine care. Foley just out. F/u void. Breast. D/w pt re: [facility] tomorrow. D/w pt re: MMR *Anemia: d/w her to walk with help and let us know any s/s of anemia. Recheck cbc tomorrow. Continue iron. Had good uop o/n. She's chronically anemic so likely will be fine with low counts *gHTN: no lingering s/s *Dispo: likely pod2-3  Durene Romans. MD Attending Center for [facility])

## 2017-04-22 NOTE — Progress Notes (Signed)
CRITICAL VALUE ALERT  Critical Value:  Hbg 6.6  Date & Time Notfied:  04/22/17 @ 0500  Provider Notified: Hilda LiasMarie  Orders Received/Actions taken: Take orthostatics in an hour. She will discuss with doctors this AM. Pt is asymptomatic at this time.

## 2017-04-23 ENCOUNTER — Encounter: Payer: Medicaid Other | Admitting: Certified Nurse Midwife

## 2017-04-23 ENCOUNTER — Other Ambulatory Visit: Payer: Self-pay

## 2017-04-23 ENCOUNTER — Other Ambulatory Visit: Payer: Medicaid Other

## 2017-04-23 LAB — CBC
HEMATOCRIT: 20.9 % — AB (ref 36.0–46.0)
Hemoglobin: 6.8 g/dL — CL (ref 12.0–15.0)
MCH: 29.6 pg (ref 26.0–34.0)
MCHC: 32.5 g/dL (ref 30.0–36.0)
MCV: 90.9 fL (ref 78.0–100.0)
Platelets: 272 10*3/uL (ref 150–400)
RBC: 2.3 MIL/uL — ABNORMAL LOW (ref 3.87–5.11)
RDW: 14.2 % (ref 11.5–15.5)
WBC: 14.7 10*3/uL — ABNORMAL HIGH (ref 4.0–10.5)

## 2017-04-23 MED ORDER — OXYCODONE-ACETAMINOPHEN 5-325 MG PO TABS: 1.0000 | ORAL_TABLET | ORAL | 0 refills | Status: DC | PRN

## 2017-04-23 MED ORDER — FERROUS SULFATE 325 (65 FE) MG PO TBEC: 325.0000 mg | DELAYED_RELEASE_TABLET | Freq: Two times a day (BID) | ORAL | 3 refills | Status: DC

## 2017-04-23 MED ORDER — DOCUSATE SODIUM 100 MG PO CAPS: 100.0000 mg | ORAL_CAPSULE | Freq: Two times a day (BID) | ORAL | Status: DC | PRN

## 2017-04-23 MED ORDER — SALINE SPRAY 0.65 % NA SOLN
2.0000 | NASAL | Status: DC | PRN
  Filled 2017-04-23: qty 44

## 2017-04-23 NOTE — Progress Notes (Signed)
Patient discharged, while infant stays as a baby patient. Discharge teaching, paperwork, home care, prescriptions, and follow-up appts reviewed. No questions from patient at this time.

## 2017-04-23 NOTE — Discharge Instructions (Signed)
Cesarean Delivery, Care After °Refer to this sheet in the next few weeks. These instructions provide you with information about caring for yourself after your procedure. Your health care provider may also give you more specific instructions. Your treatment has been planned according to current medical practices, but problems sometimes occur. Call your health care provider if you have any problems or questions after your procedure. °What can I expect after the procedure? °After the procedure, it is common to have: °· A small amount of blood or clear fluid coming from the incision. °· Some redness, swelling, and pain in your incision area. °· Some abdominal pain and soreness. °· Vaginal bleeding (lochia). °· Pelvic cramps. °· Fatigue. ° °Follow these instructions at home: °Incision care ° °· Follow instructions from your health care provider about how to take care of your incision. Make sure you: °? Wash your hands with soap and water before you change your bandage (dressing). If soap and water are not available, use hand sanitizer. °? Change your dressing as told by your health care provider. °? Leave stitches (sutures), skin staples, skin glue, or adhesive strips in place. These skin closures may need to stay in place for 2 weeks or longer. If adhesive strip edges start to loosen and curl up, you may trim the loose edges. Do not remove adhesive strips completely unless your health care provider tells you to do that. °· Check your incision area every day for signs of infection. Check for: °? More redness, swelling, or pain. °? More fluid or blood. °? Warmth. °? Pus or a bad smell. °· When you cough or sneeze, hug a pillow. This helps with pain and decreases the chance of your incision opening up (dehiscing). Do this until your incision heals. °Medicines °· Take over-the-counter and prescription medicines only as told by your health care provider. °· If you were prescribed an antibiotic medicine, take it as told by  your health care provider. Do not stop taking the antibiotic until it is finished. °Driving °· Do not drive or operate heavy machinery while taking prescription pain medicine. °· Do not drive for 24 hours if you received a sedative. °Lifestyle °· Do not drink alcohol. This is especially important if you are breastfeeding or taking pain medicine. °· Do not use tobacco products, including cigarettes, chewing tobacco, or e-cigarettes. If you need help quitting, ask your health care provider. Tobacco can delay wound healing. °Eating and drinking °· Drink at least 8 eight-ounce glasses of water every day unless told not to by your health care provider. If you breastfeed, you may need to drink more water than this. °· Eat high-fiber foods every day. These foods may help prevent or relieve constipation. High-fiber foods include: °? Whole grain cereals and breads. °? Brown rice. °? Beans. °? Fresh fruits and vegetables. °Activity °· Return to your normal activities as told by your health care provider. Ask your health care provider what activities are safe for you. °· Rest as much as possible. Try to rest or take a nap while your baby is sleeping. °· Do not lift anything that is heavier than your baby or 10 lb (4.5 kg) as told by your health care provider. °· Ask your health care provider when you can engage in sexual activity. This may depend on your: °? Risk of infection. °? Healing rate. °? Comfort and desire to engage in sexual activity. °Bathing °· Do not take baths, swim, or use a hot tub until your health care   provider approves. Ask your health care provider if you can take showers. You may only be allowed to take sponge baths until your incision heals.  Keep your dressing dry as told by your health care provider. General instructions  Do not use tampons or douches until your health care provider approves.  Wear: ? Loose, comfortable clothing. ? A supportive and well-fitting bra.  Watch for any blood clots  that may pass from your vagina. These may look like clumps of dark red, brown, or black discharge.  Keep your perineum clean and dry as told by your health care provider.  Wipe from front to back when you use the toilet.  If possible, have someone help you care for your baby and help with household activities for a few days after you leave the hospital.  Keep all follow-up visits for you and your baby as told by your health care provider. This is important. Contact a health care provider if:  You have: ? Bad-smelling vaginal discharge. ? Difficulty urinating. ? Pain when urinating. ? A sudden increase or decrease in the frequency of your bowel movements. ? More redness, swelling, or pain around your incision. ? More fluid or blood coming from your incision. ? Pus or a bad smell coming from your incision. ? A fever. ? A rash. ? Little or no interest in activities you used to enjoy. ? Questions about caring for yourself or your baby. ? Nausea.  Your incision feels warm to the touch.  Your breasts turn red or become painful or hard.  You feel unusually sad or worried.  You vomit.  You pass large blood clots from your vagina. If you pass a blood clot, save it to show to your health care provider. Do not flush blood clots down the toilet without showing your health care provider.  You urinate more than usual.  You are dizzy or light-headed.  You have not breastfed and have not had a menstrual period for 12 weeks after delivery.  You stopped breastfeeding and have not had a menstrual period for 12 weeks after stopping breastfeeding. Get help right away if:  You have: ? Pain that does not go away or get better with medicine. ? Chest pain. ? Difficulty breathing. ? Blurred vision or spots in your vision. ? Thoughts about hurting yourself or your baby. ? New pain in your abdomen or in one of your legs. ? A severe headache.  You faint.  You bleed from your vagina so much  that you fill two sanitary pads in one hour. This information is not intended to replace advice given to you by your health care provider. Make sure you discuss any questions you have with your health care provider. Document Released: 02/11/2002 Document Revised: 09/30/2015 Document Reviewed: 04/26/2015 Elsevier Interactive Patient Education  2017 [employer].  Iron Deficiency Anemia, Adult Iron deficiency anemia is a condition in which the concentration of red blood cells or hemoglobin in the blood is below normal because of too little iron. Hemoglobin is a substance in red blood cells that carries oxygen to the body's tissues. When the concentration of red blood cells or hemoglobin is too low, not enough oxygen reaches these tissues. Iron deficiency anemia is usually long-lasting (chronic) and it develops over time. It may or may not cause symptoms. It is a common type of anemia. What are the causes? This condition may be caused by:  Not enough iron in the diet.  Blood loss caused by bleeding in  the intestine.  Blood loss from a gastrointestinal condition like Crohn disease.  Frequent blood draws, such as from blood donation.  Abnormal absorption in the gut.  Heavy menstrual periods in women.  Cancers of the gastrointestinal system, such as colon cancer.  What are the signs or symptoms? Symptoms of this condition may include:  Fatigue.  Headache.  Pale skin, lips, and nail beds.  Poor appetite.  Weakness.  Shortness of breath.  Dizziness.  Cold hands and feet.  Fast or irregular heartbeat.  Irritability. This is more common in severe anemia.  Rapid breathing. This is more common in severe anemia.  Mild anemia may not cause any symptoms. How is this diagnosed? This condition is diagnosed based on:  Your medical history.  A physical exam.  Blood tests.  You may have additional tests to find the underlying cause of your anemia, such as:  Testing for  blood in the stool (fecal occult blood test).  A procedure to see inside your colon and rectum (colonoscopy).  A procedure to see inside your esophagus and stomach (endoscopy).  A test in which cells are removed from bone marrow (bone marrow aspiration) or fluid is removed from the bone marrow to be examined (biopsy). This is rarely needed.  How is this treated? This condition is treated by correcting the cause of your iron deficiency. Treatment may involve:  Adding iron-rich foods to your diet.  Taking iron supplements. If you are pregnant or breastfeeding, you may need to take extra iron because your normal diet usually does not provide the amount of iron that you need.  Increasing vitamin C intake. Vitamin C helps your body absorb iron. Your health care provider may recommend that you take iron supplements along with a glass of orange juice or a vitamin C supplement.  Medicines to make heavy menstrual flow lighter.  Surgery.  You may need repeat blood tests to determine whether treatment is working. Depending on the underlying cause, the anemia should be corrected within 2 months of starting treatment. If the treatment does not seem to be working, you may need more testing. Follow these instructions at home: Medicines  Take over-the-counter and prescription medicines only as told by your health care provider. This includes iron supplements and vitamins.  If you cannot tolerate taking iron supplements by mouth, talk with your health care provider about taking them through a vein (intravenously) or an injection into a muscle.  For the best iron absorption, you should take iron supplements when your stomach is empty. If you cannot tolerate them on an empty stomach, you may need to take them with food.  Do not drink milk or take antacids at the same time as your iron supplements. Milk and antacids may interfere with iron absorption.  Iron supplements can cause constipation. To prevent  constipation, include fiber in your diet as told by your health care provider. A stool softener may also be recommended. Eating and drinking  Talk with your health care provider before changing your diet. He or she may recommend that you eat foods that contain a lot of iron, such as: ? Liver. ? Low-fat (lean) beef. ? Breads and cereals that have iron added to them (are fortified). ? Eggs. ? Dried fruit. ? Dark green, leafy vegetables.  To help your body use the iron from iron-rich foods, eat those foods at the same time as fresh fruits and vegetables that are high in vitamin C. Foods that are high in vitamin C include: ?  Oranges. ? Peppers. ? Tomatoes. ? Mangoes.  Drinkenoughfluid to keep your urine clear or pale yellow. General instructions  Return to your normal activities as told by your health care provider. Ask your health care provider what activities are safe for you.  Practice good hygiene. Anemia can make you more prone to illness and infection.  Keep all follow-up visits as told by your health care provider. This is important. Contact a health care provider if:  You feel nauseous or you vomit.  You feel weak.  You have unexplained sweating.  You develop symptoms of constipation, such as: ? Having fewer than three bowel movements a week. ? Straining to have a bowel movement. ? Having stools that are hard, dry, or larger than normal. ? Feeling full or bloated. ? Pain in the lower abdomen. ? Not feeling relief after having a bowel movement. Get help right away if:  You faint. If this happens, do not drive yourself to the hospital. Call your local emergency services (911 in the [place]  You have chest pain.  You have shortness of breath that: ? Is severe. ? Gets worse with physical activity.  You have a rapid heartbeat.  You become light-headed when getting up from a sitting or lying down position. This information is not intended to replace advice given to  you by your health care provider. Make sure you discuss any questions you have with your health care provider. Document Released: 05/19/2000 Document Revised: 02/09/2016 Document Reviewed: 02/09/2016 Elsevier Interactive Patient Education  2018 [employer]

## 2017-04-23 NOTE — Progress Notes (Signed)
Subjective: Postpartum Day 2: Cesarean Delivery Patient reports tolerating PO.    Objective: Vital signs in last 24 hours: Temp:  [98.1 F (36.7 C)-98.4 F (36.9 C)] 98.4 F (36.9 C) (11/18 2000) Pulse Rate:  [78-84] 84 (11/18 2000) Resp:  [18] 18 (11/18 2000) BP: (130-141)/(48-81) 130/48 (11/18 2000) SpO2:  [99 %-100 %] 100 % (11/18 2000)  Physical Exam:  General: alert and no distress Lochia: appropriate Uterine Fundus: firm Incision: healing well DVT Evaluation: No evidence of DVT seen on physical exam.  Recent Labs    04/22/17 0429 04/23/17 0618  HGB 6.6* 6.8*  HCT 19.9* 20.9*    Assessment/Plan: Status post Cesarean section. Postoperative course complicated by anemia  Rooming in.Heather Noble.  Heather Noble V Heather Noble 04/23/2017, 6:49 AM Patient ID: Heather AlbeeNatalyia Noble, female   DOB: May 08, 1996, 21 y.o.   MRN: [id number]

## 2017-04-23 NOTE — Discharge Summary (Signed)
OB Discharge Summary     Patient Name: Heather Noble DOB: 27-Sep-1995 MRN: [id number]  Date of admission: 04/20/2017 Delivering MD: Jaynie CollinsANYANWU, UGONNA A   Date of discharge: 04/23/2017  Admitting diagnosis: 405wks CTX super close  gestational hypertension Intrauterine pregnancy: 688w1d     Secondary diagnosis:  Principal Problem:   S/P cesarean section Active Problems:   Gestational hypertension acute postsurgical anemia on top of chronic anemia Additional problems:       Discharge diagnosis: Term Pregnancy Delivered and Anemia                                                                                                Post partum procedures:  Augmentation: Pitocin  Complications: Hemorrhage>106500mL  Hospital course:  Onset of Labor With Unplanned C/S  21 y.o. yo G1P0000 at 3888w1d was admitted with Gestational HTN on 04/20/2017. Patient had a labor course significant for nonreasssuring FHT's's. Membrane Rupture Time/Date: 4:56 AM ,04/21/2017   The patient went for cesarean section due to Non-Reassuring FHR, and delivered a Viable infant,04/21/2017  Details of operation can be found in separate operative note. Patient had an uncomplicated postpartum course.  She is ambulating,tolerating a regular diet, passing flatus, and urinating well.  Patient is discharged home in stable condition 04/23/17.the baby is rooming in on bililites for jaundice so pt will be staying on rooming in.  Physical exam  Vitals:   04/22/17 0756 04/22/17 1603 04/22/17 2000 04/23/17 0850  BP: 130/81 (!) 141/56 (!) 130/48 137/79  Pulse: 78 82 84 82  Resp: 18 18 18 20   Temp: 98.1 F (36.7 C) 98.1 F (36.7 C) 98.4 F (36.9 C) 98.4 F (36.9 C)  TempSrc: Oral Oral Oral Oral  SpO2: 99% 100% 100% 99%  Weight:      Height:       General: alert, cooperative and no distress Lochia: appropriate Uterine Fundus: firm Incision: Healing well with no significant drainage DVT Evaluation: No evidence of DVT  seen on physical exam. Labs: Lab Results  Component Value Date   WBC 14.7 (H) 04/23/2017   HGB 6.8 (LL) 04/23/2017   HCT 20.9 (L) 04/23/2017   MCV 90.9 04/23/2017   PLT 272 04/23/2017   CMP Latest Ref Rng & Units 04/22/2017  Glucose 65 - 99 mg/dL -  BUN 6 - 20 mg/dL -  Creatinine 4.030.44 - 4.741.00 mg/dL 2.590.82  Sodium 563135 - 875145 mmol/L -  Potassium 3.5 - 5.1 mmol/L -  Chloride 101 - 111 mmol/L -  CO2 22 - 32 mmol/L -  Calcium 8.9 - 10.3 mg/dL -  Total Protein 6.5 - 8.1 g/dL -  Total Bilirubin 0.3 - 1.2 mg/dL -  Alkaline Phos 38 - 643126 U/L -  AST 15 - 41 U/L -  ALT 14 - 54 U/L -    Discharge instruction: per After Visit Summary and "Baby and Me Booklet".  After visit meds:  Percocet colace iron tabs  Diet: routine diet  Activity: Advance as tolerated. Pelvic rest for 6 weeks.   Outpatient follow up:6 weeks Follow up Appt: Future Appointments  Date Time  Provider Department Center  04/30/2017 11:00 AM [facility] NURSE [facility] None  05/21/2017  1:00 PM Constant, Peggy, MD [facility] None   Follow up Visit:No Follow-up on file.  Postpartum contraception: Undecided  Newborn Data: Live born female  Birth Weight: 7 lb 7.9 oz (3400 g) APGAR: 8, 9  Newborn Delivery   Birth date/time:  04/21/2017 07:49:00 Delivery type:  C-Section, Low Vertical C-section categorization:  Primary     Baby Feeding: Breast Disposition:rooming in   04/23/2017 Tilda BurrowJohn V Damika Harmon, MD

## 2017-04-23 NOTE — Lactation Note (Signed)
This note was copied from a baby's chart. Lactation Consultation Note  Patient Name: Heather Noble WUJWJ'XToday's Date: 04/23/2017 Reason for consult: Follow-up assessment;Hyperbilirubinemia   Follow up with mom of 50 hour old infant. Infant with 4 BF for 10-20 minutes, EBM x 2 of 10-15 cc, formula x 2 of 5-15 cc, 5 voids and 0 stools in the last 24 hours. Discussed with mom that infant needs to feed at the breast and follow with supplementation of EBM post BF.   Infant under phototherapy. Mom report infant does awaken to feed. Enc mom to offer breast with each feeding and follow with EBM via bottle. Advised mom not to allow infant to go longer than 3 hours between feeding.  Mom reports her breasts are fuller and she pumped 30 cc with the last pumping. Mom reports she is pumping about every 3 hours. Enc mom to continue to pump post BF. Mom to use formula if no breast milk available.   [facility] need [facility] at this time. Mom reports he last ate around 9:30 with the bottle. Mom is planning to pump in the next few minutes. Mom reports she has no questions/concerns at this time.    Maternal Data Formula Feeding for Exclusion: Yes Reason for exclusion: Mother's choice to formula and breast feed on admission Has patient been taught Hand Expression?: Yes Does the patient have breastfeeding experience prior to this delivery?: No  Feeding Feeding Type: Bottle Fed - Breast Milk  LATCH Score                   Interventions    Lactation Tools Discussed/Used Pump Review: Setup, frequency, and cleaning;Milk Storage Initiated by:: Reviewed and encouraged at least every 3 hours   Consult Status Consult Status: Follow-up Date: 04/24/17 Follow-up type: In-patient    Silas FloodSharon S Hice 04/23/2017, 10:47 AM

## 2017-04-23 NOTE — Progress Notes (Signed)
CRITICAL VALUE ALERT  Critical value received:  Hgb=6.8  Date of notification:  11/19  Time of notification: 0645  Critical value read back:Yes.    MD notified (1st page): Emelda FearFerguson  No new orders received, will see pt in rounds

## 2017-04-24 ENCOUNTER — Ambulatory Visit: Payer: Self-pay

## 2017-04-24 NOTE — Lactation Note (Signed)
This note was copied from a baby's chart. Lactation Consultation Note  Patient Name: Boy Jonita Albeeatalyia Hogen JXBJY'NToday's Date: 04/24/2017 Reason for consult: Follow-up assessment;Nipple pain/trauma;Difficult latch  Baby 79 hours old. Mom reports that she has been pumping and giving baby EBM and formula. Mom states that she wants to nurse, but her nipples have been very sore. Mom states that the last time that she attempted to nurse was last night--over 15 hours earlier. Assisted mom with positioning and placing rolled cloth diapers under her breast. Assisted mom to latch baby to right breast in football position and baby latched deeply and suckled rhythmically with intermittent swallows noted. Mom reported increased comfort with positioning. Mom's breast are starting to swell, so discussed engorgement prevention/treatment and enc nursing often. Enc mom to put baby to breast with cues, then supplement with EBM/formula and then post-pump. Enc mom to use EBM and coconut oil on nipples for healing and comfort. Mom aware of OP/BFSG and LC phone line assistance after D/C.   Maternal Data    Feeding Feeding Type: Breast Fed Nipple Type: Slow - flow  LATCH Score Latch: Grasps breast easily, tongue down, lips flanged, rhythmical sucking.  Audible Swallowing: Spontaneous and intermittent  Type of Nipple: Everted at rest and after stimulation  Comfort (Breast/Nipple): Filling, red/small blisters or bruises, mild/mod discomfort  Hold (Positioning): Assistance needed to correctly position infant at breast and maintain latch.  LATCH Score: 8  Interventions Interventions: Breast feeding basics reviewed;Assisted with latch;Hand express;Pre-pump if needed;Breast compression;Adjust position;Support pillows;Position options  Lactation Tools Discussed/Used     Consult Status Consult Status: PRN    Sherlyn HayJennifer D Shawnn Bouillon 04/24/2017, 3:32 PM

## 2017-04-28 ENCOUNTER — Inpatient Hospital Stay (HOSPITAL_COMMUNITY)
Admission: AD | Admit: 2017-04-28 | Discharge: 2017-04-28 | Disposition: A | Discharge status: Home / Self Care | Payer: Medicaid Other | Source: Ambulatory Visit | Attending: Obstetrics and Gynecology | Admitting: Obstetrics and Gynecology

## 2017-04-28 ENCOUNTER — Inpatient Hospital Stay (HOSPITAL_COMMUNITY): Payer: Medicaid Other

## 2017-04-28 ENCOUNTER — Encounter (HOSPITAL_COMMUNITY): Payer: Self-pay | Admitting: *Deleted

## 2017-04-28 DIAGNOSIS — Z9889 Other specified postprocedural states: Secondary | ICD-10-CM | POA: Insufficient documentation

## 2017-04-28 DIAGNOSIS — O169 Unspecified maternal hypertension, unspecified trimester: Secondary | ICD-10-CM | POA: Diagnosis not present

## 2017-04-28 DIAGNOSIS — O165 Unspecified maternal hypertension, complicating the puerperium: Secondary | ICD-10-CM | POA: Diagnosis not present

## 2017-04-28 DIAGNOSIS — R51 Headache: Secondary | ICD-10-CM | POA: Diagnosis present

## 2017-04-28 LAB — COMPREHENSIVE METABOLIC PANEL
ALT: 26 U/L (ref 14–54)
AST: 35 U/L (ref 15–41)
Albumin: 3 g/dL — ABNORMAL LOW (ref 3.5–5.0)
Alkaline Phosphatase: 113 U/L (ref 38–126)
Anion gap: 7 (ref 5–15)
BUN: 14 mg/dL (ref 6–20)
CO2: 26 mmol/L (ref 22–32)
Calcium: 8.8 mg/dL — ABNORMAL LOW (ref 8.9–10.3)
Chloride: 105 mmol/L (ref 101–111)
Creatinine, Ser: 1 mg/dL (ref 0.44–1.00)
Glucose, Bld: 87 mg/dL (ref 65–99)
POTASSIUM: 3.6 mmol/L (ref 3.5–5.1)
Sodium: 138 mmol/L (ref 135–145)
Total Bilirubin: 0.6 mg/dL (ref 0.3–1.2)
Total Protein: 7.4 g/dL (ref 6.5–8.1)

## 2017-04-28 LAB — URINALYSIS, ROUTINE W REFLEX MICROSCOPIC
BILIRUBIN URINE: NEGATIVE
Bacteria, UA: NONE SEEN
Glucose, UA: NEGATIVE mg/dL
HGB URINE DIPSTICK: NEGATIVE
Ketones, ur: NEGATIVE mg/dL
NITRITE: NEGATIVE
Protein, ur: NEGATIVE mg/dL
SPECIFIC GRAVITY, URINE: 1.016 (ref 1.005–1.030)
pH: 5 (ref 5.0–8.0)

## 2017-04-28 LAB — CBC
HCT: 22.7 % — ABNORMAL LOW (ref 36.0–46.0)
Hemoglobin: 7.3 g/dL — ABNORMAL LOW (ref 12.0–15.0)
MCH: 29.2 pg (ref 26.0–34.0)
MCHC: 32.2 g/dL (ref 30.0–36.0)
MCV: 90.8 fL (ref 78.0–100.0)
PLATELETS: 471 10*3/uL — AB (ref 150–400)
RBC: 2.5 MIL/uL — ABNORMAL LOW (ref 3.87–5.11)
RDW: 14 % (ref 11.5–15.5)
WBC: 9 10*3/uL (ref 4.0–10.5)

## 2017-04-28 LAB — PROTEIN / CREATININE RATIO, URINE
CREATININE, URINE: 203 mg/dL
PROTEIN CREATININE RATIO: 0.05 mg/mg{creat} (ref 0.00–0.15)
TOTAL PROTEIN, URINE: 10 mg/dL

## 2017-04-28 MED ORDER — LABETALOL HCL 100 MG PO TABS
200.0000 mg | ORAL_TABLET | Freq: Once | ORAL | Status: AC
  Administered 2017-04-28: 200 mg via ORAL
  Filled 2017-04-28: qty 2

## 2017-04-28 MED ORDER — BUTALBITAL-APAP-CAFFEINE 50-325-40 MG PO TABS
2.0000 | ORAL_TABLET | Freq: Once | ORAL | Status: AC
  Administered 2017-04-28: 2 via ORAL
  Filled 2017-04-28: qty 2

## 2017-04-28 MED ORDER — AMLODIPINE BESYLATE 10 MG PO TABS: 10.0000 mg | ORAL_TABLET | Freq: Every day | ORAL | 11 refills | Status: DC

## 2017-04-28 MED ORDER — AMLODIPINE BESYLATE 10 MG PO TABS
10.0000 mg | ORAL_TABLET | Freq: Once | ORAL | Status: AC
  Administered 2017-04-28: 10 mg via ORAL
  Filled 2017-04-28: qty 1

## 2017-04-28 NOTE — MAU Provider Note (Signed)
History   pt is s/p c/s on 04/21/17 in with c/o headache, blurred vision and epigastric pain. States took motrin for headache at 1600 with no relief. Pt was not discharged on any BP med.  CSN: [id number]  Arrival date & time 04/28/17  1857   None     Chief Complaint  Patient presents with  . Hypertension  . Abdominal Pain    HPI  Past Medical History:  Diagnosis Date  . Headache   . Pregnancy induced hypertension     Past Surgical History:  Procedure Laterality Date  . CESAREAN SECTION N/A 04/21/2017   Procedure: CESAREAN SECTION;  Surgeon: Tereso NewcomerAnyanwu, Ugonna A, MD;  Location: [facility];  Service: Obstetrics;  Laterality: N/A;  . NO PAST SURGERIES    . WISDOM TOOTH EXTRACTION  2015    Family History  Problem Relation Age of Onset  . Hypertension Maternal Grandmother   . Osteoarthritis Maternal Grandmother   . Gout Maternal Grandmother   . Thyroid disease Maternal Grandmother   . Diabetes Maternal Grandmother   . Cancer Paternal Grandfather        unknown    Social History   Tobacco Use  . Smoking status: Never Smoker  . Smokeless tobacco: Never Used  Substance Use Topics  . Alcohol use: No    Comment: occ  . Drug use: No    OB History    Gravida Para Term Preterm AB Living   1 1 1  0 0 1   SAB TAB Ectopic Multiple Live Births   0 0 0 0 1      Review of Systems  Constitutional: Negative.   HENT: Negative.   Eyes: Negative.   Respiratory: Negative.   Cardiovascular: Negative.   Gastrointestinal: Positive for abdominal pain.  Endocrine: Negative.   Genitourinary: Negative.   Musculoskeletal: Negative.   Skin: Negative.   Allergic/Immunologic: Negative.   Neurological: Positive for headaches.  Hematological: Negative.   Psychiatric/Behavioral: Negative.     Allergies  Patient has no known allergies.  Home Medications    BP (!) 149/104   Pulse 91   Temp 98.2 F (36.8 C)   Resp 18   Breastfeeding? Unknown   Physical Exam   Constitutional: She is oriented to person, place, and time. She appears well-developed and well-nourished.  HENT:  Head: Normocephalic.  Eyes: Pupils are equal, round, and reactive to light.  Neck: Normal range of motion.  Cardiovascular: Normal rate, regular rhythm, normal heart sounds and intact distal pulses.  Pulmonary/Chest: Effort normal and breath sounds normal.  Abdominal: Soft. Bowel sounds are normal.  Musculoskeletal: Normal range of motion.  Neurological: She is alert and oriented to person, place, and time. She has normal reflexes.  DTR's 2+ no clonus  Skin: Skin is warm and dry.  Psychiatric: She has a normal mood and affect. Her behavior is normal. Thought content normal.    MAU Course  Procedures (including critical care time)  Labs Reviewed  URINALYSIS, ROUTINE W REFLEX MICROSCOPIC  CBC  COMPREHENSIVE METABOLIC PANEL  PROTEIN / CREATININE RATIO, URINE   No results found.   1. Hypertension in pregnancy, delivered       MDM  BP elevated, alert and oriented x 3. PIH labs drawn. Labs all WNL, POC discussed with Dr. Emelda FearFerguson. To d/c pt home on Norvasc and f/u in clinic for BP check Wednesday.

## 2017-04-28 NOTE — Discharge Instructions (Signed)
Hypertension During Pregnancy °Hypertension, commonly called high blood pressure, is when the force of blood pumping through your arteries is too strong. Arteries are blood vessels that carry blood from the heart throughout the body. Hypertension during pregnancy can cause problems for you and your baby. Your baby may be born early (prematurely) or may not weigh as much as he or she should at birth. Very bad cases of hypertension during pregnancy can be life-threatening. °Different types of hypertension can occur during pregnancy. These include: °· Chronic hypertension. This happens when: °? You have hypertension before pregnancy and it continues during pregnancy. °? You develop hypertension before you are [redacted] weeks pregnant, and it continues during pregnancy. °· Gestational hypertension. This is hypertension that develops after the 20th week of pregnancy. °· Preeclampsia, also called toxemia of pregnancy. This is a very serious type of hypertension that develops only during pregnancy. It affects the whole body, and it can be very dangerous for you and your baby. ° °Gestational hypertension and preeclampsia usually go away within 6 weeks after your baby is born. Women who have hypertension during pregnancy have a greater chance of developing hypertension later in life or during future pregnancies. °What are the causes? °The exact cause of hypertension is not known. °What increases the risk? °There are certain factors that make it more likely for you to develop hypertension during pregnancy. These include: °· Having hypertension during a previous pregnancy or prior to pregnancy. °· Being overweight. °· Being older than age 40. °· Being pregnant for the first time or being pregnant with more than one baby. °· Becoming pregnant using fertilization methods such as IVF (in vitro fertilization). °· Having diabetes, kidney problems, or systemic lupus erythematosus. °· Having a family history of hypertension. ° °What are the  signs or symptoms? °Chronic hypertension and gestational hypertension rarely cause symptoms. Preeclampsia causes symptoms, which may include: °· Increased protein in your urine. Your health care provider will check for this at every visit before you give birth (prenatal visit). °· Severe headaches. °· Sudden weight gain. °· Swelling of the hands, face, legs, and feet. °· Nausea and vomiting. °· Vision problems, such as blurred or double vision. °· Numbness in the face, arms, legs, and feet. °· Dizziness. °· Slurred speech. °· Sensitivity to bright lights. °· Abdominal pain. °· Convulsions. ° °How is this diagnosed? °You may be diagnosed with hypertension during a routine prenatal exam. At each prenatal visit, you may: °· Have a urine test to check for high amounts of protein in your urine. °· Have your blood pressure checked. A blood pressure reading is recorded as two numbers, such as "120 over 80" (or 120/80). The first ("top") number is called the systolic pressure. It is a measure of the pressure in your arteries when your heart beats. The second ("bottom") number is called the diastolic pressure. It is a measure of the pressure in your arteries as your heart relaxes between beats. Blood pressure is measured in a unit called mm Hg. A normal blood pressure reading is: °? Systolic: below 120. °? Diastolic: below 80. ° °The type of hypertension that you are diagnosed with depends on your test results and when your symptoms developed. °· Chronic hypertension is usually diagnosed before 20 weeks of pregnancy. °· Gestational hypertension is usually diagnosed after 20 weeks of pregnancy. °· Hypertension with high amounts of protein in the urine is diagnosed as preeclampsia. °· Blood pressure measurements that stay above 160 systolic, or above 110 diastolic, are   signs of severe preeclampsia. ° °How is this treated? °Treatment for hypertension during pregnancy varies depending on the type of hypertension you have and how  serious it is. °· If you take medicines called ACE inhibitors to treat chronic hypertension, you may need to switch medicines. ACE inhibitors should not be taken during pregnancy. °· If you have gestational hypertension, you may need to take blood pressure medicine. °· If you are at risk for preeclampsia, your health care provider may recommend that you take a low-dose aspirin every day to prevent high blood pressure during your pregnancy. °· If you have severe preeclampsia, you may need to be hospitalized so you and your baby can be monitored closely. You may also need to take medicine (magnesium sulfate) to prevent seizures and to lower blood pressure. This medicine may be given as an injection or through an IV tube. °· In some cases, if your condition gets worse, you may need to deliver your baby early. ° °Follow these instructions at home: °Eating and drinking °· Drink enough fluid to keep your urine clear or pale yellow. °· Eat a healthy diet that is low in salt (sodium). Do not add salt to your food. Check food labels to see how much sodium a food or beverage contains. °Lifestyle °· Do not use any products that contain nicotine or tobacco, such as cigarettes and e-cigarettes. If you need help quitting, ask your health care provider. °· Do not use alcohol. °· Avoid caffeine. °· Avoid stress as much as possible. Rest and get plenty of sleep. °General instructions °· Take over-the-counter and prescription medicines only as told by your health care provider. °· While lying down, lie on your left side. This keeps pressure off your baby. °· While sitting or lying down, raise (elevate) your feet. Try putting some pillows under your lower legs. °· Exercise regularly. Ask your health care provider what kinds of exercise are best for you. °· Keep all prenatal and follow-up visits as told by your health care provider. This is important. °Contact a health care provider if: °· You have symptoms that your health care  provider told you may require more treatment or monitoring, such as: °? Fever. °? Vomiting. °? Headache. °Get help right away if: °· You have severe abdominal pain or vomiting that does not get better with treatment. °· You suddenly develop swelling in your hands, ankles, or face. °· You gain 4 lbs (1.8 kg) or more in 1 week. °· You develop vaginal bleeding, or you have blood in your urine. °· You do not feel your baby moving as much as usual. °· You have blurred or double vision. °· You have muscle twitching or sudden tightening (spasms). °· You have shortness of breath. °· Your lips or fingernails turn blue. °This information is not intended to replace advice given to you by your health care provider. Make sure you discuss any questions you have with your health care provider. °Document Released: 02/07/2011 Document Revised: 12/10/2015 Document Reviewed: 11/05/2015 °Elsevier Interactive Patient Education © 2018 [employer] ° °

## 2017-04-28 NOTE — MAU Note (Signed)
Pt reports her b/p was elevated at home 175/120. Also c/o pain under her right rib and h/a.Had pre eclamsia at end of pregnancy. Not on any B/P meds

## 2017-04-30 ENCOUNTER — Ambulatory Visit: Payer: Medicaid Other

## 2017-04-30 VITALS — BP 149/92 | HR 99

## 2017-04-30 DIAGNOSIS — Z0130 Encounter for examination of blood pressure without abnormal findings: Secondary | ICD-10-CM

## 2017-04-30 NOTE — Progress Notes (Signed)
Subjective:  Jonita Albeeatalyia Minter is a 21 y.o. female with hypertension. Current Outpatient Medications  Medication Sig Dispense Refill  . amLODipine (NORVASC) 10 MG tablet Take 1 tablet (10 mg total) by mouth daily. 30 tablet 11  . ferrous sulfate 325 (65 FE) MG EC tablet Take 1 tablet (325 mg total) 2 (two) times daily with a meal by mouth. 60 tablet 3  . Flaxseed, Linseed, (FLAX SEED OIL PO) Take 1 capsule every 30 (thirty) days by mouth.     . oxyCODONE-acetaminophen (PERCOCET/ROXICET) 5-325 MG tablet Take 1 tablet every 4 (four) hours as needed by mouth (pain scale 4-7). 30 tablet 0  . Prenat-FeAsp-Meth-FA-DHA w/o A (PRENATE PIXIE) 10-0.6-0.4-200 MG CAPS Take 1 tablet by mouth daily. 30 capsule 12  . Vitamin D, Ergocalciferol, (DRISDOL) 50000 units CAPS capsule Take 1 capsule (50,000 Units total) by mouth every 7 (seven) days. (Patient taking differently: Take 50,000 Units every 7 (seven) days by mouth. Thursday) 30 capsule 2   No current facility-administered medications for this visit.     Hypertension ROS: taking medications as instructed, no medication side effects noted, no TIA's, no chest pain on exertion, no dyspnea on exertion and no swelling of ankles.  New concerns: no.   Objective:  There were no vitals taken for this visit.  Appearance alert, well appearing, and in no distress. General exam BP noted to be well controlled today in office.    Assessment:   Hypertension elevated blood pressure.   Plan:  Current treatment plan is effective, no change in therapy..Marland Kitchen

## 2017-04-30 NOTE — Progress Notes (Signed)
Pt here for incision check and bp check. Pt has not taken her bp medication yet this am. Pt incision looks great. No drainage, odor, and appears to be closed up. Pt denies any complaints to the area.

## 2017-05-09 ENCOUNTER — Ambulatory Visit: Payer: Medicaid Other

## 2017-05-09 VITALS — BP 127/85 | HR 84 | Wt 204.0 lb

## 2017-05-09 DIAGNOSIS — Z0130 Encounter for examination of blood pressure without abnormal findings: Secondary | ICD-10-CM

## 2017-05-09 NOTE — Progress Notes (Signed)
..   Subjective:  Heather Noble is a 21 y.o. female with hypertension. Current Outpatient Medications  Medication Sig Dispense Refill  . ferrous sulfate 325 (65 FE) MG EC tablet Take 1 tablet (325 mg total) 2 (two) times daily with a meal by mouth. 60 tablet 3  . Prenat-FeAsp-Meth-FA-DHA w/o A (PRENATE PIXIE) 10-0.6-0.4-200 MG CAPS Take 1 tablet by mouth daily. 30 capsule 12  . amLODipine (NORVASC) 10 MG tablet Take 1 tablet (10 mg total) by mouth daily. (Patient not taking: Reported on 05/09/2017) 30 tablet 11  . Flaxseed, Linseed, (FLAX SEED OIL PO) Take 1 capsule every 30 (thirty) days by mouth.     . oxyCODONE-acetaminophen (PERCOCET/ROXICET) 5-325 MG tablet Take 1 tablet every 4 (four) hours as needed by mouth (pain scale 4-7). (Patient not taking: Reported on 05/09/2017) 30 tablet 0  . Vitamin D, Ergocalciferol, (DRISDOL) 50000 units CAPS capsule Take 1 capsule (50,000 Units total) by mouth every 7 (seven) days. (Patient not taking: Reported on 05/09/2017) 30 capsule 2   No current facility-administered medications for this visit.     Hypertension ROS: not taking medications regularly as instructed, no medication side effects noted, no TIA's, no chest pain on exertion, no dyspnea on exertion and no swelling of ankles.  New concerns: n/a.   Objective:  BP 127/85   Pulse 84   Wt 204 lb (92.5 kg)   Breastfeeding? Yes   BMI 33.95 kg/m   Appearance alert, well appearing, and in no distress. General exam BP noted to be well controlled today in office.    Assessment:   Hypertension well controlled.   Plan:  Current treatment plan is effective, no change in therapy. Follow up: 2 weeks and as needed..Marland Kitchen

## 2017-05-21 ENCOUNTER — Encounter: Payer: Self-pay | Admitting: Obstetrics and Gynecology

## 2017-05-21 ENCOUNTER — Ambulatory Visit (INDEPENDENT_AMBULATORY_CARE_PROVIDER_SITE_OTHER): Payer: Medicaid Other | Admitting: Obstetrics and Gynecology

## 2017-05-21 DIAGNOSIS — Z98891 History of uterine scar from previous surgery: Secondary | ICD-10-CM

## 2017-05-21 DIAGNOSIS — Z1389 Encounter for screening for other disorder: Secondary | ICD-10-CM

## 2017-05-21 MED ORDER — DOCUSATE SODIUM 100 MG PO CAPS: 100.0000 mg | ORAL_CAPSULE | Freq: Two times a day (BID) | ORAL | 2 refills | Status: DC

## 2017-05-21 NOTE — Progress Notes (Signed)
Post Partum Exam  Heather Noble is a 21 y.o. 521P1001 female who presents for a postpartum visit. She is 4 weeks postpartum following a low cervical transverse Cesarean section. I have fully reviewed the prenatal and intrapartum course. The delivery was at 6853w6d gestational weeks.  Anesthesia: epidural. Postpartum course has been unremarkable. Baby's course has been unremarkable. Baby is feeding by bottle Rush Barer- Gerber. Bleeding no bleeding. Bowel function is abnormal: constipation. Last BM this morning.. Bladder function is normal. Patient is not sexually active. Contraception method is none. Postpartum depression screening:neg     Review of Systems Pertinent items are noted in HPI.    Objective:  currently breastfeeding.  General:  alert, cooperative and no distress   Breasts:  inspection negative, no nipple discharge or bleeding, no masses or nodularity palpable  Lungs: clear to auscultation bilaterally  Heart:  regular rate and rhythm  Abdomen: soft, non-tender; bowel sounds normal; no masses,  no organomegaly  Incision: no erythema, induration or drainage. Healing well   Vulva:  normal  Vagina: normal vagina, no discharge, exudate, lesion, or erythema  Cervix:  multiparous appearance  Corpus: normal size, contour, position, consistency, mobility, non-tender  Adnexa:  no mass, fullness, tenderness  Rectal Exam: Not performed.        Assessment:    Normal postpartum exam. Pap smear not done at today's visit.   Plan:   1. Contraception: condoms 2. Patient is cleared to resume all activities of daily living. Patient to return when ready for contraception 3. Follow up in: 6 months for physical or as needed.

## 2017-05-21 NOTE — Patient Instructions (Signed)
Contraception Choices Contraception (birth control) is the use of any methods or devices to prevent pregnancy. Below are some methods to help avoid pregnancy. Hormonal methods  Contraceptive implant. This is a thin, plastic tube containing progesterone hormone. It does not contain estrogen hormone. Your health care provider inserts the tube in the inner part of the upper arm. The tube can remain in place for up to 3 years. After 3 years, the implant must be removed. The implant prevents the ovaries from releasing an egg (ovulation), thickens the cervical mucus to prevent sperm from entering the uterus, and thins the lining of the inside of the uterus.  Progesterone-only injections. These injections are given every 3 months by your health care provider to prevent pregnancy. This synthetic progesterone hormone stops the ovaries from releasing eggs. It also thickens cervical mucus and changes the uterine lining. This makes it harder for sperm to survive in the uterus.  Birth control pills. These pills contain estrogen and progesterone hormone. They work by preventing the ovaries from releasing eggs (ovulation). They also cause the cervical mucus to thicken, preventing the sperm from entering the uterus. Birth control pills are prescribed by a health care provider.Birth control pills can also be used to treat heavy periods.  Minipill. This type of birth control pill contains only the progesterone hormone. They are taken every day of each month and must be prescribed by your health care provider.  Birth control patch. The patch contains hormones similar to those in birth control pills. It must be changed once a week and is prescribed by a health care provider.  Vaginal ring. The ring contains hormones similar to those in birth control pills. It is left in the vagina for 3 weeks, removed for 1 week, and then a new one is put back in place. The patient must be comfortable inserting and removing the ring from  the vagina.A health care provider's prescription is necessary.  Emergency contraception. Emergency contraceptives prevent pregnancy after unprotected sexual intercourse. This pill can be taken right after sex or up to 5 days after unprotected sex. It is most effective the sooner you take the pills after having sexual intercourse. Most emergency contraceptive pills are available without a prescription. Check with your pharmacist. Do not use emergency contraception as your only form of birth control. Barrier methods  Female condom. This is a thin sheath (latex or rubber) that is worn over the penis during sexual intercourse. It can be used with spermicide to increase effectiveness.  Female condom. This is a soft, loose-fitting sheath that is put into the vagina before sexual intercourse.  Diaphragm. This is a soft, latex, dome-shaped barrier that must be fitted by a health care provider. It is inserted into the vagina, along with a spermicidal jelly. It is inserted before intercourse. The diaphragm should be left in the vagina for 6 to 8 hours after intercourse.  Cervical cap. This is a round, soft, latex or plastic cup that fits over the cervix and must be fitted by a health care provider. The cap can be left in place for up to 48 hours after intercourse.  Sponge. This is a soft, circular piece of polyurethane foam. The sponge has spermicide in it. It is inserted into the vagina after wetting it and before sexual intercourse.  Spermicides. These are chemicals that kill or block sperm from entering the cervix and uterus. They come in the form of creams, jellies, suppositories, foam, or tablets. They do not require a prescription. They   are inserted into the vagina with an applicator before having sexual intercourse. The process must be repeated every time you have sexual intercourse. Intrauterine contraception  Intrauterine device (IUD). This is a T-shaped device that is put in a woman's uterus during  a menstrual period to prevent pregnancy. There are 2 types: ? Copper IUD. This type of IUD is wrapped in copper wire and is placed inside the uterus. Copper makes the uterus and fallopian tubes produce a fluid that kills sperm. It can stay in place for 10 years. ? Hormone IUD. This type of IUD contains the hormone progestin (synthetic progesterone). The hormone thickens the cervical mucus and prevents sperm from entering the uterus, and it also thins the uterine lining to prevent implantation of a fertilized egg. The hormone can weaken or kill the sperm that get into the uterus. It can stay in place for 3-5 years, depending on which type of IUD is used. Permanent methods of contraception  Female tubal ligation. This is when the woman's fallopian tubes are surgically sealed, tied, or blocked to prevent the egg from traveling to the uterus.  Hysteroscopic sterilization. This involves placing a small coil or insert into each fallopian tube. Your doctor uses a technique called hysteroscopy to do the procedure. The device causes scar tissue to form. This results in permanent blockage of the fallopian tubes, so the sperm cannot fertilize the egg. It takes about 3 months after the procedure for the tubes to become blocked. You must use another form of birth control for these 3 months.  Female sterilization. This is when the female has the tubes that carry sperm tied off (vasectomy).This blocks sperm from entering the vagina during sexual intercourse. After the procedure, the man can still ejaculate fluid (semen). Natural planning methods  Natural family planning. This is not having sexual intercourse or using a barrier method (condom, diaphragm, cervical cap) on days the woman could become pregnant.  Calendar method. This is keeping track of the length of each menstrual cycle and identifying when you are fertile.  Ovulation method. This is avoiding sexual intercourse during ovulation.  Symptothermal method.  This is avoiding sexual intercourse during ovulation, using a thermometer and ovulation symptoms.  Post-ovulation method. This is timing sexual intercourse after you have ovulated. Regardless of which type or method of contraception you choose, it is important that you use condoms to protect against the transmission of sexually transmitted infections (STIs). Talk with your health care provider about which form of contraception is most appropriate for you. This information is not intended to replace advice given to you by your health care provider. Make sure you discuss any questions you have with your health care provider. Document Released: 05/22/2005 Document Revised: 10/28/2015 Document Reviewed: 11/14/2012 Elsevier Interactive Patient Education  2017 [employer]  

## 2017-07-05 ENCOUNTER — Emergency Department (HOSPITAL_COMMUNITY)
Admission: EM | Admit: 2017-07-05 | Discharge: 2017-07-05 | Disposition: A | Discharge status: Home / Self Care | Payer: Medicaid Other | Attending: Emergency Medicine | Admitting: Emergency Medicine

## 2017-07-05 ENCOUNTER — Encounter (HOSPITAL_COMMUNITY): Payer: Self-pay

## 2017-07-05 ENCOUNTER — Emergency Department (HOSPITAL_COMMUNITY): Payer: Medicaid Other

## 2017-07-05 ENCOUNTER — Other Ambulatory Visit: Payer: Self-pay

## 2017-07-05 DIAGNOSIS — Z79899 Other long term (current) drug therapy: Secondary | ICD-10-CM | POA: Insufficient documentation

## 2017-07-05 DIAGNOSIS — Z041 Encounter for examination and observation following transport accident: Secondary | ICD-10-CM | POA: Diagnosis not present

## 2017-07-05 MED ORDER — NAPROXEN 375 MG PO TABS: 375.0000 mg | ORAL_TABLET | Freq: Two times a day (BID) | ORAL | 0 refills | Status: DC

## 2017-07-05 MED ORDER — CYCLOBENZAPRINE HCL 10 MG PO TABS: 10.0000 mg | ORAL_TABLET | Freq: Two times a day (BID) | ORAL | 0 refills | Status: DC | PRN

## 2017-07-05 MED ORDER — IBUPROFEN 400 MG PO TABS
600.0000 mg | ORAL_TABLET | Freq: Once | ORAL | Status: AC
  Administered 2017-07-05: 19:00:00 600 mg via ORAL
  Filled 2017-07-05: qty 1
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
## 2017-07-05 NOTE — ED Notes (Signed)
Patient transported to X-ray 
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
## 2018-05-29 ENCOUNTER — Other Ambulatory Visit: Payer: Self-pay

## 2018-05-29 ENCOUNTER — Emergency Department (HOSPITAL_COMMUNITY)
Admission: EM | Admit: 2018-05-29 | Discharge: 2018-05-29 | Disposition: A | Discharge status: Home / Self Care | Payer: Medicaid Other | Attending: Emergency Medicine | Admitting: Emergency Medicine

## 2018-05-29 ENCOUNTER — Encounter (HOSPITAL_COMMUNITY): Payer: Self-pay | Admitting: Emergency Medicine

## 2018-05-29 DIAGNOSIS — K5900 Constipation, unspecified: Secondary | ICD-10-CM | POA: Diagnosis not present

## 2018-05-29 DIAGNOSIS — R1084 Generalized abdominal pain: Secondary | ICD-10-CM | POA: Diagnosis present

## 2018-05-29 DIAGNOSIS — Z79899 Other long term (current) drug therapy: Secondary | ICD-10-CM | POA: Insufficient documentation

## 2018-05-29 LAB — URINALYSIS, ROUTINE W REFLEX MICROSCOPIC
Bilirubin Urine: NEGATIVE
GLUCOSE, UA: NEGATIVE mg/dL
Hgb urine dipstick: NEGATIVE
Ketones, ur: NEGATIVE mg/dL
Nitrite: NEGATIVE
PH: 8 (ref 5.0–8.0)
PROTEIN: NEGATIVE mg/dL
SPECIFIC GRAVITY, URINE: 1.009 (ref 1.005–1.030)

## 2018-05-29 LAB — COMPREHENSIVE METABOLIC PANEL
ALT: 13 U/L (ref 0–44)
ANION GAP: 8 (ref 5–15)
AST: 20 U/L (ref 15–41)
Albumin: 3.7 g/dL (ref 3.5–5.0)
Alkaline Phosphatase: 54 U/L (ref 38–126)
BUN: 5 mg/dL — ABNORMAL LOW (ref 6–20)
CO2: 23 mmol/L (ref 22–32)
CREATININE: 0.8 mg/dL (ref 0.44–1.00)
Calcium: 9.1 mg/dL (ref 8.9–10.3)
Chloride: 107 mmol/L (ref 98–111)
Glucose, Bld: 101 mg/dL — ABNORMAL HIGH (ref 70–99)
Potassium: 4 mmol/L (ref 3.5–5.1)
SODIUM: 138 mmol/L (ref 135–145)
Total Bilirubin: 1 mg/dL (ref 0.3–1.2)
Total Protein: 7.6 g/dL (ref 6.5–8.1)

## 2018-05-29 LAB — CBC WITH DIFFERENTIAL/PLATELET
Abs Immature Granulocytes: 0.04 10*3/uL (ref 0.00–0.07)
BASOS ABS: 0 10*3/uL (ref 0.0–0.1)
BASOS PCT: 0 %
EOS ABS: 0.2 10*3/uL (ref 0.0–0.5)
EOS PCT: 2 %
HCT: 32.4 % — ABNORMAL LOW (ref 36.0–46.0)
Hemoglobin: 10.3 g/dL — ABNORMAL LOW (ref 12.0–15.0)
Immature Granulocytes: 1 %
Lymphocytes Relative: 24 %
Lymphs Abs: 2 10*3/uL (ref 0.7–4.0)
MCH: 28.5 pg (ref 26.0–34.0)
MCHC: 31.8 g/dL (ref 30.0–36.0)
MCV: 89.8 fL (ref 80.0–100.0)
MONO ABS: 0.9 10*3/uL (ref 0.1–1.0)
Monocytes Relative: 11 %
NRBC: 0 % (ref 0.0–0.2)
Neutro Abs: 5.3 10*3/uL (ref 1.7–7.7)
Neutrophils Relative %: 62 %
PLATELETS: 410 10*3/uL — AB (ref 150–400)
RBC: 3.61 MIL/uL — AB (ref 3.87–5.11)
RDW: 12.4 % (ref 11.5–15.5)
WBC: 8.3 10*3/uL (ref 4.0–10.5)

## 2018-05-29 LAB — I-STAT BETA HCG BLOOD, ED (MC, WL, AP ONLY): I-stat hCG, quantitative: <5 m[IU]/mL (ref <5)

## 2018-05-29 LAB — LIPASE, BLOOD: LIPASE: 17 U/L (ref 11–51)

## 2018-05-29 MED ORDER — DOCUSATE SODIUM 283 MG RE ENEM: 1.0000 | ENEMA | RECTAL | 0 refills | Status: DC | PRN

## 2018-05-29 MED ORDER — POLYETHYLENE GLYCOL 3350 17 G PO PACK: 17.0000 g | PACK | Freq: Every day | ORAL | 0 refills | Status: DC

## 2018-05-29 NOTE — ED Provider Notes (Signed)
[facility]   CSN: [id number] Arrival date & time: 05/29/18  [id number]     History   Chief Complaint Chief Complaint  Patient presents with  . Constipation    HPI Heather Noble is a 22 y.o. female.  The history is provided by the patient.  Abdominal Pain   This is a new problem. The current episode started more than 2 days ago. The problem occurs daily. The problem has not changed since onset.The pain is associated with an unknown (Possible constipation, possible relation to food) factor. The pain is located in the generalized abdominal region. The quality of the pain is aching. The pain is at a severity of 3/10. The pain is mild. Associated symptoms include constipation. Pertinent negatives include anorexia, fever, belching, diarrhea, flatus, hematochezia, melena, nausea, vomiting, dysuria, frequency, hematuria, headaches, arthralgias and myalgias. The symptoms are aggravated by eating. Nothing relieves the symptoms. Past workup does not include GI consult.    Past Medical History:  Diagnosis Date  . Headache   . Pregnancy induced hypertension     Patient Active Problem List   Diagnosis Date Noted  . Gestational hypertension 04/21/2017  . Sickle cell trait (HCC) 11/03/2016  . Low vitamin D level 11/03/2016  . Rubella non-immune status, antepartum 11/03/2016  . Anemia affecting pregnancy in third trimester 11/03/2016  . S/P cesarean section 10/16/2016  . Migraine with aura and without status migrainosus, not intractable 10/16/2014    Past Surgical History:  Procedure Laterality Date  . CESAREAN SECTION N/A 04/21/2017   Procedure: CESAREAN SECTION;  Surgeon: Tereso NewcomerAnyanwu, Ugonna A, MD;  Location: [facility];  Service: Obstetrics;  Laterality: N/A;  . NO PAST SURGERIES    . WISDOM TOOTH EXTRACTION  2015     OB History    Gravida  1   Para  1   Term  1   Preterm  0   AB  0   Living  1     SAB  0   TAB  0   Ectopic  0   Multiple  0   Live Births  1            Home Medications    Prior to Admission medications   Medication Sig Start Date End Date Taking? Authorizing Provider  ibuprofen (ADVIL,MOTRIN) 200 MG tablet Take 200 mg by mouth every 6 (six) hours as needed for headache.   Yes [provider]  amLODipine (NORVASC) 10 MG tablet Take 1 tablet (10 mg total) by mouth daily. Patient not taking: Reported on 05/09/2017 04/28/17   Montez MoritaLawson, Marie D, CNM  cyclobenzaprine (FLEXERIL) 10 MG tablet Take 1 tablet (10 mg total) by mouth 2 (two) times daily as needed for muscle spasms. Patient not taking: Reported on 05/29/2018 07/05/17   Felicie MornSmith, David, NP  docusate sodium (ENEMEEZ) 283 MG enema Place 1 enema (283 mg total) rectally as needed for up to 5 doses for severe constipation. 05/29/18   Lakya Schrupp, DO  ferrous sulfate 325 (65 FE) MG EC tablet Take 1 tablet (325 mg total) 2 (two) times daily with a meal by mouth. Patient not taking: Reported on 05/29/2018 04/23/17   Marthenia RollingBland, Scott, DO  naproxen (NAPROSYN) 375 MG tablet Take 1 tablet (375 mg total) by mouth 2 (two) times daily. Patient not taking: Reported on 05/29/2018 07/05/17   Felicie MornSmith, David, NP  oxyCODONE-acetaminophen (PERCOCET/ROXICET) 5-325 MG tablet Take 1 tablet every 4 (four) hours as needed by mouth (  pain scale 4-7). Patient not taking: Reported on 05/09/2017 04/23/17   Tilda Burrow, MD  polyethylene glycol [facility] Ethelene Hal) packet Take 17 g by mouth daily. 05/29/18   Sussie Minor, DO  Prenat-FeAsp-Meth-FA-DHA w/o A (PRENATE PIXIE) 10-0.6-0.4-200 MG CAPS Take 1 tablet by mouth daily. Patient not taking: Reported on 05/29/2018 10/16/16   Orvilla Cornwall A, CNM  Vitamin D, Ergocalciferol, (DRISDOL) 50000 units CAPS capsule Take 1 capsule (50,000 Units total) by mouth every 7 (seven) days. Patient not taking: Reported on 05/09/2017 03/29/17   Roe Coombs, CNM    Family History Family History    Problem Relation Age of Onset  . Hypertension Maternal Grandmother   . Osteoarthritis Maternal Grandmother   . Gout Maternal Grandmother   . Thyroid disease Maternal Grandmother   . Diabetes Maternal Grandmother   . Cancer Paternal Grandfather        unknown    Social History Social History   Tobacco Use  . Smoking status: Never Smoker  . Smokeless tobacco: Never Used  Substance Use Topics  . Alcohol use: No    Comment: occ  . Drug use: No     Allergies   Patient has no known allergies.   Review of Systems Review of Systems  Constitutional: Negative for chills and fever.  HENT: Negative for ear pain and sore throat.   Eyes: Negative for pain and visual disturbance.  Respiratory: Negative for cough and shortness of breath.   Cardiovascular: Negative for chest pain and palpitations.  Gastrointestinal: Positive for abdominal pain and constipation. Negative for anorexia, diarrhea, flatus, hematochezia, melena, nausea and vomiting.  Genitourinary: Negative for dysuria, frequency and hematuria.  Musculoskeletal: Negative for arthralgias, back pain and myalgias.  Skin: Negative for color change and rash.  Neurological: Negative for seizures, syncope and headaches.  All other systems reviewed and are negative.    Physical Exam Updated Vital Signs  ED Triage Vitals [05/29/18 0843]  Enc Vitals Group     BP (!) 153/98     Pulse Rate 93     Resp 15     Temp 98.4 F (36.9 C)     Temp Source Oral     SpO2 100 %     Weight      Height 5\' 5"  (1.651 m)     Head Circumference      Peak Flow      Pain Score 6     Pain Loc      Pain Edu?      Excl. in GC?     Physical Exam Vitals signs and nursing note reviewed.  Constitutional:      General: She is not in acute distress.    Appearance: She is well-developed.  HENT:     Head: Normocephalic and atraumatic.     Nose: Nose normal.     Mouth/Throat:     Mouth: Mucous membranes are moist.  Eyes:      Conjunctiva/sclera: Conjunctivae normal.     Pupils: Pupils are equal, round, and reactive to light.  Neck:     Musculoskeletal: Normal range of motion and neck supple.  Cardiovascular:     Rate and Rhythm: Normal rate and regular rhythm.     Pulses: Normal pulses.     Heart sounds: Normal heart sounds. No murmur.  Pulmonary:     Effort: Pulmonary effort is normal. No respiratory distress.     Breath sounds: Normal breath sounds.  Abdominal:  General: There is distension (mild).     Palpations: Abdomen is soft. There is no mass.     Tenderness: There is abdominal tenderness (mild). There is no guarding.     Hernia: No hernia is present.  Skin:    General: Skin is warm and dry.     Capillary Refill: Capillary refill takes less than 2 seconds.  Neurological:     General: No focal deficit present.     Mental Status: She is alert.  Psychiatric:        Mood and Affect: Mood normal.      ED Treatments / Results  Labs (all labs ordered are listed, but only abnormal results are displayed) Labs Reviewed  COMPREHENSIVE METABOLIC PANEL - Abnormal; Notable for the following components:      Result Value   Glucose, Bld 101 (*)    BUN 5 (*)    All other components within normal limits  CBC WITH DIFFERENTIAL/PLATELET - Abnormal; Notable for the following components:   RBC 3.61 (*)    Hemoglobin 10.3 (*)    HCT 32.4 (*)    Platelets 410 (*)    All other components within normal limits  LIPASE, BLOOD  URINALYSIS, ROUTINE W REFLEX MICROSCOPIC  I-STAT BETA HCG BLOOD, ED ([facility], [facility], [facility] ONLY)    EKG None  Radiology No results found.  Procedures Procedures (including critical care time)  Medications Ordered in ED Medications - No data to display   Initial Impression / Assessment and Plan / ED Course  I have reviewed the triage vital signs and the nursing notes.  Pertinent labs & imaging results that were available during my care of the patient were reviewed by me and  considered in my medical decision making (see chart for details).     Heather Noble is a 22 year old female with history of prior C-section who presents to the ED with abdominal pain.  Patient with normal vitals.  No fever.  Patient with symptoms on and off for the last several days.  She states that she has not had a good bowel movement for the last several days.  Her stool has been pellet-like.  She has been using detox tea with some relief.  Patient feels bloated.  Has generalized abdominal tenderness.  She is mildly tender throughout on exam.  No focal tenderness.  No signs of peritonitis.  Denies any urinary symptoms.  Recently had her menstrual cycle 3 weeks ago.  Patient with no nausea, no vomiting.  No chest pain, no shortness of breath.  We will get basic labs.  Urinalysis.  Suspect likely constipation versus gallbladder or liver etiology.  Less likely gastritis, reflux.  Patient with no significant anemia, electrolyte abnormality, kidney injury.  Liver and gallbladder enzymes within normal limits.  Lipase within normal limits.  No urinary symptoms, U/a wnl.  Pregnancy test negative.  Patient with no fever, no significant leukocytosis and overall reassuring abdominal exam.  No need for any imaging at this time given lab work and history and physical.  Given prescription for MiraLAX/enema.  Recommend close follow-up with primary care doctor.  Told to return to the ED if symptoms worsen.  This chart was dictated using voice recognition software.  Despite best efforts to proofread,  errors can occur which can change the documentation meaning.   Final Clinical Impressions(s) / ED Diagnoses   Final diagnoses:  Constipation, unspecified constipation type    ED Discharge Orders         Ordered  polyethylene glycol (MIRALAX / GLYCOLAX) packet  Daily     05/29/18 1016    docusate sodium (ENEMEEZ) 283 MG enema  As needed     05/29/18 1016           Alonso Gapinski, DO 05/29/18  1016

## 2018-05-29 NOTE — ED Triage Notes (Signed)
Patient c/o lower abdominal pain and constipation. Patient appears bloated and tender to epigastric area. Reports drinking a detox tea Saturday or Sunday. Unsure of last BM.

## 2019-02-25 ENCOUNTER — Emergency Department (HOSPITAL_COMMUNITY)
Admission: EM | Admit: 2019-02-25 | Discharge: 2019-02-25 | Disposition: A | Discharge status: Home / Self Care | Payer: Medicaid Other | Attending: Emergency Medicine | Admitting: Emergency Medicine

## 2019-02-25 ENCOUNTER — Emergency Department (HOSPITAL_COMMUNITY): Payer: Medicaid Other

## 2019-02-25 ENCOUNTER — Other Ambulatory Visit: Payer: Self-pay

## 2019-02-25 ENCOUNTER — Encounter (HOSPITAL_COMMUNITY): Payer: Self-pay | Admitting: Emergency Medicine

## 2019-02-25 DIAGNOSIS — E860 Dehydration: Secondary | ICD-10-CM | POA: Insufficient documentation

## 2019-02-25 DIAGNOSIS — Z20828 Contact with and (suspected) exposure to other viral communicable diseases: Secondary | ICD-10-CM | POA: Diagnosis not present

## 2019-02-25 DIAGNOSIS — R531 Weakness: Secondary | ICD-10-CM | POA: Diagnosis present

## 2019-02-25 LAB — CBC WITH DIFFERENTIAL/PLATELET
Abs Immature Granulocytes: 0.02 10*3/uL (ref 0.00–0.07)
Basophils Absolute: 0 10*3/uL (ref 0.0–0.1)
Basophils Relative: 0 %
Eosinophils Absolute: 0 10*3/uL (ref 0.0–0.5)
Eosinophils Relative: 0 %
HCT: 33.9 % — ABNORMAL LOW (ref 36.0–46.0)
Hemoglobin: 10.5 g/dL — ABNORMAL LOW (ref 12.0–15.0)
Immature Granulocytes: 0 %
Lymphocytes Relative: 35 %
Lymphs Abs: 2.6 10*3/uL (ref 0.7–4.0)
MCH: 28.1 pg (ref 26.0–34.0)
MCHC: 31 g/dL (ref 30.0–36.0)
MCV: 90.6 fL (ref 80.0–100.0)
Monocytes Absolute: 0.6 10*3/uL (ref 0.1–1.0)
Monocytes Relative: 8 %
Neutro Abs: 4.2 10*3/uL (ref 1.7–7.7)
Neutrophils Relative %: 57 %
Platelets: 373 10*3/uL (ref 150–400)
RBC: 3.74 MIL/uL — ABNORMAL LOW (ref 3.87–5.11)
RDW: 12.7 % (ref 11.5–15.5)
WBC: 7.5 10*3/uL (ref 4.0–10.5)
nRBC: 0 % (ref 0.0–0.2)

## 2019-02-25 LAB — LACTIC ACID, PLASMA: Lactic Acid, Venous: 1.1 mmol/L (ref 0.5–1.9)

## 2019-02-25 LAB — COMPREHENSIVE METABOLIC PANEL
ALT: 19 U/L (ref 0–44)
AST: 20 U/L (ref 15–41)
Albumin: 4 g/dL (ref 3.5–5.0)
Alkaline Phosphatase: 52 U/L (ref 38–126)
Anion gap: 7 (ref 5–15)
BUN: 9 mg/dL (ref 6–20)
CO2: 25 mmol/L (ref 22–32)
Calcium: 9.2 mg/dL (ref 8.9–10.3)
Chloride: 105 mmol/L (ref 98–111)
Creatinine, Ser: 0.8 mg/dL (ref 0.44–1.00)
GFR calc Af Amer: >60 mL/min (ref >60)
GFR calc non Af Amer: >60 mL/min (ref >60)
Glucose, Bld: 98 mg/dL (ref 70–99)
Potassium: 4.1 mmol/L (ref 3.5–5.1)
Sodium: 137 mmol/L (ref 135–145)
Total Bilirubin: 0.5 mg/dL (ref 0.3–1.2)
Total Protein: 7.9 g/dL (ref 6.5–8.1)

## 2019-02-25 LAB — URINALYSIS, ROUTINE W REFLEX MICROSCOPIC
Bilirubin Urine: NEGATIVE
Glucose, UA: NEGATIVE mg/dL
Ketones, ur: NEGATIVE mg/dL
Nitrite: NEGATIVE
Protein, ur: NEGATIVE mg/dL
Specific Gravity, Urine: 1.001 — ABNORMAL LOW (ref 1.005–1.030)
pH: 8 (ref 5.0–8.0)

## 2019-02-25 LAB — SARS CORONAVIRUS 2 (TAT 6-24 HRS): SARS Coronavirus 2: NEGATIVE

## 2019-02-25 LAB — HCG, QUANTITATIVE, PREGNANCY: hCG, Beta Chain, Quant, S: <1 m[IU]/mL (ref <5)

## 2019-02-25 MED ORDER — SODIUM CHLORIDE 0.9 % IV BOLUS
1000.0000 mL | Freq: Once | INTRAVENOUS | Status: AC
  Administered 2019-02-25: 1000 mL via INTRAVENOUS

## 2019-02-25 NOTE — ED Provider Notes (Signed)
[facility]-EMERGENCY DEPT Provider Note   CSN: [id number] Arrival date & time: 02/25/19  0601     History   Chief Complaint Chief Complaint  Patient presents with  . Dry Mouth    HPI Heather Noble is a 23 y.o. female.     Pt presents to the ED today with weakness, shakiness, and dry mouth.  She took zyrtec after these sx and is feeling better now.  She is worried she had a seizure, but did not have a loc or bladder incontinence.  The pt did say she was + for covid about 2 weeks ago.  She denies any f/c or cough.     Past Medical History:  Diagnosis Date  . Headache   . Pregnancy induced hypertension     Patient Active Problem List   Diagnosis Date Noted  . Gestational hypertension 04/21/2017  . Sickle cell trait (HCC) 11/03/2016  . Low vitamin D level 11/03/2016  . Rubella non-immune status, antepartum 11/03/2016  . Anemia affecting pregnancy in third trimester 11/03/2016  . S/P cesarean section 10/16/2016  . Migraine with aura and without status migrainosus, not intractable 10/16/2014    Past Surgical History:  Procedure Laterality Date  . CESAREAN SECTION N/A 04/21/2017   Procedure: CESAREAN SECTION;  Surgeon: Tereso Newcomer, MD;  Location: [facility];  Service: Obstetrics;  Laterality: N/A;  . NO PAST SURGERIES    . WISDOM TOOTH EXTRACTION  2015     OB History    Gravida  1   Para  1   Term  1   Preterm  0   AB  0   Living  1     SAB  0   TAB  0   Ectopic  0   Multiple  0   Live Births  1            Home Medications    Prior to Admission medications   Medication Sig Start Date End Date Taking? Authorizing Provider  ibuprofen (ADVIL,MOTRIN) 200 MG tablet Take 200 mg by mouth every 6 (six) hours as needed for headache.   Yes [provider]  Multiple Vitamin (MULTIVITAMIN WITH MINERALS) TABS tablet Take 1 tablet by mouth daily.   Yes [provider]  naproxen sodium (ALEVE) 220  MG tablet Take 220-440 mg by mouth 2 (two) times daily as needed (headache/pain).   Yes [provider]  amLODipine (NORVASC) 10 MG tablet Take 1 tablet (10 mg total) by mouth daily. Patient not taking: Reported on 05/09/2017 04/28/17   Montez Morita, CNM  cyclobenzaprine (FLEXERIL) 10 MG tablet Take 1 tablet (10 mg total) by mouth 2 (two) times daily as needed for muscle spasms. Patient not taking: Reported on 05/29/2018 07/05/17   Felicie Morn, NP  docusate sodium (ENEMEEZ) 283 MG enema Place 1 enema (283 mg total) rectally as needed for up to 5 doses for severe constipation. Patient not taking: Reported on 02/25/2019 05/29/18   Virgina Norfolk, DO  ferrous sulfate 325 (65 FE) MG EC tablet Take 1 tablet (325 mg total) 2 (two) times daily with a meal by mouth. Patient not taking: Reported on 05/29/2018 04/23/17   Marthenia Rolling, DO  naproxen (NAPROSYN) 375 MG tablet Take 1 tablet (375 mg total) by mouth 2 (two) times daily. Patient not taking: Reported on 05/29/2018 07/05/17   Felicie Morn, NP  oxyCODONE-acetaminophen (PERCOCET/ROXICET) 5-325 MG tablet Take 1 tablet every 4 (four) hours as needed by mouth (  pain scale 4-7). Patient not taking: Reported on 05/09/2017 04/23/17   Tilda Burrow, MD  polyethylene glycol [facility]) packet Take 17 g by mouth daily. Patient not taking: Reported on 02/25/2019 05/29/18   Virgina Norfolk, DO  Prenat-FeAsp-Meth-FA-DHA w/o A (PRENATE PIXIE) 10-0.6-0.4-200 MG CAPS Take 1 tablet by mouth daily. Patient not taking: Reported on 05/29/2018 10/16/16   Orvilla Cornwall A, CNM  Vitamin D, Ergocalciferol, (DRISDOL) 50000 units CAPS capsule Take 1 capsule (50,000 Units total) by mouth every 7 (seven) days. Patient not taking: Reported on 05/09/2017 03/29/17   Roe Coombs, CNM    Family History Family History  Problem Relation Age of Onset  . Hypertension Maternal Grandmother   . Osteoarthritis Maternal Grandmother   . Gout Maternal Grandmother    . Thyroid disease Maternal Grandmother   . Diabetes Maternal Grandmother   . Cancer Paternal Grandfather        unknown    Social History Social History   Tobacco Use  . Smoking status: Never Smoker  . Smokeless tobacco: Never Used  Substance Use Topics  . Alcohol use: No    Comment: occ  . Drug use: No     Allergies   Patient has no known allergies.   Review of Systems Review of Systems  Constitutional: Positive for fatigue.  Neurological: Positive for weakness.  All other systems reviewed and are negative.    Physical Exam Updated Vital Signs BP 118/77   Pulse 66   Temp 98.9 F (37.2 C) (Oral)   Resp 16   LMP 02/20/2019   SpO2 100%   Physical Exam Vitals signs and nursing note reviewed.  Constitutional:      Appearance: Normal appearance.  HENT:     Head: Normocephalic and atraumatic.     Right Ear: External ear normal.     Left Ear: External ear normal.     Nose: Nose normal.     Mouth/Throat:     Mouth: Mucous membranes are moist.     Pharynx: Oropharynx is clear.  Eyes:     Extraocular Movements: Extraocular movements intact.     Conjunctiva/sclera: Conjunctivae normal.     Pupils: Pupils are equal, round, and reactive to light.  Neck:     Musculoskeletal: Normal range of motion and neck supple.  Cardiovascular:     Rate and Rhythm: Normal rate and regular rhythm.     Pulses: Normal pulses.     Heart sounds: Normal heart sounds.  Pulmonary:     Effort: Pulmonary effort is normal.     Breath sounds: Normal breath sounds.  Abdominal:     General: Abdomen is flat. Bowel sounds are normal.     Palpations: Abdomen is soft.  Musculoskeletal: Normal range of motion.  Skin:    General: Skin is warm.     Capillary Refill: Capillary refill takes less than 2 seconds.  Neurological:     General: No focal deficit present.     Mental Status: She is alert and oriented to person, place, and time.  Psychiatric:        Mood and Affect: Mood normal.         Behavior: Behavior normal.        Thought Content: Thought content normal.        Judgment: Judgment normal.      ED Treatments / Results  Labs (all labs ordered are listed, but only abnormal results are displayed) Labs Reviewed  CBC WITH DIFFERENTIAL/PLATELET -  Abnormal; Notable for the following components:      Result Value   RBC 3.74 (*)    Hemoglobin 10.5 (*)    HCT 33.9 (*)    All other components within normal limits  URINALYSIS, ROUTINE W REFLEX MICROSCOPIC - Abnormal; Notable for the following components:   Color, Urine STRAW (*)    Specific Gravity, Urine 1.001 (*)    Hgb urine dipstick SMALL (*)    Leukocytes,Ua MODERATE (*)    Bacteria, UA RARE (*)    All other components within normal limits  SARS CORONAVIRUS 2 (TAT 6-24 HRS)  COMPREHENSIVE METABOLIC PANEL  LACTIC ACID, PLASMA  HCG, QUANTITATIVE, PREGNANCY    EKG EKG Interpretation  Date/Time:  Tuesday February 25 2019 09:36:03 EDT Ventricular Rate:  66 PR Interval:    QRS Duration: 77 QT Interval:  386 QTC Calculation: 405 R Axis:   42 Text Interpretation:  Sinus rhythm Borderline T wave abnormalities No significant change since last tracing Confirmed by Isla Pence [id number]) on 02/25/2019 10:17:04 AM   Radiology Dg Chest Portable 1 View  Result Date: 02/25/2019 CLINICAL DATA:  Weakness and dry mouth. EXAM: PORTABLE CHEST 1 VIEW COMPARISON:  Chest x-ray dated February 09, 2015. FINDINGS: The heart size and mediastinal contours are within normal limits. Both lungs are clear. The visualized skeletal structures are unremarkable. IMPRESSION: No active disease. Electronically Signed   By: Titus Dubin M.D.   On: 02/25/2019 10:17    Procedures Procedures (including critical care time)  Medications Ordered in ED Medications  sodium chloride 0.9 % bolus 1,000 mL (0 mLs Intravenous Stopped 02/25/19 1220)     Initial Impression / Assessment and Plan / ED Course  I have reviewed the triage vital  signs and the nursing notes.  Pertinent labs & imaging results that were available during my care of the patient were reviewed by me and considered in my medical decision making (see chart for details).       Pt is feeling much better.  She has no lab abnormalities.  I rechecked a covid.  Pt is stable for d/c.  Return if worse.  Final Clinical Impressions(s) / ED Diagnoses   Final diagnoses:  Dehydration    ED Discharge Orders    None       Isla Pence, MD 02/25/19 1339

## 2019-02-25 NOTE — ED Notes (Signed)
Pt informed that her mother called and she would like a call back.

## 2019-02-25 NOTE — ED Triage Notes (Signed)
Patient here from home reporting dry mouth. States that she took 2 Zyrtec tab.

## 2020-06-05 NOTE — L&D Delivery Note (Signed)
Delivery Note After a 10 minute 2nd stage, at 12:17 PM a viable female was delivered via VBAC, Spontaneous (Presentation: Left Occiput Anterior).  APGAR: 6, 8; weight pending.  After 1 minute, the cord was clamped and cut. 40 units of pitocin diluted in 1000cc LR was infused rapidly IV.  The placenta separated spontaneously and delivered via CCT and maternal pushing effort.  It was inspected and appears to be intact with a 3 VC. She had continued bleeding, so bladder emptied, TXA and cytotec given.  Anesthesia: local Episiotomy: None Lacerations: 2nd degree Suture Repair: 2.0 vicryl Est. Blood Loss (mL): 650  Mom to postpartum.  Baby to Couplet care / Skin to Skin.  Scarlette Calico Cresenzo-Dishmon 04/28/2021, 1:26 PM

## 2020-09-08 ENCOUNTER — Other Ambulatory Visit: Payer: Self-pay

## 2020-09-08 ENCOUNTER — Ambulatory Visit (INDEPENDENT_AMBULATORY_CARE_PROVIDER_SITE_OTHER): Payer: Medicaid Other

## 2020-09-08 DIAGNOSIS — Z3200 Encounter for pregnancy test, result unknown: Secondary | ICD-10-CM

## 2020-09-08 DIAGNOSIS — Z3201 Encounter for pregnancy test, result positive: Secondary | ICD-10-CM | POA: Diagnosis not present

## 2020-09-08 LAB — POCT URINE PREGNANCY: Preg Test, Ur: POSITIVE — AB

## 2020-09-08 MED ORDER — CVS PRENATAL GUMMY 0.4-25 MG PO CHEW: 3.0000 | CHEWABLE_TABLET | Freq: Every day | ORAL | 12 refills | Status: DC

## 2020-09-08 NOTE — Progress Notes (Signed)
Agree with A & P. 

## 2020-09-08 NOTE — Progress Notes (Signed)
..   Heather Noble presents today for UPT. She has no unusual complaints. LMP:07-21-20    OBJECTIVE: Appears well, in no apparent distress.  OB History    Gravida  2   Para  1   Term  1   Preterm  0   AB  0   Living  1     SAB  0   IAB  0   Ectopic  0   Multiple  0   Live Births  1          Home UPT Result:Positive In-Office UPT result:Positive I have reviewed the patient's medical, obstetrical, social, and family histories, and medications.   ASSESSMENT: Positive pregnancy test  PLAN Prenatal care to be completed at: [facility]

## 2020-09-29 ENCOUNTER — Ambulatory Visit (INDEPENDENT_AMBULATORY_CARE_PROVIDER_SITE_OTHER): Payer: Medicaid Other

## 2020-09-29 ENCOUNTER — Other Ambulatory Visit: Payer: Self-pay

## 2020-09-29 VITALS — BP 123/87 | HR 80 | Ht 65.0 in | Wt 202.1 lb

## 2020-09-29 DIAGNOSIS — O3680X0 Pregnancy with inconclusive fetal viability, not applicable or unspecified: Secondary | ICD-10-CM

## 2020-09-29 DIAGNOSIS — Z3A09 9 weeks gestation of pregnancy: Secondary | ICD-10-CM | POA: Diagnosis not present

## 2020-09-29 DIAGNOSIS — Z3491 Encounter for supervision of normal pregnancy, unspecified, first trimester: Secondary | ICD-10-CM | POA: Insufficient documentation

## 2020-09-29 DIAGNOSIS — Z3481 Encounter for supervision of other normal pregnancy, first trimester: Secondary | ICD-10-CM | POA: Diagnosis not present

## 2020-09-29 DIAGNOSIS — Z3490 Encounter for supervision of normal pregnancy, unspecified, unspecified trimester: Secondary | ICD-10-CM | POA: Insufficient documentation

## 2020-09-29 DIAGNOSIS — Z789 Other specified health status: Secondary | ICD-10-CM

## 2020-09-29 MED ORDER — BLOOD PRESSURE KIT DEVI: 1.0000 | 0 refills | Status: DC

## 2020-09-29 NOTE — Progress Notes (Signed)
Agree with A & P. 

## 2020-09-29 NOTE — Progress Notes (Signed)
PRENATAL INTAKE SUMMARY  Ms. Sonn presents today New OB Nurse Interview.  OB History    Gravida  2   Para  1   Term  1   Preterm  0   AB  0   Living  1     SAB  0   IAB  0   Ectopic  0   Multiple  0   Live Births  1          I have reviewed the patient's medical, obstetrical, social, and family histories, medications, and available lab results.  SUBJECTIVE She has no unusual complaints  OBJECTIVE Initial Physical Exam (New OB)  GENERAL APPEARANCE: alert, well appearing   ASSESSMENT Normal pregnancy  PLAN Prenatal care to be completed at [facility] labs to be completed at [employer] Ordered Blood Pressure kit ordered U/S performed today reveals single live IUP. FHR 157. Unable to obtain accurate measurements due to inability to get fetus into view. Patient suffering with constipation and not able to tolerate exam. Provided patient education to help relieve constipation. Patient will return next week for New OB and dating scan. PHQ 9 score: 3 GAD 7 score: 3

## 2020-10-06 ENCOUNTER — Ambulatory Visit (INDEPENDENT_AMBULATORY_CARE_PROVIDER_SITE_OTHER): Payer: Medicaid Other | Admitting: Women's Health

## 2020-10-06 ENCOUNTER — Encounter: Payer: Self-pay | Admitting: Women's Health

## 2020-10-06 ENCOUNTER — Other Ambulatory Visit (HOSPITAL_COMMUNITY)
Admission: RE | Admit: 2020-10-06 | Discharge: 2020-10-06 | Disposition: A | Discharge status: Home / Self Care | Payer: Medicaid Other | Source: Ambulatory Visit | Attending: Women's Health | Admitting: Women's Health

## 2020-10-06 ENCOUNTER — Other Ambulatory Visit: Payer: Self-pay | Admitting: Women's Health

## 2020-10-06 ENCOUNTER — Other Ambulatory Visit: Payer: Self-pay

## 2020-10-06 ENCOUNTER — Ambulatory Visit (INDEPENDENT_AMBULATORY_CARE_PROVIDER_SITE_OTHER): Payer: Medicaid Other

## 2020-10-06 VITALS — BP 127/85 | HR 89 | Wt 199.9 lb

## 2020-10-06 DIAGNOSIS — Z3A09 9 weeks gestation of pregnancy: Secondary | ICD-10-CM

## 2020-10-06 DIAGNOSIS — O99011 Anemia complicating pregnancy, first trimester: Secondary | ICD-10-CM

## 2020-10-06 DIAGNOSIS — O99611 Diseases of the digestive system complicating pregnancy, first trimester: Secondary | ICD-10-CM

## 2020-10-06 DIAGNOSIS — Z3481 Encounter for supervision of other normal pregnancy, first trimester: Secondary | ICD-10-CM

## 2020-10-06 DIAGNOSIS — O3680X0 Pregnancy with inconclusive fetal viability, not applicable or unspecified: Secondary | ICD-10-CM | POA: Diagnosis not present

## 2020-10-06 DIAGNOSIS — K5900 Constipation, unspecified: Secondary | ICD-10-CM

## 2020-10-06 LAB — HEPATITIS C ANTIBODY: HCV Ab: NEGATIVE

## 2020-10-06 NOTE — Patient Instructions (Signed)
Maternity Assessment Unit (MAU)  The Maternity Assessment Unit (MAU) is located at the [facility] at [facility]. The address is: [address] [place], [place], [place] [address]. Please see map below for additional directions.    The Maternity Assessment Unit is designed to help you during your pregnancy, and for up to 6 weeks after delivery, with any pregnancy- or postpartum-related emergencies, if you think you are in labor, or if your water has broken. For example, if you experience nausea and vomiting, vaginal bleeding, severe abdominal or pelvic pain, elevated blood pressure or other problems related to your pregnancy or postpartum time, please come to the Maternity Assessment Unit for assistance.        Obstetrics: Normal and Problem Pregnancies (7th ed., pp. 102-121). [place], [place]: Elsevier."> Textbook of Family Medicine (9th ed., pp. [phone]). [place], [place]: Else[employer].">  First Trimester of Pregnancy  The first trimester of pregnancy starts on the first day of your last menstrual period until the end of week 12. This is months 1 through 3 of pregnancy. A week after a sperm fertilizes an egg, the egg will implant into the wall of the uterus and begin to develop into a baby. By the end of 12 weeks, all the baby's organs will be formed and the baby will be 2-3 inches in size. Body changes during your first trimester Your body goes through many changes during pregnancy. The changes vary and generally return to normal after your baby is born. Physical changes  You may gain or lose weight.  Your breasts may begin to grow larger and become tender. The tissue that surrounds your nipples (areola) may become darker.  Dark spots or blotches (chloasma or mask of pregnancy) may develop on your face.  You may have changes in your hair. These can include thickening or thinning of your hair or changes in texture. Health changes  You may feel  nauseous, and you may vomit.  You may have heartburn.  You may develop headaches.  You may develop constipation.  Your gums may bleed and may be sensitive to brushing and flossing. Other changes  You may tire easily.  You may urinate more often.  Your menstrual periods will stop.  You may have a loss of appetite.  You may develop cravings for certain kinds of food.  You may have changes in your emotions from day to day.  You may have more vivid and strange dreams. Follow these instructions at home: Medicines  Follow your health care provider's instructions regarding medicine use. Specific medicines may be either safe or unsafe to take during pregnancy. Do not take any medicines unless told to by your health care provider.  Take a prenatal vitamin that contains at least 600 micrograms (mcg) of folic acid. Eating and drinking  Eat a healthy diet that includes fresh fruits and vegetables, whole grains, good sources of protein such as meat, eggs, or tofu, and low-fat dairy products.  Avoid raw meat and unpasteurized juice, milk, and cheese. These carry germs that can harm you and your baby.  If you feel nauseous or you vomit: ? Eat 4 or 5 small meals a day instead of 3 large meals. ? Try eating a few soda crackers. ? Drink liquids between meals instead of during meals.  You may need to take these actions to prevent or treat constipation: ? Drink enough fluid to keep your urine pale yellow. ? Eat foods that are high in fiber, such  as beans, whole grains, and fresh fruits and vegetables. ? Limit foods that are high in fat and processed sugars, such as fried or sweet foods. Activity  Exercise only as directed by your health care provider. Most people can continue their usual exercise routine during pregnancy. Try to exercise for 30 minutes at least 5 days a week.  Stop exercising if you develop pain or cramping in the lower abdomen or lower back.  Avoid exercising if it is  very hot or humid or if you are at high altitude.  Avoid heavy lifting.  If you choose to, you may have sex unless your health care provider tells you not to. Relieving pain and discomfort  Wear a good support bra to relieve breast tenderness.  Rest with your legs elevated if you have leg cramps or low back pain.  If you develop bulging veins (varicose veins) in your legs: ? Wear support hose as told by your health care provider. ? Elevate your feet for 15 minutes, 3-4 times a day. ? Limit salt in your diet. Safety  Wear your seat belt at all times when driving or riding in a car.  Talk with your health care provider if someone is verbally or physically abusive to you.  Talk with your health care provider if you are feeling sad or have thoughts of hurting yourself. Lifestyle  Do not use hot tubs, steam rooms, or saunas.  Do not douche. Do not use tampons or scented sanitary pads.  Do not use herbal remedies, alcohol, illegal drugs, or medicines that are not approved by your health care provider. Chemicals in these products can harm your baby.  Do not use any products that contain nicotine or tobacco, such as cigarettes, e-cigarettes, and chewing tobacco. If you need help quitting, ask your health care provider.  Avoid cat litter boxes and soil used by cats. These carry germs that can cause birth defects in the baby and possibly loss of the unborn baby (fetus) by miscarriage or stillbirth. General instructions  During routine prenatal visits in the first trimester, your health care provider will do a physical exam, perform necessary tests, and ask you how things are going. Keep all follow-up visits. This is important.  Ask for help if you have counseling or nutritional needs during pregnancy. Your health care provider can offer advice or refer you to specialists for help with various needs.  Schedule a dentist appointment. At home, brush your teeth with a soft toothbrush. Floss  gently.  Write down your questions. Take them to your prenatal visits. Where to find more information  American Pregnancy Association: americanpregnancy.org  [employer]: https://www.todd-brady.net/  Office on [employer] [facility]: MightyReward.co.nz Contact a health care provider if you have:  Dizziness.  A fever.  Mild pelvic cramps, pelvic pressure, or nagging pain in the abdominal area.  Nausea, vomiting, or diarrhea that lasts for 24 hours or longer.  A bad-smelling vaginal discharge.  Pain when you urinate.  Known exposure to a contagious illness, such as chickenpox, measles, Zika virus, HIV, or hepatitis. Get help right away if you have:  Spotting or bleeding from your vagina.  Severe abdominal cramping or pain.  Shortness of breath or chest pain.  Any kind of trauma, such as from a fall or a car crash.  New or increased pain, swelling, or redness in an arm or leg. Summary  The first trimester of pregnancy starts on the first day of your last menstrual period until  the end of week 12 (months 1 through 3).  Eating 4 or 5 small meals a day rather than 3 large meals may help to relieve nausea and vomiting.  Do not use any products that contain nicotine or tobacco, such as cigarettes, e-cigarettes, and chewing tobacco. If you need help quitting, ask your health care provider.  Keep all follow-up visits. This is important. This information is not intended to replace advice given to you by your health care provider. Make sure you discuss any questions you have with your health care provider. Document Revised: 10/29/2019 Document Reviewed: 09/04/2019 Elsevier Patient Education  2021 Elsevier Inc.                        Safe Medications in Pregnancy    Acne: Benzoyl Peroxide Salicylic Acid  Backache/Headache: Tylenol: 2 regular strength every 4 hours OR              2 Extra strength every 6  hours  Colds/Coughs/Allergies: Benadryl (alcohol free) 25 mg every 6 hours as needed Breath right strips Claritin Cepacol throat lozenges Chloraseptic throat spray Cold-Eeze- up to three times per day Cough drops, alcohol free Flonase (by prescription only) Guaifenesin Mucinex Robitussin DM (plain only, alcohol free) Saline nasal spray/drops Sudafed (pseudoephedrine) & Actifed ** use only after [redacted] weeks gestation and if you do not have high blood pressure Tylenol Vicks Vaporub Zinc lozenges Zyrtec   Constipation: Colace Ducolax suppositories Fleet enema Glycerin suppositories Metamucil Milk of magnesia Miralax Senokot Smooth move tea  Diarrhea: Kaopectate Imodium A-D  *NO pepto Bismol  Hemorrhoids: Anusol Anusol HC Preparation H Tucks  Indigestion: Tums Maalox Mylanta Zantac  Pepcid  Insomnia: Benadryl (alcohol free) 25mg  every 6 hours as needed Tylenol PM Unisom, no Gelcaps  Leg Cramps: Tums MagGel  Nausea/Vomiting:  Bonine Dramamine Emetrol Ginger extract Sea bands Meclizine  Nausea medication to take during pregnancy:  Unisom (doxylamine succinate 25 mg tablets) Take one tablet daily at bedtime. If symptoms are not adequately controlled, the dose can be increased to a maximum recommended dose of two tablets daily (1/2 tablet in the morning, 1/2 tablet mid-afternoon and one at bedtime). Vitamin B6 100mg  tablets. Take one tablet twice a day (up to 200 mg per day).  Skin Rashes: Aveeno products Benadryl cream or 25mg  every 6 hours as needed Calamine Lotion 1% cortisone cream  Yeast infection: Gyne-lotrimin 7 Monistat 7   **If taking multiple medications, please check labels to avoid duplicating the same active ingredients **take medication as directed on the label ** Do not exceed 4000 mg of tylenol in 24 hours **Do not take medications that contain aspirin or ibuprofen          You have constipation which is hard stools  that are difficult to pass. It is important to have regular bowel movements every 1-3 days that are soft and easy to pass. Hard stools increase your risk of hemorrhoids and are very uncomfortable.   To prevent constipation you can increase the amount of fiber in your diet. Examples of foods with fiber are leafy greens, whole grain breads, oatmeal and other grains.  It is also important to drink at least eight 8oz glass of water everyday.   If you have not has a bowel movement in 4-5 days you made need to clean out your bowel.  This will have establish normal movement through your bowel.    Miralax Clean out  Take 8 capfuls of miralax in  64 oz of gatorade. You can use any fluid that appeals to you (gatorade, water, juice)  Continue to drink at least eight 8 oz glasses of water throughout the day  You can repeat with another 8 capfuls of miralax in 64 oz of gatorade if you are not having a large amount of stools  You will need to be at home and close to a bathroom for about 8 hours when you do the above as you may need to go to the bathroom frequently.   After you are cleaned out: - Start Colace100mg  twice daily - Start Miralax once daily - Start a daily fiber supplement like metamucil or citrucel - You can safely use enemas in pregnancy  - if you are having diarrhea you can reduce to Colace once a day or miralax every other day or a 1/2 capful daily.

## 2020-10-06 NOTE — Progress Notes (Signed)
History:   Heather Noble is a 25 y.o. G2P1001 at [redacted]w[redacted]d by early ultrasound being seen today for her first obstetrical visit.  Her obstetrical history is significant for gHTN, C/S x1. Patient does intend to breast feed. Pregnancy history fully reviewed.  Allergies: NKDA Current Medications: PNVs PMH: none. No HTN, DM, asthma. PSH: C/S x1 OB Hx: 2018 (C/S - non-reassuring fetal status, gHTN, [redacted]w[redacted]d) Social Hx: pt does not smoke, drink, or use drugs. Family Hx: none  Patient reports no complaints.      HISTORY: OB History  Gravida Para Term Preterm AB Living  2 1 1  0 0 1  SAB IAB Ectopic Multiple Live Births  0 0 0 0 1    # Outcome Date GA Lbr Len/2nd Weight Sex Delivery Anes PTL Lv  2 Current           1 Term 04/21/17 [redacted]w[redacted]d  7 lb 7 oz (3.374 kg) M CS-LTranv   LIV    Last pap smear was done 10/2016 and was normal  Past Medical History:  Diagnosis Date  . Headache   . Pregnancy induced hypertension    Past Surgical History:  Procedure Laterality Date  . CESAREAN SECTION N/A 04/21/2017   Procedure: CESAREAN SECTION;  Surgeon: 04/23/2017, MD;  Location: [facility];  Service: Obstetrics;  Laterality: N/A;  . NO PAST SURGERIES    . WISDOM TOOTH EXTRACTION  2015   Family History  Problem Relation Age of Onset  . Hypertension Maternal Grandmother   . Osteoarthritis Maternal Grandmother   . Gout Maternal Grandmother   . Thyroid disease Maternal Grandmother   . Diabetes Maternal Grandmother   . Cancer Paternal Grandfather        unknown   Social History   Tobacco Use  . Smoking status: Never Smoker  . Smokeless tobacco: Never Used  Vaping Use  . Vaping Use: Never used  Substance Use Topics  . Alcohol use: No    Comment: occ  . Drug use: No   No Known Allergies Current Outpatient Medications on File Prior to Visit  Medication Sig Dispense Refill  . Blood Pressure Monitoring (BLOOD PRESSURE KIT) DEVI 1 kit by Does not apply route once a week. 1  each 0  . Prenatal MV & Min w/FA-DHA ([facility] PRENATAL GUMMY) 0.4-25 MG CHEW Chew 3 tablets by mouth daily. 90 tablet 12   No current facility-administered medications on file prior to visit.    Review of Systems Pertinent items noted in HPI and remainder of comprehensive ROS otherwise negative.  Physical Exam:   Vitals:   10/06/20 0947  BP: 127/85  Pulse: 89  Weight: 199 lb 14.4 oz (90.7 kg)   Fetal Heart Rate (bpm): 177  General: well-developed, well-nourished female in no acute distress  Breasts:  Pt declines  Skin: normal coloration and turgor, no rashes  Neurologic: oriented, normal, negative, normal mood  Extremities: normal strength, tone, and muscle mass, ROM of all joints is normal  HEENT PERRLA, extraocular movement intact and sclera clear, anicteric  Neck supple and no masses  Cardiovascular: regular rate and rhythm  Respiratory:  no respiratory distress, normal breath sounds  Abdomen: soft, non-tender; bowel sounds normal; no masses,  no organomegaly  Pelvic: normal external genitalia, no lesions, normal vaginal mucosa, normal vaginal discharge, normal cervix, pap smear done. Uterine size: c/w dates      Assessment:    Pregnancy: G2P1001 Patient Active Problem List   Diagnosis Date Noted  .  Encounter for supervision of normal pregnancy in first trimester 09/29/2020  . History of gestational hypertension 04/21/2017  . Sickle cell trait ([place]) 11/03/2016  . S/P cesarean section 10/16/2016  . Migraine with aura and without status migrainosus, not intractable 10/16/2014     Plan:    1. Encounter to determine fetal viability of pregnancy, single or unspecified fetus - US OB Limited; Future  2. Encounter for supervision of other normal pregnancy in first trimester - US OB Limited; Future - Cytology - PAP( [place]) - Cervicovaginal ancillary only( [place]) - Culture, OB Urine - Obstetric Panel, Including HIV - Hepatitis C Antibody  3. [redacted] weeks gestation  of pregnancy  4. Constipation during pregnancy in first trimester -Miralax protocol given   Initial labs drawn. Continue prenatal vitamins. Problem list reviewed and updated. Genetic Screening discussed, NIPS: requested. Ultrasound discussed; fetal anatomic survey: ordered. Anticipatory guidance about prenatal visits given including labs, ultrasounds, and testing. Discussed usage of Bab[employer]s and virtual visits as additional source of managing and completing prenatal visits in midst of coronavirus and pandemic.   Encouraged to complete MyChart Registration for her ability to review results, send requests, and have questions addressed.  The nature of [place] [facility] with multiple MDs and Advanced Practice Providers was explained to patient; also emphasized that residents, students are part of our team. Routine obstetric precautions reviewed. Encouraged to seek out care at office or emergency room [facility] preferred) for urgent and/or emergent concerns. Return in about 4 weeks (around 11/03/2020) for in-person LOB/APP OK/genetic testing next visit, needs anatomy scan appt.     Clarisa Fling, NP  10:53 AM 10/06/2020

## 2020-10-07 LAB — OBSTETRIC PANEL, INCLUDING HIV
Antibody Screen: NEGATIVE
Basophils Absolute: 0 10*3/uL (ref 0.0–0.2)
Basos: 0 %
EOS (ABSOLUTE): 0 10*3/uL (ref 0.0–0.4)
Eos: 0 %
HIV Screen 4th Generation wRfx: NONREACTIVE
Hematocrit: 28.1 % — ABNORMAL LOW (ref 34.0–46.6)
Hemoglobin: 9.1 g/dL — ABNORMAL LOW (ref 11.1–15.9)
Hepatitis B Surface Ag: NEGATIVE
Immature Grans (Abs): 0 10*3/uL (ref 0.0–0.1)
Immature Granulocytes: 0 %
Lymphocytes Absolute: 2.2 10*3/uL (ref 0.7–3.1)
Lymphs: 34 %
MCH: 27.8 pg (ref 26.6–33.0)
MCHC: 32.4 g/dL (ref 31.5–35.7)
MCV: 86 fL (ref 79–97)
Monocytes Absolute: 0.9 10*3/uL (ref 0.1–0.9)
Monocytes: 14 %
Neutrophils Absolute: 3.3 10*3/uL (ref 1.4–7.0)
Neutrophils: 52 %
Platelets: 421 10*3/uL (ref 150–450)
RBC: 3.27 x10E6/uL — ABNORMAL LOW (ref 3.77–5.28)
RDW: 16.5 % — ABNORMAL HIGH (ref 11.7–15.4)
RPR Ser Ql: NONREACTIVE
Rh Factor: POSITIVE
Rubella Antibodies, IGG: <0.9 index — ABNORMAL LOW (ref >0.99)
WBC: 6.3 10*3/uL (ref 3.4–10.8)

## 2020-10-07 LAB — MOLECULAR ANCILLARY ONLY
Chlamydia: NEGATIVE
Comment: NEGATIVE
Comment: NORMAL
Neisseria Gonorrhea: NEGATIVE

## 2020-10-07 LAB — HEPATITIS C ANTIBODY: Hep C Virus Ab: <0.1 s/co ratio (ref 0.0–0.9)

## 2020-10-08 LAB — URINE CULTURE, OB REFLEX

## 2020-10-08 LAB — CULTURE, OB URINE

## 2020-10-11 LAB — CYTOLOGY - PAP: Diagnosis: NEGATIVE

## 2020-10-27 ENCOUNTER — Telehealth: Payer: Self-pay

## 2020-10-27 DIAGNOSIS — O219 Vomiting of pregnancy, unspecified: Secondary | ICD-10-CM

## 2020-10-27 MED ORDER — DOXYLAMINE-PYRIDOXINE 10-10 MG PO TBEC: 2.0000 | DELAYED_RELEASE_TABLET | Freq: Every day | ORAL | 2 refills | Status: DC

## 2020-10-27 NOTE — Telephone Encounter (Signed)
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

## 2020-11-03 ENCOUNTER — Ambulatory Visit (INDEPENDENT_AMBULATORY_CARE_PROVIDER_SITE_OTHER): Payer: Medicaid Other | Admitting: Women's Health

## 2020-11-03 ENCOUNTER — Other Ambulatory Visit: Payer: Self-pay

## 2020-11-03 VITALS — BP 127/85 | HR 80 | Wt 204.8 lb

## 2020-11-03 DIAGNOSIS — O99011 Anemia complicating pregnancy, first trimester: Secondary | ICD-10-CM

## 2020-11-03 DIAGNOSIS — Z3481 Encounter for supervision of other normal pregnancy, first trimester: Secondary | ICD-10-CM

## 2020-11-03 DIAGNOSIS — O09899 Supervision of other high risk pregnancies, unspecified trimester: Secondary | ICD-10-CM

## 2020-11-03 DIAGNOSIS — Z3A13 13 weeks gestation of pregnancy: Secondary | ICD-10-CM

## 2020-11-03 DIAGNOSIS — D573 Sickle-cell trait: Secondary | ICD-10-CM

## 2020-11-03 DIAGNOSIS — Z8759 Personal history of other complications of pregnancy, childbirth and the puerperium: Secondary | ICD-10-CM

## 2020-11-03 DIAGNOSIS — Z2839 Other underimmunization status: Secondary | ICD-10-CM

## 2020-11-03 DIAGNOSIS — Z98891 History of uterine scar from previous surgery: Secondary | ICD-10-CM

## 2020-11-03 MED ORDER — ASPIRIN EC 81 MG PO TBEC: 81.0000 mg | DELAYED_RELEASE_TABLET | Freq: Every day | ORAL | 6 refills | Status: DC

## 2020-11-03 MED ORDER — FERROUS SULFATE 324 (65 FE) MG PO TBEC: 1.0000 | DELAYED_RELEASE_TABLET | ORAL | 6 refills | Status: DC

## 2020-11-03 NOTE — Progress Notes (Signed)
Subjective:  Zoeya Gramajo is a 25 y.o. G2P1001 at [redacted]w[redacted]d being seen today for ongoing prenatal care.  She is currently monitored for the following issues for this low-risk pregnancy and has Migraine with aura and without status migrainosus, not intractable; S/P cesarean section; Sickle cell trait (HCC); Rubella non-immune status, antepartum; Anemia in pregnancy; History of gestational hypertension; and Encounter for supervision of normal pregnancy in first trimester on their problem list.  Patient reports no complaints.  Contractions: Not present. Vag. Bleeding: None.  Movement: Present. Denies leaking of fluid.   The following portions of the patient's history were reviewed and updated as appropriate: allergies, current medications, past family history, past medical history, past social history, past surgical history and problem list. Problem list updated.  Objective:   Vitals:   11/03/20 1134  BP: 127/85  Pulse: 80  Weight: 204 lb 12.8 oz (92.9 kg)    Fetal Status: Fetal Heart Rate (bpm): 141   Movement: Present     General:  Alert, oriented and cooperative. Patient is in no acute distress.  Skin: Skin is warm and dry. No rash noted.   Cardiovascular: Normal heart rate noted  Respiratory: Normal respiratory effort, no problems with respiration noted  Abdomen: Soft, gravid, appropriate for gestational age. Pain/Pressure: Absent     Pelvic: Vag. Bleeding: None     Cervical exam deferred        Extremities: Normal range of motion.  Edema: None  Mental Status: Normal mood and affect. Normal behavior. Normal judgment and thought content.   Urinalysis:      Assessment and Plan:  Pregnancy: G2P1001 at [redacted]w[redacted]d  1. Encounter for supervision of other normal pregnancy in first trimester -genetic screening today  2. History of gestational hypertension -BP today 127/85 -RX low dose ASA -CMP/PCr today  3. Sickle cell trait (HCC)  4. Anemia during pregnancy in first  trimester -discussed oral iron vs. Venofer, pt elects oral iron with repeat CBC in 4 weeks  5. [redacted] weeks gestation of pregnancy   Preterm labor symptoms and general obstetric precautions including but not limited to vaginal bleeding, contractions, leaking of fluid and fetal movement were reviewed in detail with the patient. I discussed the assessment and treatment plan with the patient. The patient was provided an opportunity to ask questions and all were answered. The patient agreed with the plan and demonstrated an understanding of the instructions. The patient was advised to call back or seek an in-person office evaluation/go to [facility]U at [facility] for any urgent or concerning symptoms. Please refer to After Visit Summary for other counseling recommendations.  Return in about 4 weeks (around 12/01/2020) for in-person LOB/APP OK.   Kalinda Romaniello, Odie Sera, NP

## 2020-11-03 NOTE — Patient Instructions (Addendum)
Maternity Assessment Unit (MAU)  The Maternity Assessment Unit (MAU) is located at the [facility] at [facility]. The address is: [address], [place], [place], [place] [address]. Please see map below for additional directions.    The Maternity Assessment Unit is designed to help you during your pregnancy, and for up to 6 weeks after delivery, with any pregnancy- or postpartum-related emergencies, if you think you are in labor, or if your water has broken. For example, if you experience nausea and vomiting, vaginal bleeding, severe abdominal or pelvic pain, elevated blood pressure or other problems related to your pregnancy or postpartum time, please come to the Maternity Assessment Unit for assistance.        Second Trimester of Pregnancy  The second trimester of pregnancy is from week 13 through week 27. This is months 4 through 6 of pregnancy. The second trimester is often a time when you feel your best. Your body has adjusted to being pregnant, and you begin to feel better physically. During the second trimester:  Morning sickness has lessened or stopped completely.  You may have more energy.  You may have an increase in appetite. The second trimester is also a time when the unborn baby (fetus) is growing rapidly. At the end of the sixth month, the fetus may be up to 12 inches long and weigh about 1 pounds. You will likely begin to feel the baby move (quickening) between 16 and 20 weeks of pregnancy. Body changes during your second trimester Your body continues to go through many changes during your second trimester. The changes vary and generally return to normal after the baby is born. Physical changes  Your weight will continue to increase. You will notice your lower abdomen bulging out.  You may begin to get stretch marks on your hips, abdomen, and breasts.  Your breasts will continue to grow and to become tender.  Dark spots or  blotches (chloasma or mask of pregnancy) may develop on your face.  A dark line from your belly button to the pubic area (linea nigra) may appear.  You may have changes in your hair. These can include thickening of your hair, rapid growth, and changes in texture. Some people also have hair loss during or after pregnancy, or hair that feels dry or thin. Health changes  You may develop headaches.  You may have heartburn.  You may develop constipation.  You may develop hemorrhoids or swollen, bulging veins (varicose veins).  Your gums may bleed and may be sensitive to brushing and flossing.  You may urinate more often because the fetus is pressing on your bladder.  You may have back pain. This is caused by: ? Weight gain. ? Pregnancy hormones that are relaxing the joints in your pelvis. ? A shift in weight and the muscles that support your balance. Follow these instructions at home: Medicines  Follow your health care provider's instructions regarding medicine use. Specific medicines may be either safe or unsafe to take during pregnancy. Do not take any medicines unless approved by your health care provider.  Take a prenatal vitamin that contains at least 600 micrograms (mcg) of folic acid. Eating and drinking  Eat a healthy diet that includes fresh fruits and vegetables, whole grains, good sources of protein such as meat, eggs, or tofu, and low-fat dairy products.  Avoid raw meat and unpasteurized juice, milk, and cheese. These carry germs that can harm you and your baby.  You may need  to take these actions to prevent or treat constipation: ? Drink enough fluid to keep your urine pale yellow. ? Eat foods that are high in fiber, such as beans, whole grains, and fresh fruits and vegetables. ? Limit foods that are high in fat and processed sugars, such as fried or sweet foods. Activity  Exercise only as directed by your health care provider. Most people can continue their usual  exercise routine during pregnancy. Try to exercise for 30 minutes at least 5 days a week. Stop exercising if you develop contractions in your uterus.  Stop exercising if you develop pain or cramping in the lower abdomen or lower back.  Avoid exercising if it is very hot or humid or if you are at a high altitude.  Avoid heavy lifting.  If you choose to, you may have sex unless your health care provider tells you not to. Relieving pain and discomfort  Wear a supportive bra to prevent discomfort from breast tenderness.  Take warm sitz baths to soothe any pain or discomfort caused by hemorrhoids. Use hemorrhoid cream if your health care provider approves.  Rest with your legs raised (elevated) if you have leg cramps or low back pain.  If you develop varicose veins: ? Wear support hose as told by your health care provider. ? Elevate your feet for 15 minutes, 3-4 times a day. ? Limit salt in your diet. Safety  Wear your seat belt at all times when driving or riding in a car.  Talk with your health care provider if someone is verbally or physically abusive to you. Lifestyle  Do not use hot tubs, steam rooms, or saunas.  Do not douche. Do not use tampons or scented sanitary pads.  Avoid cat litter boxes and soil used by cats. These carry germs that can cause birth defects in the baby and possibly loss of the fetus by miscarriage or stillbirth.  Do not use herbal remedies, alcohol, illegal drugs, or medicines that are not approved by your health care provider. Chemicals in these products can harm your baby.  Do not use any products that contain nicotine or tobacco, such as cigarettes, e-cigarettes, and chewing tobacco. If you need help quitting, ask your health care provider. General instructions  During a routine prenatal visit, your health care provider will do a physical exam and other tests. He or she will also discuss your overall health. Keep all follow-up visits. This is  important.  Ask your health care provider for a referral to a local prenatal education class.  Ask for help if you have counseling or nutritional needs during pregnancy. Your health care provider can offer advice or refer you to specialists for help with various needs. Where to find more information  American Pregnancy Association: americanpregnancy.org  [employer]: https://www.todd-brady.net/  Office on [employer] [facility]: Might[employer]Rew[employer].nz Contact a health care provider if you have:  A headache that does not go away when you take medicine.  Vision changes or you see spots in front of your eyes.  Mild pelvic cramps, pelvic pressure, or nagging pain in the abdominal area.  Persistent nausea, vomiting, or diarrhea.  A bad-smelling vaginal discharge or foul-smelling urine.  Pain when you urinate.  Sudden or extreme swelling of your face, hands, ankles, feet, or legs.  A fever. Get help right away if you:  Have fluid leaking from your vagina.  Have spotting or bleeding from your vagina.  Have severe abdominal cramping or pain.  Have difficulty  breathing.  Have chest pain.  Have fainting spells.  Have not felt your baby move for the time period told by your health care provider.  Have new or increased pain, swelling, or redness in an arm or leg. Summary  The second trimester of pregnancy is from week 13 through week 27 (months 4 through 6).  Do not use herbal remedies, alcohol, illegal drugs, or medicines that are not approved by your health care provider. Chemicals in these products can harm your baby.  Exercise only as directed by your health care provider. Most people can continue their usual exercise routine during pregnancy.  Keep all follow-up visits. This is important. This information is not intended to replace advice given to you by your health care provider. Make sure you discuss any questions you  have with your health care provider. Document Revised: 10/29/2019 Document Reviewed: 09/04/2019 Elsevier Patient Education  2021 Elsevier Inc.        Preterm Labor The normal length of a pregnancy is 39-41 weeks. Preterm labor is when labor starts before 37 completed weeks of pregnancy. Babies who are born prematurely and survive may not be fully developed and may be at an increased risk for long-term problems such as cerebral palsy, developmental delays, and vision and hearing problems. Babies who are born too early may have problems soon after birth. Problems may include regulating blood sugar, body temperature, heart rate, and breathing rate. These babies often have trouble with feeding. The risk of having problems is highest for babies who are born before 34 weeks of pregnancy. What are the causes? The exact cause of this condition is not known. What increases the risk? You are more likely to have preterm labor if you have certain risk factors that relate to your medical history, problems with present and past pregnancies, and lifestyle factors. Medical history  You have abnormalities of the uterus, including a short cervix.  You have STIs (sexually transmitted infections), or other infections of the urinary tract and the vagina.  You have chronic illnesses, such as blood clotting problems, diabetes, or high blood pressure.  You are overweight or underweight. Present and past pregnancies  You have had preterm labor before.  You are pregnant with twins or other multiples.  You have been diagnosed with a condition in which the placenta covers your cervix (placenta previa).  You waited less than 6 months between giving birth and becoming pregnant again.  Your unborn baby has some abnormalities.  You have vaginal bleeding during pregnancy.  You became pregnant through in vitro fertilization (IVF). Lifestyle and environmental factors  You use tobacco products.  You drink  alcohol.  You use street drugs.  You have stress and no social support.  You experience domestic violence.  You are exposed to certain chemicals or environmental pollutants. Other factors  You are younger than age 71 or older than age 35. What are the signs or symptoms? Symptoms of this condition include:  Cramps similar to those that can happen during a menstrual period. The cramps may happen with diarrhea.  Pain in the abdomen or lower back.  Regular contractions that may feel like tightening of the abdomen.  A feeling of increased pressure in the pelvis.  Increased watery or bloody mucus discharge from the vagina.  Water breaking (ruptured amniotic sac). How is this diagnosed? This condition is diagnosed based on:  Your medical history and a physical exam.  A pelvic exam.  An ultrasound.  Monitoring your uterus for contractions.  Other tests, including: ? A swab of the cervix to check for a chemical called fetal fibronectin. ? Urine tests. How is this treated? Treatment for this condition depends on the length of your pregnancy, your condition, and the health of your baby. Treatment may include:  Taking medicines, such as: ? Hormone medicines. These may be given early in pregnancy to help support the pregnancy. ? Medicines to stop contractions. ? Medicines to help mature the baby's lungs. These may be prescribed if the risk of delivery is high. ? Medicines to prevent your baby from developing cerebral palsy.  Bed rest. If the labor happens before 34 weeks of pregnancy, you may need to stay in the hospital.  Delivery of the baby. Follow these instructions at home:  Do not use any products that contain nicotine or tobacco, such as cigarettes, e-cigarettes, and chewing tobacco. If you need help quitting, ask your health care provider.  Do not drink alcohol.  Take over-the-counter and prescription medicines only as told by your health care provider.  Rest as  told by your health care provider.  Return to your normal activities as told by your health care provider. Ask your health care provider what activities are safe for you.  Keep all follow-up visits as told by your health care provider. This is important.   How is this prevented? To increase your chance of having a full-term pregnancy:  Do not use street drugs or medicines that have not been prescribed to you during your pregnancy.  Talk with your health care provider before taking any herbal supplements, even if you have been taking them regularly.  Make sure you gain a healthy amount of weight during your pregnancy.  Watch for infection. If you think that you might have an infection, get it checked right away. Symptoms of infection may include: ? Fever. ? Abnormal vaginal discharge or discharge that smells bad. ? Pain or burning with urination. ? Needing to urinate urgently. ? Frequently urinating or passing small amounts of urine frequently. ? Blood in your urine. ? Urine that smells bad or unusual.  Tell your health care provider if you have had preterm labor before. Contact a health care provider if:  You think you are going into preterm labor.  You have signs or symptoms of preterm labor.  You have symptoms of infection. Get help right away if:  You are having regular, painful contractions every 5 minutes or less.  Your water breaks. Summary  Preterm labor is labor that starts before you reach 37 weeks of pregnancy.  Delivering your baby early increases your baby's risk of developing lifelong problems.  The exact cause of preterm labor is unknown. However, having an abnormal uterus, an STI (sexually transmitted infection), or vaginal bleeding during pregnancy increases your risk for preterm labor.  Keep all follow-up visits as told by your health care provider. This is important.  Contact a health care provider if you have signs or symptoms of preterm labor. This  information is not intended to replace advice given to you by your health care provider. Make sure you discuss any questions you have with your health care provider. Document Revised: 06/24/2019 Document Reviewed: 06/24/2019 Elsevier Patient Education  2021 Elsevier Inc.       Your bloodwork shows that you have anemia. I have sent you a prescription for ferrous sulfate 325mg  OTC. Take 1 tablet, every other day. Take iron without food, if possible. If it must be taken with food due to upset  stomach, take with orange juice. Avoid taking with milk, cereal, tea, coffee and eggs. Common side effects of iron include: nausea, vomiting gas, constipation, diarrhea and black or green stool. If side effects prevent taking, you may take with orange juice to decrease stomach upset and increase absorption. Please contact our office if you have any questions or concerns. If you have side effects that are intolerable, please contact our office so your medication can be adjusted.                        Safe Medications in Pregnancy    Acne: Benzoyl Peroxide Salicylic Acid  Backache/Headache: Tylenol: 2 regular strength every 4 hours OR              2 Extra strength every 6 hours  Colds/Coughs/Allergies: Benadryl (alcohol free) 25 mg every 6 hours as needed Breath right strips Claritin Cepacol throat lozenges Chloraseptic throat spray Cold-Eeze- up to three times per day Cough drops, alcohol free Flonase (by prescription only) Guaifenesin Mucinex Robitussin DM (plain only, alcohol free) Saline nasal spray/drops Sudafed (pseudoephedrine) & Actifed ** use only after [redacted] weeks gestation and if you do not have high blood pressure Tylenol Vicks Vaporub Zinc lozenges Zyrtec   Constipation: Colace Ducolax suppositories Fleet enema Glycerin suppositories Metamucil Milk of magnesia Miralax Senokot Smooth move tea  Diarrhea: Kaopectate Imodium A-D  *NO pepto  Bismol  Hemorrhoids: Anusol Anusol HC Preparation H Tucks  Indigestion: Tums Maalox Mylanta Zantac  Pepcid  Insomnia: Benadryl (alcohol free) 25mg  every 6 hours as needed Tylenol PM Unisom, no Gelcaps  Leg Cramps: Tums MagGel  Nausea/Vomiting:  Bonine Dramamine Emetrol Ginger extract Sea bands Meclizine  Nausea medication to take during pregnancy:  Unisom (doxylamine succinate 25 mg tablets) Take one tablet daily at bedtime. If symptoms are not adequately controlled, the dose can be increased to a maximum recommended dose of two tablets daily (1/2 tablet in the morning, 1/2 tablet mid-afternoon and one at bedtime). Vitamin B6 100mg  tablets. Take one tablet twice a day (up to 200 mg per day).  Skin Rashes: Aveeno products Benadryl cream or 25mg  every 6 hours as needed Calamine Lotion 1% cortisone cream  Yeast infection: Gyne-lotrimin 7 Monistat 7   **If taking multiple medications, please check labels to avoid duplicating the same active ingredients **take medication as directed on the label ** Do not exceed 4000 mg of tylenol in 24 hours **Do not take medications that contain aspirin or ibuprofen

## 2020-11-04 ENCOUNTER — Inpatient Hospital Stay (HOSPITAL_COMMUNITY): Payer: BC Managed Care – PPO

## 2020-11-04 ENCOUNTER — Inpatient Hospital Stay (HOSPITAL_COMMUNITY)
Admission: EM | Admit: 2020-11-04 | Discharge: 2020-11-04 | Disposition: A | Discharge status: Home / Self Care | Payer: BC Managed Care – PPO | Attending: Obstetrics & Gynecology | Admitting: Obstetrics & Gynecology

## 2020-11-04 ENCOUNTER — Other Ambulatory Visit: Payer: Self-pay

## 2020-11-04 ENCOUNTER — Encounter (HOSPITAL_COMMUNITY): Payer: Self-pay | Admitting: Emergency Medicine

## 2020-11-04 DIAGNOSIS — R1030 Lower abdominal pain, unspecified: Secondary | ICD-10-CM | POA: Insufficient documentation

## 2020-11-04 DIAGNOSIS — Z79899 Other long term (current) drug therapy: Secondary | ICD-10-CM | POA: Diagnosis not present

## 2020-11-04 DIAGNOSIS — R110 Nausea: Secondary | ICD-10-CM | POA: Insufficient documentation

## 2020-11-04 DIAGNOSIS — O26892 Other specified pregnancy related conditions, second trimester: Secondary | ICD-10-CM | POA: Diagnosis not present

## 2020-11-04 DIAGNOSIS — R55 Syncope and collapse: Secondary | ICD-10-CM

## 2020-11-04 DIAGNOSIS — R42 Dizziness and giddiness: Secondary | ICD-10-CM | POA: Insufficient documentation

## 2020-11-04 DIAGNOSIS — O99891 Other specified diseases and conditions complicating pregnancy: Secondary | ICD-10-CM | POA: Diagnosis not present

## 2020-11-04 DIAGNOSIS — R519 Headache, unspecified: Secondary | ICD-10-CM | POA: Insufficient documentation

## 2020-11-04 DIAGNOSIS — Z3A14 14 weeks gestation of pregnancy: Secondary | ICD-10-CM | POA: Diagnosis not present

## 2020-11-04 LAB — URINALYSIS, ROUTINE W REFLEX MICROSCOPIC
Bilirubin Urine: NEGATIVE
Glucose, UA: NEGATIVE mg/dL
Hgb urine dipstick: NEGATIVE
Ketones, ur: 5 mg/dL — AB
Nitrite: NEGATIVE
Protein, ur: NEGATIVE mg/dL
Specific Gravity, Urine: 1.006 (ref 1.005–1.030)
pH: 7 (ref 5.0–8.0)

## 2020-11-04 LAB — COMPREHENSIVE METABOLIC PANEL
ALT: 11 IU/L (ref 0–32)
AST: 19 IU/L (ref 0–40)
Albumin/Globulin Ratio: 1.2 (ref 1.2–2.2)
Albumin: 3.7 g/dL — ABNORMAL LOW (ref 3.9–5.0)
Alkaline Phosphatase: 49 IU/L (ref 44–121)
BUN/Creatinine Ratio: 7 — ABNORMAL LOW (ref 9–23)
BUN: 5 mg/dL — ABNORMAL LOW (ref 6–20)
Bilirubin Total: 0.3 mg/dL (ref 0.0–1.2)
CO2: 18 mmol/L — ABNORMAL LOW (ref 20–29)
Calcium: 9 mg/dL (ref 8.7–10.2)
Chloride: 104 mmol/L (ref 96–106)
Creatinine, Ser: 0.67 mg/dL (ref 0.57–1.00)
Globulin, Total: 3.1 g/dL (ref 1.5–4.5)
Glucose: 88 mg/dL (ref 65–99)
Potassium: 4.2 mmol/L (ref 3.5–5.2)
Sodium: 138 mmol/L (ref 134–144)
Total Protein: 6.8 g/dL (ref 6.0–8.5)
eGFR: 124 mL/min/{1.73_m2} (ref >59)

## 2020-11-04 LAB — PROTEIN / CREATININE RATIO, URINE
Creatinine, Urine: 144.5 mg/dL
Protein, Ur: 13.3 mg/dL
Protein/Creat Ratio: 92 mg/g creat (ref 0–200)

## 2020-11-04 LAB — GLUCOSE, CAPILLARY: Glucose-Capillary: 85 mg/dL (ref 70–99)

## 2020-11-04 LAB — HEMOGLOBIN AND HEMATOCRIT, BLOOD
HCT: 27.3 % — ABNORMAL LOW (ref 36.0–46.0)
Hemoglobin: 9 g/dL — ABNORMAL LOW (ref 12.0–15.0)

## 2020-11-04 MED ORDER — ACETAMINOPHEN 500 MG PO TABS
1000.0000 mg | ORAL_TABLET | Freq: Once | ORAL | Status: AC
  Administered 2020-11-04: 1000 mg via ORAL
  Filled 2020-11-04: qty 2

## 2020-11-04 NOTE — MAU Note (Signed)
Pt reports feeling light headed at work. Pt reports abdominal cramping in belly.

## 2020-11-04 NOTE — Discharge Instructions (Signed)
Eating Plan for Pregnant Women While you are pregnant, your body requires additional nutrition to help support your growing baby. You also have a higher need for some vitamins and minerals, such as folic acid, calcium, iron, and vitamin D. Eating a healthy, well-balanced diet is very important for your health and your baby's health. Your need for extra calories varies over the course of your pregnancy. Pregnancy is divided into three trimesters, with each trimester lasting 3 months. For most women, it is recommended to consume:  150 extra calories a day during the first trimester.  300 extra calories a day during the second trimester.  300 extra calories a day during the third trimester. What are tips for following this plan? Cooking  Practice good food safety and cleanliness. Wash your hands before you eat and after you prepare raw meat. Wash all fruits and vegetables well before peeling or eating. Taking these actions can help to prevent foodborne illnesses that can be very dangerous to your baby, such as listeriosis. Ask your health care provider for more information about listeriosis.  Make sure that all meats, poultry, and eggs are cooked to food-safe temperatures or "well-done." Meal planning  Eat a variety of foods (especially fruits and vegetables) to get a full range of vitamins and minerals.  Two or more servings of fish are recommended each week in order to get the most benefits from omega-3 fatty acids that are found in seafood. Choose fish that are lower in mercury, such as salmon and pollock.  Limit your overall intake of foods that have "empty calories." These are foods that have little nutritional value, such as sweets, desserts, candies, and sugar-sweetened beverages.  Drinks that contain caffeine are okay to drink, but it is better to avoid caffeine. Keep your total caffeine intake to less than 200 mg each day (which is 12 oz or 355 mL of coffee, tea, or soda) or the limit as  told by your health care provider.   General information  Do not try to lose weight or go on a diet during pregnancy.  Take a prenatal vitamin to help meet your additional vitamin and mineral needs during pregnancy, specifically for folic acid, iron, calcium, and vitamin D.  Remember to stay active. Ask your health care provider what types of exercise and activities are safe for you. What does 150 extra calories look like? Healthy options that provide 150 extra calories each day could be any of the following:  6-8 oz (170-227 g) plain low-fat yogurt with  cup (70 g) berries.  1 apple with 2 tsp (11 g) peanut butter.  Cut-up vegetables with  cup (60 g) hummus.  8 fl oz (237 mL) low-fat chocolate milk.  1 stick of string cheese with 1 medium orange.  1 peanut butter and jelly sandwich that is made with one slice of whole-wheat bread and 1 tsp (5 g) of peanut butter. For 300 extra calories, you could eat two of these healthy options each day. What is a healthy amount of weight to gain? The right amount of weight gain for you is based on your BMI (body mass index) before you became pregnant.  If your BMI was less than 18 (underweight), you should gain 28-40 lb (13-18 kg).  If your BMI was 18-24.9 (normal), you should gain 25-35 lb (11-16 kg).  If your BMI was 25-29.9 (overweight), you should gain 15-25 lb (7-11 kg).  If your BMI was 30 or greater (obese), you should gain 11-20 lb (  5-9 kg). What if I am having twins or multiples? Generally, if you are carrying twins or multiples:  You may need to eat 300-600 extra calories a day.  The recommended range for total weight gain is 25-54 lb (11-25 kg), depending on your BMI before pregnancy.  Talk with your health care provider to find out about nutritional needs, weight gain, and exercise that is right for you. What foods should I eat? Fruits All fruits. Eat a variety of colors and types of fruit. Remember to wash your fruits well  before peeling or eating. Vegetables All vegetables. Eat a variety of colors and types of vegetables. Remember to wash your vegetables well before peeling or eating. Grains All grains. Choose whole grains, such as whole-wheat bread, oatmeal, or brown rice. Meats and other protein foods Lean meats, including chicken, [place], and lean cuts of beef, veal, or pork. Fish that is higher in omega-3 fatty acids and lower in mercury, such as salmon, herring, mussels, trout, sardines, pollock, shrimp, crab, and lobster. Tofu. Tempeh. Beans. Eggs. Peanut butter and other nut butters. Dairy Pasteurized milk and milk alternatives, such as almond milk. Pasteurized yogurt and pasteurized cheese. Cottage cheese. Sour cream. Beverages Water. Juices that contain 100% fruit juice or vegetable juice. Caffeine-free teas and decaffeinated coffee. Fats and oils Fats and oils are okay to include in moderation. Sweets and desserts Sweets and desserts are okay to include in moderation. Seasoning and other foods All pasteurized condiments. The items listed above may not be a complete list of foods and beverages you can eat. Contact a dietitian for more information.   What foods should I avoid? Fruits Raw (unpasteurized) fruit juices. Vegetables Unpasteurized vegetable juices. Meats and other protein foods Precooked or cured meat, such as bologna, hot dogs, sausages, or meat loaves. (If you must eat those meats, reheat them until they are steaming hot.) Refrigerated pate, meat spreads from a meat counter, or smoked seafood that is found in the refrigerated section of a store. Raw or undercooked meats, poultry, and eggs. Raw fish, such as sushi or sashimi. Fish that have high mercury content, such as tilefish, shark, swordfish, and king mackerel. Dairy Unpasteurized milk and any foods that have unpasteurized milk in them. Soft cheeses, such as feta, queso blanco, queso fresco, [place], [place], panela, and blue-veined  cheeses (unless they are made with pasteurized milk, which must be stated on the label). Beverages Alcohol. Sugar-sweetened beverages, such as sodas, teas, or energy drinks. Seasoning and other foods Homemade fermented foods and drinks, such as pickles, sauerkraut, or kombucha drinks. (Store-bought pasteurized versions of these are okay.) Salads that are made in a store or deli, such as ham salad, chicken salad, egg salad, tuna salad, and seafood salad. The items listed above may not be a complete list of foods and beverages you should avoid. Contact a dietitian for more information. Where to find more information To calculate the number of calories you need based on your height, weight, and activity level, you can use an online calculator such as:  https://www.hunter.com/ To calculate how much weight you should gain during pregnancy, you can use an online pregnancy weight gain calculator such as:  MassVoice.es To learn more about eating fish during pregnancy, talk with your health care provider or visit:  [employer] Summary  While you are pregnant, your body requires additional nutrition to help support your growing baby.  Eat a variety of foods, especially fruits and vegetables, to get a full range of vitamins and minerals.  Practice good food safety and cleanliness. Wash your hands before you eat and after you prepare raw meat. Wash all fruits and vegetables well before peeling or eating. Taking these actions can help to prevent foodborne illnesses, such as listeriosis, that can be very dangerous to your baby.  Do not eat raw meat or fish. Do not eat fish that have high mercury content, such as tilefish, shark, swordfish, and king mackerel. Do not eat raw (unpasteurized) dairy.  Take a prenatal vitamin to help meet your additional vitamin and mineral needs during pregnancy, specifically for folic acid, iron, calcium, and vitamin D. This information is not intended to replace  advice given to you by your health care provider. Make sure you discuss any questions you have with your health care provider. Document Revised: 12/18/2019 Document Reviewed: 12/18/2019 Elsevier Patient Education  2021 [employer].

## 2020-11-04 NOTE — MAU Provider Note (Signed)
History     CSN: [id number]  Arrival date and time: 11/04/20 1628   Event Date/Time   First Provider Initiated Contact with Patient 11/04/20 1755      Chief Complaint  Patient presents with  . Loss of Consciousness   HPI  Heather Noble is a 25 y.o. G2P1001 at [redacted]w[redacted]d who presents from the ED for evaluation of a syncopal episode. Incident occurred around 430 pm today. States she was dizzy & nauseated immediately prior to event. Lost consciousness & hit the back of her head on the ground. LOC only lasted for a few seconds. Had a slushy this morning; otherwise no food or water. Denies chest pain, SOB, or palpitations. Endorses headache since event. Rates pain 7/10. Hasn't treated symptoms. Nothing makes worse. Denies abdominal pain or vaginal bleeding.   OB History    Gravida  2   Para  1   Term  1   Preterm  0   AB  0   Living  1     SAB  0   IAB  0   Ectopic  0   Multiple  0   Live Births  1           Past Medical History:  Diagnosis Date  . Headache   . Pregnancy induced hypertension     Past Surgical History:  Procedure Laterality Date  . CESAREAN SECTION N/A 04/21/2017   Procedure: CESAREAN SECTION;  Surgeon: Osborne Oman, MD;  Location: [facility];  Service: Obstetrics;  Laterality: N/A;  . WISDOM TOOTH EXTRACTION  2015    Family History  Problem Relation Age of Onset  . Hypertension Maternal Grandmother   . Osteoarthritis Maternal Grandmother   . Gout Maternal Grandmother   . Thyroid disease Maternal Grandmother   . Diabetes Maternal Grandmother   . Cancer Paternal Grandfather        unknown    Social History   Tobacco Use  . Smoking status: Never Smoker  . Smokeless tobacco: Never Used  Vaping Use  . Vaping Use: Never used  Substance Use Topics  . Alcohol use: No    Comment: occ  . Drug use: No    Allergies: No Known Allergies  Medications Prior to Admission  Medication Sig Dispense Refill Last Dose  . aspirin  EC 81 MG tablet Take 1 tablet (81 mg total) by mouth daily. Swallow whole. 30 tablet 6   . Blood Pressure Monitoring (BLOOD PRESSURE KIT) DEVI 1 kit by Does not apply route once a week. (Patient not taking: Reported on 11/03/2020) 1 each 0   . Doxylamine-Pyridoxine (DICLEGIS) 10-10 MG TBEC Take 2 tablets by mouth at bedtime. If symptoms persist, add one tablet in the morning and one in the afternoon (Patient not taking: Reported on 11/03/2020) 100 tablet 2   . ferrous sulfate 324 (65 Fe) MG TBEC Take 1 tablet (325 mg total) by mouth every other day. 30 tablet 6   . Prenatal MV & Min w/FA-DHA ([facility] PRENATAL GUMMY) 0.4-25 MG CHEW Chew 3 tablets by mouth daily. 90 tablet 12     Review of Systems  Eyes: Negative for visual disturbance.  Gastrointestinal: Positive for nausea. Negative for constipation, diarrhea and vomiting.  Musculoskeletal: Negative for neck pain.  Neurological: Positive for dizziness, syncope, light-headedness and headaches. Negative for speech difficulty.   Physical Exam   Blood pressure 124/75, pulse 66, temperature 98.6 F (37 C), resp. rate 20, height $RemoveBe'5\' 5"'RoWqiIbMZ$  (1.651 m), weight 92.5  kg, last menstrual period 07/21/2020, SpO2 100 %.  Orthostatic VS for the past 24 hrs:  BP- Lying Pulse- Lying BP- Sitting Pulse- Sitting BP- Standing at 0 minutes Pulse- Standing at 0 minutes  11/04/20 1731 121/70 86 115/63 88 112/61 100     Physical Exam Vitals and nursing note reviewed.  HENT:     Head: Normocephalic and atraumatic.  Eyes:     General: No scleral icterus.    Extraocular Movements: Extraocular movements intact.  Pulmonary:     Effort: Pulmonary effort is normal. No respiratory distress.  Abdominal:     Palpations: Abdomen is soft.     Tenderness: There is no abdominal tenderness.  Musculoskeletal:     Cervical back: Normal range of motion. No rigidity or tenderness.  Skin:    General: Skin is warm and dry.  Neurological:     General: No focal deficit present.      Mental Status: She is alert.     Cranial Nerves: No cranial nerve deficit.     MAU Course  Procedures   Results for orders placed or performed during the hospital encounter of 11/04/20 (from the past 24 hour(s))  Urinalysis, Routine w reflex microscopic Urine, Clean Catch     Status: Abnormal   Collection Time: 11/04/20  5:11 PM  Result Value Ref Range   Color, Urine YELLOW YELLOW   APPearance HAZY (A) CLEAR   Specific Gravity, Urine 1.006 1.005 - 1.030   pH 7.0 5.0 - 8.0   Glucose, UA NEGATIVE NEGATIVE mg/dL   Hgb urine dipstick NEGATIVE NEGATIVE   Bilirubin Urine NEGATIVE NEGATIVE   Ketones, ur 5 (A) NEGATIVE mg/dL   Protein, ur NEGATIVE NEGATIVE mg/dL   Nitrite NEGATIVE NEGATIVE   Leukocytes,Ua MODERATE (A) NEGATIVE   RBC / HPF 0-5 0 - 5 RBC/hpf   WBC, UA 6-10 0 - 5 WBC/hpf   Bacteria, UA MANY (A) NONE SEEN   Squamous Epithelial / LPF 6-10 0 - 5   Mucus PRESENT   Hemoglobin and hematocrit, blood     Status: Abnormal   Collection Time: 11/04/20  5:25 PM  Result Value Ref Range   Hemoglobin 9.0 (L) 12.0 - 15.0 g/dL   HCT 27.3 (L) 36.0 - 46.0 %  Glucose, capillary     Status: None   Collection Time: 11/04/20  5:30 PM  Result Value Ref Range   Glucose-Capillary 85 70 - 99 mg/dL   CT HEAD WO CONTRAST  Result Date: 11/04/2020 CLINICAL DATA:  Syncope EXAM: CT HEAD WITHOUT CONTRAST TECHNIQUE: Contiguous axial images were obtained from the base of the skull through the vertex without intravenous contrast. COMPARISON:  09/09/2014 FINDINGS: Brain: No acute intracranial abnormality. Specifically, no hemorrhage, hydrocephalus, mass lesion, acute infarction, or significant intracranial injury. Vascular: No hyperdense vessel or unexpected calcification. Skull: No acute calvarial abnormality. Sinuses/Orbits: No acute findings Other: None IMPRESSION: Normal study. Electronically Signed   By: Rolm Baptise M.D.   On: 11/04/2020 19:08    MDM FHT present via doppler Normal EKG, reviewed by  Dr. Dione Plover Patient is anemic, hemoglobin today is per her baseline. She was started on oral iron supplements yesterday.  Head Ct performed due to head trauma during syncopal event; normal CT Patient states she only had a slushy today; suspect syncope due to dehydration or hypoglycemic event.   Tylenol 1 gm PO given in MAU to treat headache   Assessment and Plan   1. Syncope, unspecified syncope type   2. [redacted]  weeks gestation of pregnancy    -encouraged to increase water intake during the day & eat something every 2-3 hours -reviewed reasons to return to MAU -continue iron supplement  Jorje Guild 11/04/2020, 7:53 PM

## 2020-11-04 NOTE — ED Provider Notes (Signed)
Emergency Medicine Provider OB Triage Evaluation Note  Heather Noble is a 25 y.o. female, G2P1001, at [redacted]w[redacted]d gestation who presents to the emergency department with complaints of lower abdominal pain.  States that she had a syncopal episode just prior to arrival while at work.  She had preceding dizziness and lightheadedness.  States that she ate a normal meal today.  Denies any vomiting.  States that her biggest complaint now is that she is having/pelvic pain.  Denies any vaginal bleeding.  Had an ultrasound for this pregnancy recently and was told that she had a single IUP.  Denies any chest pain, headache, vision change.  Review of  Systems  Positive: Abdominal/pelvic pain, syncope Negative: Chest pain, headache, vision changes  Physical Exam  BP 136/84 (BP Location: Right Arm)   Pulse 92   Temp 98.5 F (36.9 C) (Oral)   Resp 16   LMP 07/21/2020   SpO2 99%  General: Awake, no distress  HEENT: Atraumatic  Resp: Normal effort  Cardiac: Normal rate Abd: Nondistended, nontender  MSK: Moves all extremities without difficulty Neuro: Speech clear, strength 5/5 in bilateral upper and lower extremities, no facial asymmetry  Medical Decision Making  Pt evaluated for pregnancy concern and is stable for transfer to MAU. Pt is in agreement with plan for transfer.  4:37 PM Discussed with MAU APP, Denny Peon, who accepts patient in transfer.  Clinical Impression  No diagnosis found.     Dietrich Pates, PA-C 11/04/20 1639    Gwyneth Sprout, MD 11/05/20 [id number]

## 2020-11-04 NOTE — ED Triage Notes (Signed)
Per ems, pt coming from work. Pt report's having a syncopal episode, unknown down time. Pt fell backward onto a rubber mat. Denies any injuries but reporting LLQ abd cramping. Pt is [redacted] weeks pregnant. Reports no PO fluids today. G2P1.

## 2020-11-06 LAB — CULTURE, OB URINE: Special Requests: NORMAL

## 2020-11-09 ENCOUNTER — Encounter: Payer: Self-pay | Admitting: Obstetrics

## 2020-11-09 ENCOUNTER — Encounter: Payer: Self-pay | Admitting: Women's Health

## 2020-11-15 ENCOUNTER — Encounter: Payer: Self-pay | Admitting: Women's Health

## 2020-11-29 ENCOUNTER — Encounter: Payer: Medicaid Other | Admitting: Obstetrics and Gynecology

## 2020-12-01 ENCOUNTER — Encounter: Payer: Medicaid Other | Admitting: Family Medicine

## 2020-12-08 ENCOUNTER — Ambulatory Visit (INDEPENDENT_AMBULATORY_CARE_PROVIDER_SITE_OTHER): Payer: Medicaid Other | Admitting: Obstetrics

## 2020-12-08 ENCOUNTER — Encounter: Payer: Self-pay | Admitting: Obstetrics

## 2020-12-08 ENCOUNTER — Other Ambulatory Visit: Payer: Self-pay

## 2020-12-08 VITALS — BP 124/81 | HR 65 | Wt 202.6 lb

## 2020-12-08 DIAGNOSIS — O219 Vomiting of pregnancy, unspecified: Secondary | ICD-10-CM

## 2020-12-08 DIAGNOSIS — Z3480 Encounter for supervision of other normal pregnancy, unspecified trimester: Secondary | ICD-10-CM

## 2020-12-08 MED ORDER — DOXYLAMINE-PYRIDOXINE 10-10 MG PO TBEC: DELAYED_RELEASE_TABLET | ORAL | 5 refills | Status: DC

## 2020-12-08 NOTE — Progress Notes (Signed)
Pt presents for ROB, rpt CBC, and AFP only.  Pt requests meds for nausea. Detailed anatomy scheduled on Friday.

## 2020-12-08 NOTE — Progress Notes (Signed)
Subjective:  Heather Noble is a 25 y.o. G2P1001 at [redacted]w[redacted]d being seen today for ongoing prenatal care.  She is currently monitored for the following issues for this low-risk pregnancy and has Migraine with aura and without status migrainosus, not intractable; S/P cesarean section; Sickle cell trait (HCC); Rubella non-immune status, antepartum; Anemia in pregnancy; History of gestational hypertension; and Encounter for supervision of normal pregnancy, antepartum on their problem list.  Patient reports backache, heartburn, and nausea.  Contractions: Not present. Vag. Bleeding: None.  Movement: Present. Denies leaking of fluid.   The following portions of the patient's history were reviewed and updated as appropriate: allergies, current medications, past family history, past medical history, past social history, past surgical history and problem list. Problem list updated.  Objective:   Vitals:   12/08/20 1121  BP: 124/81  Pulse: 65  Weight: 202 lb 9.6 oz (91.9 kg)    Fetal Status:     Movement: Present     General:  Alert, oriented and cooperative. Patient is in no acute distress.  Skin: Skin is warm and dry. No rash noted.   Cardiovascular: Normal heart rate noted  Respiratory: Normal respiratory effort, no problems with respiration noted  Abdomen: Soft, gravid, appropriate for gestational age. Pain/Pressure: Absent     Pelvic:  Cervical exam deferred        Extremities: Normal range of motion.  Edema: None  Mental Status: Normal mood and affect. Normal behavior. Normal judgment and thought content.   Urinalysis:      Assessment and Plan:  Pregnancy: G2P1001 at [redacted]w[redacted]d  1. Supervision of other normal pregnancy, antepartum Rx: - CBC - AFP, Serum, Open Spina Bifida  2. Nausea and vomiting in pregnancy Rx: - Doxylamine-Pyridoxine (DICLEGIS) 10-10 MG TBEC; 1 tab in AM, 1 tab mid afternoon 2 tabs at bedtime. Max dose 4 tabs daily.  Dispense: 100 tablet; Refill: 5   Preterm labor  symptoms and general obstetric precautions including but not limited to vaginal bleeding, contractions, leaking of fluid and fetal movement were reviewed in detail with the patient. Please refer to After Visit Summary for other counseling recommendations.   Return in about 4 weeks (around 01/05/2021) for ROB.   Brock Bad, MD  12/08/20

## 2020-12-10 ENCOUNTER — Encounter: Payer: Self-pay | Admitting: *Deleted

## 2020-12-10 ENCOUNTER — Other Ambulatory Visit: Payer: Self-pay | Admitting: *Deleted

## 2020-12-10 ENCOUNTER — Ambulatory Visit: Payer: BC Managed Care – PPO | Admitting: *Deleted

## 2020-12-10 ENCOUNTER — Other Ambulatory Visit: Payer: Self-pay

## 2020-12-10 ENCOUNTER — Ambulatory Visit: Payer: BC Managed Care – PPO | Attending: Women's Health

## 2020-12-10 ENCOUNTER — Other Ambulatory Visit: Payer: Self-pay | Admitting: Obstetrics

## 2020-12-10 VITALS — BP 121/71 | HR 78

## 2020-12-10 DIAGNOSIS — Z3481 Encounter for supervision of other normal pregnancy, first trimester: Secondary | ICD-10-CM | POA: Diagnosis present

## 2020-12-10 DIAGNOSIS — Z8759 Personal history of other complications of pregnancy, childbirth and the puerperium: Secondary | ICD-10-CM | POA: Diagnosis present

## 2020-12-10 DIAGNOSIS — Z362 Encounter for other antenatal screening follow-up: Secondary | ICD-10-CM

## 2020-12-10 LAB — AFP, SERUM, OPEN SPINA BIFIDA
AFP MoM: 0.87
AFP Value: 39.4 ng/mL
Gest. Age on Collection Date: 18.6 weeks
Maternal Age At EDD: 26 yr
OSBR Risk 1 IN: 10000
Test Results:: NEGATIVE
Weight: 202 [lb_av]

## 2020-12-10 LAB — CBC
Hematocrit: 25 % — ABNORMAL LOW (ref 34.0–46.6)
Hemoglobin: 8.4 g/dL — ABNORMAL LOW (ref 11.1–15.9)
MCH: 30 pg (ref 26.6–33.0)
MCHC: 33.6 g/dL (ref 31.5–35.7)
MCV: 89 fL (ref 79–97)
Platelets: 359 10*3/uL (ref 150–450)
RBC: 2.8 x10E6/uL — ABNORMAL LOW (ref 3.77–5.28)
RDW: 11.4 % — ABNORMAL LOW (ref 11.7–15.4)
WBC: 6.6 10*3/uL (ref 3.4–10.8)

## 2020-12-13 ENCOUNTER — Telehealth: Payer: Self-pay

## 2020-12-13 NOTE — Telephone Encounter (Signed)
-----   Message from Brock Bad, MD sent at 12/10/2020  8:40 AM EDT ----- Iron Rx for anemia

## 2020-12-13 NOTE — Telephone Encounter (Signed)
-----   Message from Charles A Harper, MD sent at 12/10/2020  8:40 AM EDT ----- Iron Rx for anemia 

## 2020-12-13 NOTE — Telephone Encounter (Signed)
Call patient ,no answer

## 2020-12-13 NOTE — Telephone Encounter (Signed)
Patient reviewed test results online. 

## 2020-12-13 NOTE — Telephone Encounter (Signed)
-----   Message from Brock Bad, MD sent at 12/13/2020  3:01 PM EDT ----- Please offer Iron transfusion for severe anemia, and let me know if she agrees. She should be taking oral iron.

## 2020-12-13 NOTE — Telephone Encounter (Signed)
Call patient to inform her of test results and recommendation for Iron Infusion. No answer or voice mail to leave a message.

## 2020-12-13 NOTE — Telephone Encounter (Signed)
Patient reviewed test results via-my chart 

## 2021-01-05 ENCOUNTER — Encounter: Payer: Medicaid Other | Admitting: Women's Health

## 2021-01-11 ENCOUNTER — Telehealth: Payer: Self-pay

## 2021-01-11 NOTE — Telephone Encounter (Signed)
RTC to pt regarding message pt requesting note to be out of work due to fainting etc. Pt was last seen in office 12/08/20 and last hospital visit was 11/04/20 pt made aware she missed recent appt and needs to R/S appt and have evaluation with provider and discuss her request since her last office note does not mention this etc.  Pt made aware she made need referral etc to see what may be causing sx's so it is important she gets seen  Pt agreeable and voiced understanding

## 2021-01-12 ENCOUNTER — Ambulatory Visit: Payer: BC Managed Care – PPO | Admitting: *Deleted

## 2021-01-12 ENCOUNTER — Other Ambulatory Visit: Payer: Self-pay | Admitting: *Deleted

## 2021-01-12 ENCOUNTER — Other Ambulatory Visit: Payer: Self-pay

## 2021-01-12 ENCOUNTER — Ambulatory Visit: Payer: BC Managed Care – PPO | Attending: Obstetrics and Gynecology

## 2021-01-12 ENCOUNTER — Encounter: Payer: Self-pay | Admitting: *Deleted

## 2021-01-12 VITALS — BP 121/71 | HR 88

## 2021-01-12 DIAGNOSIS — Z362 Encounter for other antenatal screening follow-up: Secondary | ICD-10-CM | POA: Diagnosis present

## 2021-01-12 DIAGNOSIS — Z8759 Personal history of other complications of pregnancy, childbirth and the puerperium: Secondary | ICD-10-CM | POA: Insufficient documentation

## 2021-01-14 ENCOUNTER — Ambulatory Visit (INDEPENDENT_AMBULATORY_CARE_PROVIDER_SITE_OTHER): Payer: BC Managed Care – PPO | Admitting: Obstetrics and Gynecology

## 2021-01-14 ENCOUNTER — Other Ambulatory Visit: Payer: Self-pay

## 2021-01-14 ENCOUNTER — Encounter (HOSPITAL_COMMUNITY): Payer: Self-pay | Admitting: Obstetrics & Gynecology

## 2021-01-14 ENCOUNTER — Inpatient Hospital Stay (HOSPITAL_COMMUNITY)
Admission: AD | Admit: 2021-01-14 | Discharge: 2021-01-14 | Disposition: A | Discharge status: Home / Self Care | Payer: BC Managed Care – PPO | Attending: Obstetrics & Gynecology | Admitting: Obstetrics & Gynecology

## 2021-01-14 VITALS — BP 129/84 | HR 106 | Wt 203.0 lb

## 2021-01-14 DIAGNOSIS — Z8759 Personal history of other complications of pregnancy, childbirth and the puerperium: Secondary | ICD-10-CM

## 2021-01-14 DIAGNOSIS — Z3A24 24 weeks gestation of pregnancy: Secondary | ICD-10-CM

## 2021-01-14 DIAGNOSIS — O99342 Other mental disorders complicating pregnancy, second trimester: Secondary | ICD-10-CM

## 2021-01-14 DIAGNOSIS — O99612 Diseases of the digestive system complicating pregnancy, second trimester: Secondary | ICD-10-CM | POA: Insufficient documentation

## 2021-01-14 DIAGNOSIS — Z3480 Encounter for supervision of other normal pregnancy, unspecified trimester: Secondary | ICD-10-CM

## 2021-01-14 DIAGNOSIS — Z98891 History of uterine scar from previous surgery: Secondary | ICD-10-CM

## 2021-01-14 DIAGNOSIS — D509 Iron deficiency anemia, unspecified: Secondary | ICD-10-CM | POA: Diagnosis not present

## 2021-01-14 DIAGNOSIS — K219 Gastro-esophageal reflux disease without esophagitis: Secondary | ICD-10-CM | POA: Insufficient documentation

## 2021-01-14 DIAGNOSIS — O99012 Anemia complicating pregnancy, second trimester: Secondary | ICD-10-CM | POA: Insufficient documentation

## 2021-01-14 DIAGNOSIS — R42 Dizziness and giddiness: Secondary | ICD-10-CM | POA: Diagnosis present

## 2021-01-14 DIAGNOSIS — F5089 Other specified eating disorder: Secondary | ICD-10-CM | POA: Insufficient documentation

## 2021-01-14 DIAGNOSIS — O212 Late vomiting of pregnancy: Secondary | ICD-10-CM | POA: Insufficient documentation

## 2021-01-14 DIAGNOSIS — O219 Vomiting of pregnancy, unspecified: Secondary | ICD-10-CM

## 2021-01-14 DIAGNOSIS — O09292 Supervision of pregnancy with other poor reproductive or obstetric history, second trimester: Secondary | ICD-10-CM

## 2021-01-14 LAB — CBC
HCT: 26.1 % — ABNORMAL LOW (ref 36.0–46.0)
Hemoglobin: 8.7 g/dL — ABNORMAL LOW (ref 12.0–15.0)
MCH: 30.5 pg (ref 26.0–34.0)
MCHC: 33.3 g/dL (ref 30.0–36.0)
MCV: 91.6 fL (ref 80.0–100.0)
Platelets: 355 10*3/uL (ref 150–400)
RBC: 2.85 MIL/uL — ABNORMAL LOW (ref 3.87–5.11)
RDW: 11.7 % (ref 11.5–15.5)
WBC: 7.7 10*3/uL (ref 4.0–10.5)
nRBC: 0 % (ref 0.0–0.2)

## 2021-01-14 LAB — URINALYSIS, ROUTINE W REFLEX MICROSCOPIC
Bilirubin Urine: NEGATIVE
Glucose, UA: NEGATIVE mg/dL
Hgb urine dipstick: NEGATIVE
Ketones, ur: NEGATIVE mg/dL
Nitrite: NEGATIVE
Protein, ur: NEGATIVE mg/dL
Specific Gravity, Urine: 1.012 (ref 1.005–1.030)
pH: 6 (ref 5.0–8.0)

## 2021-01-14 MED ORDER — SODIUM CHLORIDE 0.9 % IV SOLN
510.0000 mg | Freq: Once | INTRAVENOUS | Status: AC
  Administered 2021-01-14: 510 mg via INTRAVENOUS
  Filled 2021-01-14: qty 17

## 2021-01-14 MED ORDER — ONDANSETRON 4 MG PO TBDP: 4.0000 mg | ORAL_TABLET | Freq: Three times a day (TID) | ORAL | 1 refills | Status: DC | PRN

## 2021-01-14 MED ORDER — SODIUM CHLORIDE 0.9 % IV SOLN: INTRAVENOUS | Status: DC

## 2021-01-14 MED ORDER — FAMOTIDINE 20 MG PO TABS: 20.0000 mg | ORAL_TABLET | Freq: Every day | ORAL | 1 refills | Status: DC

## 2021-01-14 NOTE — MAU Provider Note (Signed)
History     CSN: [id number]  Arrival date and time: 01/14/21 1305   Event Date/Time   First Provider Initiated Contact with Patient 01/14/21 1420      Chief Complaint  Patient presents with   Anemia   Dizziness   25 y.o. G2P1001 _0 .1 wks presenting from office for potential Fe infusion. Pt reports dizziness while she is at work most days. 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 She reports needing to go sit down at work and as a consequence has accrued points and was suspended from work. Reports presenting work notes to her employer but they haven't made accommodations for her. Denies recent syncope but reports passing out once about 2 mos ago while at work. Denies CP but reports SOB with increased physical activity such as climbing stairs. Also admits to craving and eating ice regularly to help with her heartburn and nausea. She is taking po Fe EOD. Reports +FM. No pregnancy complaints.   OB History     Gravida  2   Para  1   Term  1   Preterm  0   AB  0   Living  1      SAB  0   IAB  0   Ectopic  0   Multiple  0   Live Births  1           Past Medical History:  Diagnosis Date   Headache    Pregnancy induced hypertension     Past Surgical History:  Procedure Laterality Date   CESAREAN SECTION N/A 04/21/2017   Procedure: CESAREAN SECTION;  Surgeon: Osborne Oman, MD;  Location: [place] [facility];  Service: Obstetrics;  Laterality: N/A;   WISDOM TOOTH EXTRACTION  2015    Family History  Problem Relation Age of Onset   Hypertension Maternal Grandmother    Osteoarthritis Maternal Grandmother    Gout Maternal Grandmother    Thyroid disease Maternal Grandmother    Diabetes Maternal Grandmother    Cancer Paternal Grandfather        unknown    Social History   Tobacco Use   Smoking status: Never   Smokeless tobacco: Never  Vaping Use   Vaping Use: Never used  Substance  Use Topics   Alcohol use: No    Comment: occ   Drug use: No    Allergies: No Known Allergies  No medications prior to admission.    Review of Systems  Respiratory:  Positive for shortness of breath.   Cardiovascular:  Negative for chest pain.  Gastrointestinal:  Negative for abdominal pain.  Genitourinary:  Negative for vaginal bleeding.  Neurological:  Negative for headaches.  Physical Exam   Blood pressure 131/70, pulse 78, temperature 98.7 F (37.1 C), temperature source Oral, resp. rate 15, last menstrual period 07/21/2020, SpO2 100 %.  Physical Exam Vitals and nursing note reviewed.  Constitutional:      General: She is not in acute distress.    Appearance: Normal appearance.  HENT:     Head: Normocephalic and atraumatic.  Cardiovascular:     Rate and Rhythm: Normal rate and regular rhythm.     Heart sounds: Normal heart sounds.  Pulmonary:     Effort: Pulmonary effort is normal. No respiratory distress.     Breath sounds: Normal breath sounds. No stridor. No wheezing, rhonchi or rales.  Musculoskeletal:  General: Normal range of motion.     Cervical back: Normal range of motion.  Skin:    General: Skin is warm and dry.  Neurological:     General: No focal deficit present.     Mental Status: She is alert and oriented to person, place, and time.  Psychiatric:        Mood and Affect: Mood normal.        Behavior: Behavior normal.  EFM: 130 bpm, mod variability, + accels, no decels Toco: none  Results for orders placed or performed during the hospital encounter of 01/14/21 (from the past 24 hour(s))  CBC     Status: Abnormal   Collection Time: 01/14/21  1:19 PM  Result Value Ref Range   WBC 7.7 4.0 - 10.5 K/uL   RBC 2.85 (L) 3.87 - 5.11 MIL/uL   Hemoglobin 8.7 (L) 12.0 - 15.0 g/dL   HCT 26.1 (L) 36.0 - 46.0 %   MCV 91.6 80.0 - 100.0 fL   MCH 30.5 26.0 - 34.0 pg   MCHC 33.3 30.0 - 36.0 g/dL   RDW 11.7 11.5 - 15.5 %   Platelets 355 150 - 400 K/uL    nRBC 0.0 0.0 - 0.2 %  Urinalysis, Routine w reflex microscopic Urine, Clean Catch     Status: Abnormal   Collection Time: 01/14/21  2:19 PM  Result Value Ref Range   Color, Urine YELLOW YELLOW   APPearance HAZY (A) CLEAR   Specific Gravity, Urine 1.012 1.005 - 1.030   pH 6.0 5.0 - 8.0   Glucose, UA NEGATIVE NEGATIVE mg/dL   Hgb urine dipstick NEGATIVE NEGATIVE   Bilirubin Urine NEGATIVE NEGATIVE   Ketones, ur NEGATIVE NEGATIVE mg/dL   Protein, ur NEGATIVE NEGATIVE mg/dL   Nitrite NEGATIVE NEGATIVE   Leukocytes,Ua MODERATE (A) NEGATIVE   RBC / HPF 0-5 0 - 5 RBC/hpf   WBC, UA 6-10 0 - 5 WBC/hpf   Bacteria, UA RARE (A) NONE SEEN   Squamous Epithelial / LPF 21-50 0 - 5   Mucus PRESENT    MAU Course  Procedures Feraheme  MDM Labs ordered and reviewed. Not orthostatic. Will treat GERD with Pepcid and nausea with Zofran to help reduce ice eating (discussed PICA). Stable for discharge home.  Assessment and Plan   1. Supervision of other normal pregnancy, antepartum   2. History of gestational hypertension   3. [redacted] weeks gestation of pregnancy   4. Iron deficiency anemia, unspecified iron deficiency anemia type   5. Gastroesophageal reflux disease without esophagitis   6. Nausea and vomiting during pregnancy   7. Pica    Discharge home Follow up at [facility] as scheduled Rx Pepcid Rx Zofran Continue po Fe Increase Fe rich foods  Allergies as of 01/14/2021   No Known Allergies      Medication List     STOP taking these medications    Doxylamine-Pyridoxine 10-10 MG Tbec Commonly known as: Diclegis       TAKE these medications    aspirin EC 81 MG tablet Take 1 tablet (81 mg total) by mouth daily. Swallow whole.   Blood Pressure Kit Devi 1 kit by Does not apply route once a week.   [facility] Prenatal Gummy 0.4-25 MG Chew Chew 3 tablets by mouth daily.   famotidine 20 MG tablet Commonly known as: PEPCID Take 1 tablet (20 mg total) by mouth at bedtime.   ferrous  sulfate 324 (65 Fe) MG Tbec Take 1 tablet (325 mg  total) by mouth every other day.   ondansetron 4 MG disintegrating tablet Commonly known as: Zofran ODT Take 1 tablet (4 mg total) by mouth every 8 (eight) hours as needed for nausea or vomiting.        Julianne Handler, CNM 01/14/2021, 4:58 PM

## 2021-01-14 NOTE — MAU Note (Signed)
.  Heather Noble is a 25 y.o. at [redacted]w[redacted]d here in MAU reporting: sent from office for severe anemia and feeling lightheaded. She states she passed out in the beginning of July but was not seen in MAU then. Denies VB or LOF. Endorses good fetal movement. Taking her iron tablets every other day. Denies pain.  Pain score: 0 Vitals:   01/14/21 1405  BP: 127/72  Pulse: 98  Resp: 17  Temp: 98.7 F (37.1 C)  SpO2: 99%     FHT:141 Lab orders placed from triage:  UA

## 2021-01-14 NOTE — Progress Notes (Signed)
Orthostatic BP readings are recorded in 4 separate entries on vital signs tab. They reflect lying, sitting, standing, and standing for 3 minutes respectively.

## 2021-01-14 NOTE — Progress Notes (Signed)
   PRENATAL VISIT NOTE  Subjective:  Heather Noble is a 25 y.o. G2P1001 at [redacted]w[redacted]d being seen today for ongoing prenatal care.  She is currently monitored for the following issues for this low-risk pregnancy and has Migraine with aura and without status migrainosus, not intractable; S/P cesarean section; Sickle cell trait (HCC); Rubella non-immune status, antepartum; Anemia in pregnancy; History of gestational hypertension; and Encounter for supervision of normal pregnancy, antepartum on their problem list.  Patient reports no complaints.  Contractions: Not present. Vag. Bleeding: None.  Movement: Present. Denies leaking of fluid.  Missing work and suspended from work 2/2 to dizziness, and the frequent need to sit do to her dizziness.   The following portions of the patient's history were reviewed and updated as appropriate: allergies, current medications, past family history, past medical history, past social history, past surgical history and problem list.   Objective:   Vitals:   01/14/21 1050 01/14/21 1120 01/14/21 1121  BP: 119/76 118/80 128/81  Pulse: 89 90 89  Weight: 203 lb (92.1 kg)      Fetal Status: Fetal Heart Rate (bpm): 140 Fundal Height: 24 cm Movement: Present     General:  Alert, oriented and cooperative. Patient is in no acute distress.  Skin: Skin is warm and dry. No rash noted.   Cardiovascular: Normal heart rate noted  Respiratory: Normal respiratory effort, no problems with respiration noted  Abdomen: Soft, gravid, appropriate for gestational age.  Pain/Pressure: Absent     Pelvic: Cervical exam deferred        Extremities: Normal range of motion.     Mental Status: Normal mood and affect. Normal behavior. Normal judgment and thought content.   Assessment and Plan:  Pregnancy: G2P1001 at [redacted]w[redacted]d  1. Anemia during pregnancy in second trimester   - Last hemoglobin was 8.4 on 7/6:  patient was offered and declined Iron transfusion. She has been taking Iron every  other day.  Reviewed patient with Dr. Macon Large, who recommended patient go to MAU for stat CBC, EKG and workup.  MAU notified Address provided to patient.   2. Supervision of other normal pregnancy, antepartum  Lying 118/80  HR 90 Sitting 128/81  HR 89 Standing 125/83  HR 98  Standing @ 3 minuets 129/84  HR 106  3. S/P cesarean section  MD visit to discuss VBAC and sign consent.   Preterm labor symptoms and general obstetric precautions including but not limited to vaginal bleeding, contractions, leaking of fluid and fetal movement were reviewed in detail with the patient. Please refer to After Visit Summary for other counseling recommendations.   Return in about 4 weeks (around 02/11/2021), or for 2 hour GTT, please schedule with MD to discuss VBAC and consent.  Future Appointments  Date Time Provider Department Center  02/08/2021 10:30 AM [facility] NURSE [facility]  02/08/2021 10:45 AM [facility] US4 [facility]    Venia Carbon, NP

## 2021-02-08 ENCOUNTER — Ambulatory Visit: Payer: Medicaid Other | Attending: Obstetrics and Gynecology

## 2021-02-08 ENCOUNTER — Ambulatory Visit: Payer: Medicaid Other

## 2021-02-11 ENCOUNTER — Other Ambulatory Visit: Payer: BC Managed Care – PPO

## 2021-02-14 ENCOUNTER — Encounter: Payer: Self-pay | Admitting: Obstetrics and Gynecology

## 2021-02-14 ENCOUNTER — Other Ambulatory Visit: Payer: BC Managed Care – PPO

## 2021-02-14 ENCOUNTER — Other Ambulatory Visit: Payer: Self-pay

## 2021-02-14 ENCOUNTER — Ambulatory Visit (INDEPENDENT_AMBULATORY_CARE_PROVIDER_SITE_OTHER): Payer: Medicaid Other | Admitting: Obstetrics and Gynecology

## 2021-02-14 VITALS — BP 123/81 | HR 82 | Wt 208.0 lb

## 2021-02-14 DIAGNOSIS — Z98891 History of uterine scar from previous surgery: Secondary | ICD-10-CM

## 2021-02-14 DIAGNOSIS — Z8759 Personal history of other complications of pregnancy, childbirth and the puerperium: Secondary | ICD-10-CM

## 2021-02-14 DIAGNOSIS — O99013 Anemia complicating pregnancy, third trimester: Secondary | ICD-10-CM

## 2021-02-14 DIAGNOSIS — Z3480 Encounter for supervision of other normal pregnancy, unspecified trimester: Secondary | ICD-10-CM

## 2021-02-14 DIAGNOSIS — Z2839 Other underimmunization status: Secondary | ICD-10-CM

## 2021-02-14 NOTE — Progress Notes (Signed)
ROB, will need to reschedule 2 Hr. GTT. Declined TDAP and FLU vaccines. VBAC signed

## 2021-02-14 NOTE — Progress Notes (Signed)
   PRENATAL VISIT NOTE  Subjective:  Heather Noble is a 25 y.o. G2P1001 at [redacted]w[redacted]d being seen today for ongoing prenatal care.  She is currently monitored for the following issues for this low-risk pregnancy and has Migraine with aura and without status migrainosus, not intractable; S/P cesarean section; Sickle cell trait (HCC); Rubella non-immune status, antepartum; Anemia in pregnancy; History of gestational hypertension; and Encounter for supervision of normal pregnancy, antepartum on their problem list.  Patient reports no complaints.   . Vag. Bleeding: None.  Movement: Present. Denies leaking of fluid.   The following portions of the patient's history were reviewed and updated as appropriate: allergies, current medications, past family history, past medical history, past social history, past surgical history and problem list.   Objective:   Vitals:   02/14/21 0958  BP: 123/81  Pulse: 82  Weight: 208 lb (94.3 kg)    Fetal Status: Fetal Heart Rate (bpm): 140 Fundal Height: 28 cm Movement: Present     General:  Alert, oriented and cooperative. Patient is in no acute distress.  Skin: Skin is warm and dry. No rash noted.   Cardiovascular: Normal heart rate noted  Respiratory: Normal respiratory effort, no problems with respiration noted  Abdomen: Soft, gravid, appropriate for gestational age.  Pain/Pressure: Absent     Pelvic: Cervical exam deferred        Extremities: Normal range of motion.  Edema: None  Mental Status: Normal mood and affect. Normal behavior. Normal judgment and thought content.   Assessment and Plan:  Pregnancy: G2P1001 at [redacted]w[redacted]d 1. Supervision of other normal pregnancy, antepartum Patient is doing well without complaints Third trimester labs and glucola later this week Patient does not want contraception  2. Anemia during pregnancy in third trimester Patient s/p iron transfusion  Continue iron supplement Patient reports feeling much better and is  asymptomatic  3. S/P cesarean section Desires TOLAC Consent signed  4. History of gestational hypertension Stable  5. Rubella non-immune status, antepartum Will offer pp  Preterm labor symptoms and general obstetric precautions including but not limited to vaginal bleeding, contractions, leaking of fluid and fetal movement were reviewed in detail with the patient. Please refer to After Visit Summary for other counseling recommendations.   Return in about 2 weeks (around 02/28/2021) for in person, ROB, Low risk.  No future appointments.  Catalina Antigua, MD

## 2021-02-15 ENCOUNTER — Other Ambulatory Visit: Payer: Medicaid Other

## 2021-02-15 DIAGNOSIS — Z3480 Encounter for supervision of other normal pregnancy, unspecified trimester: Secondary | ICD-10-CM

## 2021-02-16 ENCOUNTER — Other Ambulatory Visit: Payer: Self-pay | Admitting: Certified Nurse Midwife

## 2021-02-16 LAB — CBC
Hematocrit: 27.7 % — ABNORMAL LOW (ref 34.0–46.6)
Hemoglobin: 8.9 g/dL — ABNORMAL LOW (ref 11.1–15.9)
MCH: 30.1 pg (ref 26.6–33.0)
MCHC: 32.1 g/dL (ref 31.5–35.7)
MCV: 94 fL (ref 79–97)
Platelets: 298 10*3/uL (ref 150–450)
RBC: 2.96 x10E6/uL — ABNORMAL LOW (ref 3.77–5.28)
RDW: 12.8 % (ref 11.7–15.4)
WBC: 7.6 10*3/uL (ref 3.4–10.8)

## 2021-02-16 LAB — GLUCOSE TOLERANCE, 2 HOURS W/ 1HR
Glucose, 1 hour: 97 mg/dL (ref 65–179)
Glucose, 2 hour: 94 mg/dL (ref 65–152)
Glucose, Fasting: 79 mg/dL (ref 65–91)

## 2021-02-16 LAB — HIV ANTIBODY (ROUTINE TESTING W REFLEX): HIV Screen 4th Generation wRfx: NONREACTIVE

## 2021-02-16 LAB — RPR: RPR Ser Ql: NONREACTIVE

## 2021-03-01 ENCOUNTER — Encounter: Payer: Self-pay | Admitting: Advanced Practice Midwife

## 2021-03-01 ENCOUNTER — Other Ambulatory Visit: Payer: Self-pay

## 2021-03-01 ENCOUNTER — Ambulatory Visit (INDEPENDENT_AMBULATORY_CARE_PROVIDER_SITE_OTHER): Payer: Medicaid Other | Admitting: Advanced Practice Midwife

## 2021-03-01 VITALS — BP 125/86 | HR 91 | Wt 216.0 lb

## 2021-03-01 DIAGNOSIS — O99891 Other specified diseases and conditions complicating pregnancy: Secondary | ICD-10-CM

## 2021-03-01 DIAGNOSIS — M549 Dorsalgia, unspecified: Secondary | ICD-10-CM

## 2021-03-01 DIAGNOSIS — Z3A30 30 weeks gestation of pregnancy: Secondary | ICD-10-CM

## 2021-03-01 DIAGNOSIS — Z98891 History of uterine scar from previous surgery: Secondary | ICD-10-CM

## 2021-03-01 DIAGNOSIS — O99013 Anemia complicating pregnancy, third trimester: Secondary | ICD-10-CM

## 2021-03-01 DIAGNOSIS — Z3480 Encounter for supervision of other normal pregnancy, unspecified trimester: Secondary | ICD-10-CM

## 2021-03-01 NOTE — Progress Notes (Signed)
ROB [redacted]w[redacted]d  CC: None 

## 2021-03-01 NOTE — Progress Notes (Signed)
   PRENATAL VISIT NOTE  Subjective:  Heather Noble is a 25 y.o. G2P1001 at [redacted]w[redacted]d being seen today for ongoing prenatal care.  She is currently monitored for the following issues for this low-risk pregnancy and has Migraine with aura and without status migrainosus, not intractable; S/P cesarean section; Sickle cell trait (HCC); Rubella non-immune status, antepartum; Anemia in pregnancy; History of gestational hypertension; and Encounter for supervision of normal pregnancy, antepartum on their problem list.  Patient reports backache.  Contractions: Not present. Vag. Bleeding: None.  Movement: Present. Denies leaking of fluid.   The following portions of the patient's history were reviewed and updated as appropriate: allergies, current medications, past family history, past medical history, past social history, past surgical history and problem list.   Objective:   Vitals:   03/01/21 1346  BP: 125/86  Pulse: 91  Weight: 216 lb (98 kg)    Fetal Status: Fetal Heart Rate (bpm): 138 Fundal Height: 30 cm Movement: Present     General:  Alert, oriented and cooperative. Patient is in no acute distress.  Skin: Skin is warm and dry. No rash noted.   Cardiovascular: Normal heart rate noted  Respiratory: Normal respiratory effort, no problems with respiration noted  Abdomen: Soft, gravid, appropriate for gestational age.  Pain/Pressure: Absent     Pelvic: Cervical exam deferred        Extremities: Normal range of motion.  Edema: None  Mental Status: Normal mood and affect. Normal behavior. Normal judgment and thought content.   Assessment and Plan:  Pregnancy: G2P1001 at [redacted]w[redacted]d 1. Supervision of other normal pregnancy, antepartum --Anticipatory guidance about next visits/weeks of pregnancy given. --Next visit in 2 weeks  2. Anemia during pregnancy in third trimester --Severe. Declines infusion, asymptomatic.  3. History of cesarean section --Desires VBAC, C/S for nonreassuring FHR.   Consent signed.   4. [redacted] weeks gestation of pregnancy   5. Back pain affecting pregnancy in third trimester --Rest/ice/heat/warm bath/increase PO fluids/Tylenol/pregnancy support belt    Preterm labor symptoms and general obstetric precautions including but not limited to vaginal bleeding, contractions, leaking of fluid and fetal movement were reviewed in detail with the patient. Please refer to After Visit Summary for other counseling recommendations.   Return in about 2 weeks (around 03/15/2021).  Future Appointments  Date Time Provider Department Center  03/11/2021 10:30 AM [facility] NURSE [facility]  03/11/2021 10:45 AM [facility] US4 [facility]  03/15/2021  3:50 PM Brand Males, CNM [facility] None    Sharen Counter, CNM

## 2021-03-11 ENCOUNTER — Encounter: Payer: Self-pay | Admitting: *Deleted

## 2021-03-11 ENCOUNTER — Ambulatory Visit (HOSPITAL_BASED_OUTPATIENT_CLINIC_OR_DEPARTMENT_OTHER): Payer: Medicaid Other

## 2021-03-11 ENCOUNTER — Other Ambulatory Visit: Payer: Self-pay

## 2021-03-11 ENCOUNTER — Ambulatory Visit: Payer: Medicaid Other | Attending: Obstetrics and Gynecology | Admitting: *Deleted

## 2021-03-11 VITALS — BP 127/74 | HR 98

## 2021-03-11 DIAGNOSIS — Z3480 Encounter for supervision of other normal pregnancy, unspecified trimester: Secondary | ICD-10-CM

## 2021-03-11 DIAGNOSIS — O99213 Obesity complicating pregnancy, third trimester: Secondary | ICD-10-CM

## 2021-03-11 DIAGNOSIS — O321XX0 Maternal care for breech presentation, not applicable or unspecified: Secondary | ICD-10-CM | POA: Diagnosis not present

## 2021-03-11 DIAGNOSIS — Z362 Encounter for other antenatal screening follow-up: Secondary | ICD-10-CM | POA: Diagnosis not present

## 2021-03-11 DIAGNOSIS — Z862 Personal history of diseases of the blood and blood-forming organs and certain disorders involving the immune mechanism: Secondary | ICD-10-CM | POA: Insufficient documentation

## 2021-03-11 DIAGNOSIS — Z8759 Personal history of other complications of pregnancy, childbirth and the puerperium: Secondary | ICD-10-CM

## 2021-03-11 DIAGNOSIS — E669 Obesity, unspecified: Secondary | ICD-10-CM | POA: Diagnosis not present

## 2021-03-11 DIAGNOSIS — O34219 Maternal care for unspecified type scar from previous cesarean delivery: Secondary | ICD-10-CM | POA: Insufficient documentation

## 2021-03-11 DIAGNOSIS — Z3A32 32 weeks gestation of pregnancy: Secondary | ICD-10-CM | POA: Insufficient documentation

## 2021-03-11 DIAGNOSIS — O99013 Anemia complicating pregnancy, third trimester: Secondary | ICD-10-CM | POA: Diagnosis not present

## 2021-03-11 DIAGNOSIS — O09293 Supervision of pregnancy with other poor reproductive or obstetric history, third trimester: Secondary | ICD-10-CM | POA: Insufficient documentation

## 2021-03-15 ENCOUNTER — Other Ambulatory Visit: Payer: Self-pay

## 2021-03-15 ENCOUNTER — Ambulatory Visit (INDEPENDENT_AMBULATORY_CARE_PROVIDER_SITE_OTHER): Payer: Medicaid Other

## 2021-03-15 VITALS — BP 127/88 | HR 99 | Wt 210.8 lb

## 2021-03-15 DIAGNOSIS — O0990 Supervision of high risk pregnancy, unspecified, unspecified trimester: Secondary | ICD-10-CM

## 2021-03-15 DIAGNOSIS — Z98891 History of uterine scar from previous surgery: Secondary | ICD-10-CM

## 2021-03-15 DIAGNOSIS — Z3A32 32 weeks gestation of pregnancy: Secondary | ICD-10-CM

## 2021-03-15 DIAGNOSIS — R030 Elevated blood-pressure reading, without diagnosis of hypertension: Secondary | ICD-10-CM

## 2021-03-15 DIAGNOSIS — O99013 Anemia complicating pregnancy, third trimester: Secondary | ICD-10-CM

## 2021-03-15 NOTE — Progress Notes (Signed)
Patient present for ROB. Patient denies having any headache, swelling, visual changes, or epigastric pain. Patient states that she was a little upset at the time of visit which may have contributed to elevated BP. Repeat normal. Patient does have access to BP cuff at home. Advised to periodically monitor BP at home to ensure levels remain in normal range. Patient verbalized understanding. No other concerns.

## 2021-03-15 NOTE — Progress Notes (Signed)
   PRENATAL VISIT NOTE  Subjective:  Heather Noble is a 25 y.o. G2P1001 at [redacted]w[redacted]d being seen today for ongoing prenatal care.  She is currently monitored for the following issues for this low-risk pregnancy and has Migraine with aura and without status migrainosus, not intractable; S/P cesarean section; Sickle cell trait ([place]); Rubella non-immune status, antepartum; Anemia in pregnancy; History of gestational hypertension; and Encounter for supervision of normal pregnancy, antepartum on their problem list.  Patient reports right sided pain that occurs at random times. Worse with standing and moving. Drank hot tea which helped her pain.  Contractions: Irritability. Vag. Bleeding: None.  Movement: Present. Denies leaking of fluid.   The following portions of the patient's history were reviewed and updated as appropriate: allergies, current medications, past family history, past medical history, past social history, past surgical history and problem list.   Objective:   Vitals:   03/15/21 1615 03/15/21 1617  BP: 135/90 127/88  Pulse: 98 99  Weight: 210 lb 12.8 oz (95.6 kg)     Fetal Status: Fetal Heart Rate (bpm): 135 Fundal Height: 33 cm Movement: Present     General:  Alert, oriented and cooperative. Patient is in no acute distress.  Skin: Skin is warm and dry. No rash noted.   Cardiovascular: Normal heart rate noted  Respiratory: Normal respiratory effort, no problems with respiration noted  Abdomen: Soft, gravid, appropriate for gestational age.  Pain/Pressure: Absent     Pelvic: Cervical exam deferred        Extremities: Normal range of motion.  Edema: None  Mental Status: Normal mood and affect. Normal behavior. Normal judgment and thought content.   Assessment and Plan:  Pregnancy: G2P1001 at [redacted]w[redacted]d 1. Supervision of high risk pregnancy, antepartum - Routine OB care. No concerns  2. Anemia during pregnancy in third trimester - Previously declined iron infusion. Remains  asymptomatic - Could repeat CBC at 36 weeks  3. S/P cesarean section - C/S for NRFHTs. Desires TOLAC. Consent signed 9/12  4. [redacted] weeks gestation of pregnancy   5. Elevated BP without diagnosis of hypertension - First BP elevated, pt attributed it to being upset. Repeat normotensive - Has history of gHTN in first pregnancy - Asymptomatic today. Will draw labs - Pre-e precautions reviewed  - CBC - Comp Met (CMET) - Protein / creatinine ratio, urine   Preterm labor symptoms and general obstetric precautions including but not limited to vaginal bleeding, contractions, leaking of fluid and fetal movement were reviewed in detail with the patient. Please refer to After Visit Summary for other counseling recommendations.   Return in about 2 weeks (around 03/29/2021).  Future Appointments  Date Time Provider [place]  03/29/2021  3:50 PM Shelly Bombard, MD [place] None     Renee Harder, CNM 03/15/21 4:54 PM

## 2021-03-17 LAB — CBC
Hematocrit: 29 % — ABNORMAL LOW (ref 34.0–46.6)
Hemoglobin: 9.5 g/dL — ABNORMAL LOW (ref 11.1–15.9)
MCH: 30 pg (ref 26.6–33.0)
MCHC: 32.8 g/dL (ref 31.5–35.7)
MCV: 92 fL (ref 79–97)
Platelets: 307 10*3/uL (ref 150–450)
RBC: 3.17 x10E6/uL — ABNORMAL LOW (ref 3.77–5.28)
RDW: 12.5 % (ref 11.7–15.4)
WBC: 6.1 10*3/uL (ref 3.4–10.8)

## 2021-03-17 LAB — COMPREHENSIVE METABOLIC PANEL
ALT: 9 IU/L (ref 0–32)
AST: 15 IU/L (ref 0–40)
Albumin/Globulin Ratio: 0.9 — ABNORMAL LOW (ref 1.2–2.2)
Albumin: 3.4 g/dL — ABNORMAL LOW (ref 3.9–5.0)
Alkaline Phosphatase: 155 IU/L — ABNORMAL HIGH (ref 44–121)
BUN/Creatinine Ratio: 8 — ABNORMAL LOW (ref 9–23)
BUN: 5 mg/dL — ABNORMAL LOW (ref 6–20)
Bilirubin Total: 0.5 mg/dL (ref 0.0–1.2)
CO2: 21 mmol/L (ref 20–29)
Calcium: 9.3 mg/dL (ref 8.7–10.2)
Chloride: 103 mmol/L (ref 96–106)
Creatinine, Ser: 0.66 mg/dL (ref 0.57–1.00)
Globulin, Total: 3.7 g/dL (ref 1.5–4.5)
Glucose: 122 mg/dL — ABNORMAL HIGH (ref 70–99)
Potassium: 4 mmol/L (ref 3.5–5.2)
Sodium: 142 mmol/L (ref 134–144)
Total Protein: 7.1 g/dL (ref 6.0–8.5)
eGFR: 125 mL/min/{1.73_m2} (ref >59)

## 2021-03-17 LAB — PROTEIN / CREATININE RATIO, URINE
Creatinine, Urine: 93.1 mg/dL
Protein, Ur: 12.2 mg/dL
Protein/Creat Ratio: 131 mg/g creat (ref 0–200)

## 2021-03-29 ENCOUNTER — Ambulatory Visit (INDEPENDENT_AMBULATORY_CARE_PROVIDER_SITE_OTHER): Payer: Medicaid Other | Admitting: Obstetrics

## 2021-03-29 ENCOUNTER — Other Ambulatory Visit: Payer: Self-pay

## 2021-03-29 ENCOUNTER — Encounter: Payer: Self-pay | Admitting: Obstetrics

## 2021-03-29 VITALS — BP 121/76 | HR 85 | Wt 216.0 lb

## 2021-03-29 DIAGNOSIS — Z98891 History of uterine scar from previous surgery: Secondary | ICD-10-CM

## 2021-03-29 DIAGNOSIS — O34219 Maternal care for unspecified type scar from previous cesarean delivery: Secondary | ICD-10-CM

## 2021-03-29 DIAGNOSIS — O99013 Anemia complicating pregnancy, third trimester: Secondary | ICD-10-CM

## 2021-03-29 DIAGNOSIS — O0990 Supervision of high risk pregnancy, unspecified, unspecified trimester: Secondary | ICD-10-CM

## 2021-03-29 NOTE — Progress Notes (Signed)
Subjective:  Heather Noble is a 25 y.o. G2P1001 at [redacted]w[redacted]d being seen today for ongoing prenatal care.  She is currently monitored for the following issues for this high-risk pregnancy and has Migraine with aura and without status migrainosus, not intractable; S/P cesarean section; Sickle cell trait (HCC); Rubella non-immune status, antepartum; Anemia in pregnancy; History of gestational hypertension; and Encounter for supervision of normal pregnancy, antepartum on their problem list.  Patient reports no complaints.  Contractions: Irritability. Vag. Bleeding: None.  Movement: Present. Denies leaking of fluid.   The following portions of the patient's history were reviewed and updated as appropriate: allergies, current medications, past family history, past medical history, past social history, past surgical history and problem list. Problem list updated.  Objective:   Vitals:   03/29/21 1602  BP: 121/76  Pulse: 85  Weight: 216 lb (98 kg)    Fetal Status: Fetal Heart Rate (bpm): 140   Movement: Present     General:  Alert, oriented and cooperative. Patient is in no acute distress.  Skin: Skin is warm and dry. No rash noted.   Cardiovascular: Normal heart rate noted  Respiratory: Normal respiratory effort, no problems with respiration noted  Abdomen: Soft, gravid, appropriate for gestational age. Pain/Pressure: Absent     Pelvic:  Cervical exam deferred        Extremities: Normal range of motion.  Edema: None  Mental Status: Normal mood and affect. Normal behavior. Normal judgment and thought content.   Urinalysis:      Assessment and Plan:  Pregnancy: G2P1001 at [redacted]w[redacted]d  1. Supervision of high risk pregnancy, antepartum  2. Anemia during pregnancy in third trimester, severe.  Declines iron infusion - Ferrous sulfate 324 mg po every other day - Fusion Plus samples dispensed, 1 cap po every other day.  Hold FeSO$ for now.  3. S/P cesarean section for NRFHR  4. Patient desires  vaginal birth after cesarean section (VBAC) - VBAC consent signed 02-14-2021  Preterm labor symptoms and general obstetric precautions including but not limited to vaginal bleeding, contractions, leaking of fluid and fetal movement were reviewed in detail with the patient. Please refer to After Visit Summary for other counseling recommendations.   Return in about 2 weeks (around 04/12/2021) for ROB.   Brock Bad, MD  03/29/21

## 2021-04-12 ENCOUNTER — Ambulatory Visit (INDEPENDENT_AMBULATORY_CARE_PROVIDER_SITE_OTHER): Payer: Medicaid Other | Admitting: Advanced Practice Midwife

## 2021-04-12 ENCOUNTER — Other Ambulatory Visit: Payer: Self-pay

## 2021-04-12 ENCOUNTER — Other Ambulatory Visit (HOSPITAL_COMMUNITY)
Admission: RE | Admit: 2021-04-12 | Discharge: 2021-04-12 | Disposition: A | Discharge status: Home / Self Care | Payer: Medicaid Other | Source: Ambulatory Visit | Attending: Advanced Practice Midwife | Admitting: Advanced Practice Midwife

## 2021-04-12 VITALS — BP 136/84 | HR 83 | Wt 216.8 lb

## 2021-04-12 DIAGNOSIS — Z3A36 36 weeks gestation of pregnancy: Secondary | ICD-10-CM

## 2021-04-12 DIAGNOSIS — Z3480 Encounter for supervision of other normal pregnancy, unspecified trimester: Secondary | ICD-10-CM

## 2021-04-12 DIAGNOSIS — O99013 Anemia complicating pregnancy, third trimester: Secondary | ICD-10-CM

## 2021-04-12 DIAGNOSIS — O34219 Maternal care for unspecified type scar from previous cesarean delivery: Secondary | ICD-10-CM

## 2021-04-12 NOTE — Progress Notes (Signed)
   PRENATAL VISIT NOTE  Subjective:  Heather Noble is a 25 y.o. G2P1001 at [redacted]w[redacted]d being seen today for ongoing prenatal care.  She is currently monitored for the following issues for this low-risk pregnancy and has Migraine with aura and without status migrainosus, not intractable; S/P cesarean section; Sickle cell trait (HCC); Rubella non-immune status, antepartum; Anemia in pregnancy; History of gestational hypertension; and Encounter for supervision of normal pregnancy, antepartum on their problem list.  Patient reports occasional contractions.  Contractions: Irritability. Vag. Bleeding: None.  Movement: Present. Denies leaking of fluid.   The following portions of the patient's history were reviewed and updated as appropriate: allergies, current medications, past family history, past medical history, past social history, past surgical history and problem list.   Objective:   Vitals:   04/12/21 1601  BP: 136/84  Pulse: 83  Weight: 216 lb 12.8 oz (98.3 kg)    Fetal Status: Fetal Heart Rate (bpm): 134   Movement: Present     General:  Alert, oriented and cooperative. Patient is in no acute distress.  Skin: Skin is warm and dry. No rash noted.   Cardiovascular: Normal heart rate noted  Respiratory: Normal respiratory effort, no problems with respiration noted  Abdomen: Soft, gravid, appropriate for gestational age.  Pain/Pressure: Present     Pelvic: Cervical exam deferred        Extremities: Normal range of motion.  Edema: None  Mental Status: Normal mood and affect. Normal behavior. Normal judgment and thought content.   Assessment and Plan:  Pregnancy: G2P1001 at [redacted]w[redacted]d 1. Supervision of other normal pregnancy, antepartum --Anticipatory guidance about next visits/weeks of pregnancy given. --next visit in 1 week - Strep Gp B NAA - Cervicovaginal ancillary only  2. Anemia during pregnancy in third trimester --IV Venofer given in second trimester, pt tolerated well.   Second  dose ordered for before delivery.  3. Patient desires vaginal birth after cesarean section (VBAC) --consent signed and in chart  4. [redacted] weeks gestation of pregnancy   Preterm labor symptoms and general obstetric precautions including but not limited to vaginal bleeding, contractions, leaking of fluid and fetal movement were reviewed in detail with the patient. Please refer to After Visit Summary for other counseling recommendations.   No follow-ups on file.  Future Appointments  Date Time Provider Department Center  04/18/2021  3:50 PM Leftwich-Kirby, Wilmer Floor, CNM [facility] None    Sharen Counter, CNM

## 2021-04-13 LAB — CERVICOVAGINAL ANCILLARY ONLY
Chlamydia: NEGATIVE
Comment: NEGATIVE
Comment: NEGATIVE
Comment: NORMAL
Neisseria Gonorrhea: NEGATIVE
Trichomonas: NEGATIVE

## 2021-04-13 LAB — OB RESULTS CONSOLE GC/CHLAMYDIA: Gonorrhea: NEGATIVE

## 2021-04-14 LAB — STREP GP B NAA: Strep Gp B NAA: POSITIVE — AB

## 2021-04-16 ENCOUNTER — Encounter: Payer: Self-pay | Admitting: Advanced Practice Midwife

## 2021-04-16 DIAGNOSIS — O99820 Streptococcus B carrier state complicating pregnancy: Secondary | ICD-10-CM | POA: Insufficient documentation

## 2021-04-18 ENCOUNTER — Ambulatory Visit (INDEPENDENT_AMBULATORY_CARE_PROVIDER_SITE_OTHER): Payer: Medicaid Other | Admitting: Advanced Practice Midwife

## 2021-04-18 ENCOUNTER — Other Ambulatory Visit: Payer: Self-pay

## 2021-04-18 VITALS — BP 134/89 | HR 83 | Wt 216.0 lb

## 2021-04-18 DIAGNOSIS — Z3A37 37 weeks gestation of pregnancy: Secondary | ICD-10-CM

## 2021-04-18 DIAGNOSIS — O99013 Anemia complicating pregnancy, third trimester: Secondary | ICD-10-CM

## 2021-04-18 DIAGNOSIS — Z3480 Encounter for supervision of other normal pregnancy, unspecified trimester: Secondary | ICD-10-CM

## 2021-04-18 DIAGNOSIS — O34219 Maternal care for unspecified type scar from previous cesarean delivery: Secondary | ICD-10-CM

## 2021-04-18 NOTE — Progress Notes (Signed)
   PRENATAL VISIT NOTE  Subjective:  Heather Noble is a 25 y.o. G2P1001 at [redacted]w[redacted]d being seen today for ongoing prenatal care.  She is currently monitored for the following issues for this low-risk pregnancy and has Migraine with aura and without status migrainosus, not intractable; S/P cesarean section; Sickle cell trait (HCC); Rubella non-immune status, antepartum; Anemia in pregnancy; History of gestational hypertension; Encounter for supervision of normal pregnancy, antepartum; and GBS (group B Streptococcus carrier), +RV culture, currently pregnant on their problem list.  Patient reports no complaints.  Contractions: Not present. Vag. Bleeding: None.  Movement: Present. Denies leaking of fluid.   The following portions of the patient's history were reviewed and updated as appropriate: allergies, current medications, past family history, past medical history, past social history, past surgical history and problem list.   Objective:   Vitals:   04/18/21 1618  BP: 134/89  Pulse: 83  Weight: 216 lb (98 kg)    Fetal Status: Fetal Heart Rate (bpm): 130   Movement: Present     General:  Alert, oriented and cooperative. Patient is in no acute distress.  Skin: Skin is warm and dry. No rash noted.   Cardiovascular: Normal heart rate noted  Respiratory: Normal respiratory effort, no problems with respiration noted  Abdomen: Soft, gravid, appropriate for gestational age.  Pain/Pressure: Present     Pelvic: Cervical exam deferred        Extremities: Normal range of motion.     Mental Status: Normal mood and affect. Normal behavior. Normal judgment and thought content.   Assessment and Plan:  Pregnancy: G2P1001 at [redacted]w[redacted]d 1. Supervision of other normal pregnancy, antepartum --Anticipatory guidance about next visits/weeks of pregnancy given. --Next visit in 1 week  2. Anemia during pregnancy in third trimester --S/P iron infusion. CBC today, consider second infusion if remains low.  3.  Patient desires vaginal birth after cesarean section (VBAC) --Consent signed  4. [redacted] weeks gestation of pregnancy   Term labor symptoms and general obstetric precautions including but not limited to vaginal bleeding, contractions, leaking of fluid and fetal movement were reviewed in detail with the patient. Please refer to After Visit Summary for other counseling recommendations.   No follow-ups on file.  Future Appointments  Date Time Provider Department Center  04/26/2021  2:50 PM Adam Phenix, MD [facility] None     Sharen Counter, CNM

## 2021-04-19 ENCOUNTER — Other Ambulatory Visit: Payer: Self-pay | Admitting: Advanced Practice Midwife

## 2021-04-19 LAB — CBC
Hematocrit: 28.5 % — ABNORMAL LOW (ref 34.0–46.6)
Hemoglobin: 9.2 g/dL — ABNORMAL LOW (ref 11.1–15.9)
MCH: 29.2 pg (ref 26.6–33.0)
MCHC: 32.3 g/dL (ref 31.5–35.7)
MCV: 91 fL (ref 79–97)
Platelets: 287 10*3/uL (ref 150–450)
RBC: 3.15 x10E6/uL — ABNORMAL LOW (ref 3.77–5.28)
RDW: 12.9 % (ref 11.7–15.4)
WBC: 6.9 10*3/uL (ref 3.4–10.8)

## 2021-04-26 ENCOUNTER — Inpatient Hospital Stay (HOSPITAL_COMMUNITY)
Admission: AD | Admit: 2021-04-26 | Discharge: 2021-04-30 | DRG: 807 | Disposition: A | Discharge status: Home / Self Care | Payer: Medicaid Other | Attending: Obstetrics and Gynecology | Admitting: Obstetrics and Gynecology

## 2021-04-26 ENCOUNTER — Ambulatory Visit (INDEPENDENT_AMBULATORY_CARE_PROVIDER_SITE_OTHER): Payer: Medicaid Other | Admitting: Obstetrics & Gynecology

## 2021-04-26 ENCOUNTER — Other Ambulatory Visit: Payer: Self-pay

## 2021-04-26 VITALS — BP 158/93 | HR 71 | Wt 216.5 lb

## 2021-04-26 DIAGNOSIS — O34219 Maternal care for unspecified type scar from previous cesarean delivery: Secondary | ICD-10-CM | POA: Diagnosis present

## 2021-04-26 DIAGNOSIS — O99820 Streptococcus B carrier state complicating pregnancy: Secondary | ICD-10-CM

## 2021-04-26 DIAGNOSIS — O99824 Streptococcus B carrier state complicating childbirth: Secondary | ICD-10-CM | POA: Diagnosis present

## 2021-04-26 DIAGNOSIS — Z3A38 38 weeks gestation of pregnancy: Secondary | ICD-10-CM

## 2021-04-26 DIAGNOSIS — Z8759 Personal history of other complications of pregnancy, childbirth and the puerperium: Secondary | ICD-10-CM

## 2021-04-26 DIAGNOSIS — Z20822 Contact with and (suspected) exposure to covid-19: Secondary | ICD-10-CM | POA: Diagnosis present

## 2021-04-26 DIAGNOSIS — O09899 Supervision of other high risk pregnancies, unspecified trimester: Secondary | ICD-10-CM

## 2021-04-26 DIAGNOSIS — G43109 Migraine with aura, not intractable, without status migrainosus: Secondary | ICD-10-CM | POA: Diagnosis present

## 2021-04-26 DIAGNOSIS — O9902 Anemia complicating childbirth: Secondary | ICD-10-CM | POA: Diagnosis present

## 2021-04-26 DIAGNOSIS — O1414 Severe pre-eclampsia complicating childbirth: Principal | ICD-10-CM | POA: Diagnosis present

## 2021-04-26 DIAGNOSIS — Z3480 Encounter for supervision of other normal pregnancy, unspecified trimester: Secondary | ICD-10-CM

## 2021-04-26 DIAGNOSIS — Z98891 History of uterine scar from previous surgery: Secondary | ICD-10-CM

## 2021-04-26 DIAGNOSIS — O99019 Anemia complicating pregnancy, unspecified trimester: Secondary | ICD-10-CM | POA: Diagnosis present

## 2021-04-26 DIAGNOSIS — O139 Gestational [pregnancy-induced] hypertension without significant proteinuria, unspecified trimester: Secondary | ICD-10-CM | POA: Diagnosis present

## 2021-04-26 DIAGNOSIS — Z3490 Encounter for supervision of normal pregnancy, unspecified, unspecified trimester: Secondary | ICD-10-CM

## 2021-04-26 DIAGNOSIS — Z2839 Other underimmunization status: Secondary | ICD-10-CM

## 2021-04-26 DIAGNOSIS — D573 Sickle-cell trait: Secondary | ICD-10-CM | POA: Diagnosis present

## 2021-04-26 DIAGNOSIS — O1410 Severe pre-eclampsia, unspecified trimester: Secondary | ICD-10-CM | POA: Clinically undetermined

## 2021-04-26 DIAGNOSIS — O34211 Maternal care for low transverse scar from previous cesarean delivery: Secondary | ICD-10-CM | POA: Diagnosis not present

## 2021-04-26 DIAGNOSIS — O134 Gestational [pregnancy-induced] hypertension without significant proteinuria, complicating childbirth: Secondary | ICD-10-CM | POA: Diagnosis present

## 2021-04-26 LAB — COMPREHENSIVE METABOLIC PANEL
ALT: 19 U/L (ref 0–44)
AST: 35 U/L (ref 15–41)
Albumin: 3 g/dL — ABNORMAL LOW (ref 3.5–5.0)
Alkaline Phosphatase: 269 U/L — ABNORMAL HIGH (ref 38–126)
Anion gap: 12 (ref 5–15)
BUN: 7 mg/dL (ref 6–20)
CO2: 20 mmol/L — ABNORMAL LOW (ref 22–32)
Calcium: 9 mg/dL (ref 8.9–10.3)
Chloride: 102 mmol/L (ref 98–111)
Creatinine, Ser: 0.71 mg/dL (ref 0.44–1.00)
GFR, Estimated: >60 mL/min (ref >60)
Glucose, Bld: 122 mg/dL — ABNORMAL HIGH (ref 70–99)
Potassium: 3.3 mmol/L — ABNORMAL LOW (ref 3.5–5.1)
Sodium: 134 mmol/L — ABNORMAL LOW (ref 135–145)
Total Bilirubin: 1 mg/dL (ref 0.3–1.2)
Total Protein: 7.2 g/dL (ref 6.5–8.1)

## 2021-04-26 LAB — CBC
HCT: 32.2 % — ABNORMAL LOW (ref 36.0–46.0)
Hemoglobin: 10.3 g/dL — ABNORMAL LOW (ref 12.0–15.0)
MCH: 29.8 pg (ref 26.0–34.0)
MCHC: 32 g/dL (ref 30.0–36.0)
MCV: 93.1 fL (ref 80.0–100.0)
Platelets: 316 10*3/uL (ref 150–400)
RBC: 3.46 MIL/uL — ABNORMAL LOW (ref 3.87–5.11)
RDW: 13.1 % (ref 11.5–15.5)
WBC: 8 10*3/uL (ref 4.0–10.5)
nRBC: 0 % (ref 0.0–0.2)

## 2021-04-26 LAB — PROTEIN / CREATININE RATIO, URINE
Creatinine, Urine: 177.12 mg/dL
Protein Creatinine Ratio: 0.18 mg/mg{Cre} — ABNORMAL HIGH (ref 0.00–0.15)
Total Protein, Urine: 32 mg/dL

## 2021-04-26 LAB — TYPE AND SCREEN
ABO/RH(D): A POS
Antibody Screen: NEGATIVE

## 2021-04-26 LAB — RESP PANEL BY RT-PCR (FLU A&B, COVID) ARPGX2
Influenza A by PCR: NEGATIVE
Influenza B by PCR: NEGATIVE
SARS Coronavirus 2 by RT PCR: NEGATIVE

## 2021-04-26 MED ORDER — FLEET ENEMA 7-19 GM/118ML RE ENEM: 1.0000 | ENEMA | Freq: Every day | RECTAL | Status: DC | PRN

## 2021-04-26 MED ORDER — TERBUTALINE SULFATE 1 MG/ML IJ SOLN: 0.2500 mg | Freq: Once | INTRAMUSCULAR | Status: DC | PRN

## 2021-04-26 MED ORDER — OXYCODONE-ACETAMINOPHEN 5-325 MG PO TABS: 2.0000 | ORAL_TABLET | ORAL | Status: DC | PRN

## 2021-04-26 MED ORDER — SOD CITRATE-CITRIC ACID 500-334 MG/5ML PO SOLN: 30.0000 mL | ORAL | Status: DC | PRN

## 2021-04-26 MED ORDER — OXYTOCIN-SODIUM CHLORIDE 30-0.9 UT/500ML-% IV SOLN
1.0000 m[IU]/min | INTRAVENOUS | Status: DC
Start: 2021-04-26 — End: 2021-04-29
  Administered 2021-04-26: 2 m[IU]/min via INTRAVENOUS
  Administered 2021-04-28: 11 m[IU]/min via INTRAVENOUS
  Filled 2021-04-26: qty 500

## 2021-04-26 MED ORDER — LACTATED RINGERS IV SOLN: 500.0000 mL | INTRAVENOUS | Status: DC | PRN

## 2021-04-26 MED ORDER — FENTANYL CITRATE (PF) 100 MCG/2ML IJ SOLN
50.0000 ug | INTRAMUSCULAR | Status: DC | PRN
  Administered 2021-04-27 (×3): 100 ug via INTRAVENOUS
  Administered 2021-04-28 (×2): 50 ug via INTRAVENOUS
  Administered 2021-04-28: 100 ug via INTRAVENOUS
  Administered 2021-04-28: 50 ug via INTRAVENOUS
  Filled 2021-04-26 (×7): qty 2

## 2021-04-26 MED ORDER — OXYCODONE-ACETAMINOPHEN 5-325 MG PO TABS: 1.0000 | ORAL_TABLET | ORAL | Status: DC | PRN

## 2021-04-26 MED ORDER — LACTATED RINGERS IV SOLN: INTRAVENOUS | Status: DC

## 2021-04-26 MED ORDER — OXYTOCIN-SODIUM CHLORIDE 30-0.9 UT/500ML-% IV SOLN
2.5000 [IU]/h | INTRAVENOUS | Status: DC
  Filled 2021-04-26: qty 500

## 2021-04-26 MED ORDER — SODIUM CHLORIDE 0.9 % IV SOLN
5.0000 10*6.[IU] | Freq: Once | INTRAVENOUS | Status: AC
  Administered 2021-04-27: 5 10*6.[IU] via INTRAVENOUS
  Filled 2021-04-26: qty 5

## 2021-04-26 MED ORDER — OXYTOCIN BOLUS FROM INFUSION
333.0000 mL | Freq: Once | INTRAVENOUS | Status: AC
  Administered 2021-04-28: 333 mL via INTRAVENOUS

## 2021-04-26 MED ORDER — ACETAMINOPHEN 325 MG PO TABS
650.0000 mg | ORAL_TABLET | ORAL | Status: DC | PRN
  Administered 2021-04-27 (×3): 650 mg via ORAL
  Filled 2021-04-26 (×3): qty 2

## 2021-04-26 MED ORDER — ONDANSETRON HCL 4 MG/2ML IJ SOLN: 4.0000 mg | Freq: Four times a day (QID) | INTRAMUSCULAR | Status: DC | PRN

## 2021-04-26 MED ORDER — HYDROXYZINE HCL 50 MG PO TABS: 50.0000 mg | ORAL_TABLET | Freq: Four times a day (QID) | ORAL | Status: DC | PRN

## 2021-04-26 MED ORDER — LIDOCAINE HCL (PF) 1 % IJ SOLN
30.0000 mL | INTRAMUSCULAR | Status: AC | PRN
  Administered 2021-04-28: 30 mL via SUBCUTANEOUS
  Filled 2021-04-26: qty 30

## 2021-04-26 MED ORDER — PENICILLIN G POT IN DEXTROSE 60000 UNIT/ML IV SOLN
3.0000 10*6.[IU] | INTRAVENOUS | Status: DC
  Administered 2021-04-27 – 2021-04-28 (×6): 3 10*6.[IU] via INTRAVENOUS
  Filled 2021-04-26 (×7): qty 50

## 2021-04-26 NOTE — Progress Notes (Signed)
   PRENATAL VISIT NOTE  Subjective:  Heather Noble is a 25 y.o. G2P1001 at [redacted]w[redacted]d being seen today for ongoing prenatal care.  She is currently monitored for the following issues for this high-risk pregnancy and has Migraine with aura and without status migrainosus, not intractable; S/P cesarean section; Sickle cell trait (HCC); Rubella non-immune status, antepartum; Anemia in pregnancy; History of gestational hypertension; Encounter for supervision of normal pregnancy, antepartum; and GBS (group B Streptococcus carrier), +RV culture, currently pregnant on their problem list.  Patient reports contractions since This morning .  Contractions: Regular. Vag. Bleeding: None.  Movement: Present. Denies leaking of fluid.   The following portions of the patient's history were reviewed and updated as appropriate: allergies, current medications, past family history, past medical history, past social history, past surgical history and problem list.     Vitals:   04/26/21 1508 04/26/21 1534  BP: (!) 157/95 (!) 158/93  Pulse: 87 71  Weight: 216 lb 8 oz (98.2 kg)     Fetal Status: Fetal Heart Rate (bpm): 133   Movement: Present     General:  Alert, oriented and cooperative. Patient is in no acute distress.  Skin: Skin is warm and dry. No rash noted.   Cardiovascular: Normal heart rate noted  Respiratory: Normal respiratory effort, no problems with respiration noted  Abdomen: Soft, gravid, appropriate for gestational age.  Pain/Pressure: Present     Pelvic: Cervical exam performed in the presence of a chaperone        Extremities: Normal range of motion.  Edema: None  Mental Status: Normal mood and affect. Normal behavior. Normal judgment and thought content.   Assessment and Plan:  Pregnancy: G2P1001 at [redacted]w[redacted]d 1. Supervision of other normal pregnancy, antepartum   2. History of gestational hypertension BP elevated  3. S/P cesarean section Plans TOLAC  4. GBS (group B Streptococcus  carrier), +RV culture, currently pregnant Treat in labor  Term labor symptoms and general obstetric precautions including but not limited to vaginal bleeding, contractions, leaking of fluid and fetal movement were reviewed in detail with the patient. Please refer to After Visit Summary for other counseling recommendations.  Sent to L&D for GHTN Return in about 1 week (around 05/03/2021).  Future Appointments  Date Time Provider Department Center  05/02/2021  8:00 AM [id number] None  05/04/2021  2:30 PM Brock Bad, MD [facility] None    Scheryl Darter, MD

## 2021-04-26 NOTE — H&P (Signed)
Heather Noble is a 25 y.o. female presenting for IOL for new onset GHTN, ruling out preeclampsia.  Patient was seen in office today, found to have near-severe range BP and upon repeat, sent in for IOL for new onset HTN, upon arrival had another BP elevation of 152/97. No symptoms of preeclampsia, denies HA (despite given h/o migraines), changes in vision, RUQ pain, swelling, chest pain, SOB. Awaiting labwork to confirm.   Patient has known CS x1, desires TOLAC.  Counseled regarding TOLAC vs RCS; risks/benefits discussed in detail. All questions answered.  Patient elects for TOLAC, verbal  consent given.     OB History     Gravida  2   Para  1   Term  1   Preterm  0   AB  0   Living  1      SAB  0   IAB  0   Ectopic  0   Multiple  0   Live Births  1          Past Medical History:  Diagnosis Date   Headache    Pregnancy induced hypertension    Past Surgical History:  Procedure Laterality Date   CESAREAN SECTION N/A 04/21/2017   Procedure: CESAREAN SECTION;  Surgeon: Tereso Newcomer, MD;  Location: [facility];  Service: Obstetrics;  Laterality: N/A;   WISDOM TOOTH EXTRACTION  2015   Family History: family history includes Cancer in her paternal grandfather; Diabetes in her maternal grandmother; Gout in her maternal grandmother; Hypertension in her maternal grandmother; Osteoarthritis in her maternal grandmother; Thyroid disease in her maternal grandmother. Social History:  reports that she has never smoked. She has never used smokeless tobacco. She reports that she does not drink alcohol and does not use drugs.     Maternal Diabetes: No Genetic Screening: Normal Maternal Ultrasounds/Referrals: Normal Fetal Ultrasounds or other Referrals:  None Maternal Substance Abuse:  No Significant Maternal Medications:  None Significant Maternal Lab Results:  Group B Strep positive and Other: Sickle cell trait; Rubella Non-immune Other Comments:   None  Review of Systems  Constitutional:  Negative for chills and fever.  Respiratory:  Negative for cough and shortness of breath.   Cardiovascular:  Negative for chest pain.  Gastrointestinal:  Negative for abdominal pain.  Skin:  Negative for rash.  Neurological:  Negative for headaches.  History   Last menstrual period 07/21/2020. Maternal Exam:  Abdomen: Patient reports no abdominal tenderness.  Physical Exam Constitutional:      General: She is not in acute distress.    Appearance: She is well-developed.  HENT:     Head: Normocephalic and atraumatic.  Cardiovascular:     Rate and Rhythm: Normal rate.  Pulmonary:     Effort: Pulmonary effort is normal. No respiratory distress.  Musculoskeletal:        General: No tenderness.  Skin:    General: Skin is warm and dry.     Findings: No erythema or rash.  Neurological:     Mental Status: She is alert and oriented to person, place, and time.    Prenatal labs: ABO, Rh: A/Positive/-- (05/04 1059) Antibody: Negative (05/04 1059) Rubella: <0.90 (05/04 1059) RPR: Non Reactive (09/13 1133)  HBsAg: Negative (05/04 1059)  HIV: Non Reactive (09/13 1133)  GBS: Positive/-- (11/08 1625)   Assessment/Plan: 25 y.o. G2P1001 at [redacted]w[redacted]d here for IOL 2/2 new onset GHTN, ruling out preeclampsia.   #Labor:  Induction with pitocin, considering FB placement #Pain: May have  epidural if she desires. #FWB:  Cat I #ID:  GBS pos, PCN  #MOF: breast #MOC: None    Jen Mow, DO 04/26/2021, 5:57 PM

## 2021-04-26 NOTE — Progress Notes (Addendum)
Heather Noble is a 25 y.o. G2P1001 at [redacted]w[redacted]d admitted for TOLAC/induction of labor due to gHTN.  Subjective: Reports having some sporadic contractions. Ok with check now and foley balloon placement if appropriate  Objective: BP (!) 135/99   Pulse 85   Temp 98.1 F (36.7 C) (Oral)   Resp 18   LMP 07/21/2020  No intake/output data recorded. No intake/output data recorded.  FHT:  FHR: 120 bpm, variability: moderate,  accelerations:  Present,  decelerations:  Absent UC:   irregular, every 4-7 minutes SVE:   Dilation: Closed Effacement (%): Thick Station: -3 Exam by:: Dr. Linton Flemings used to confirm vertex presentation Labs: Lab Results  Component Value Date   WBC 8.0 04/26/2021   HGB 10.3 (L) 04/26/2021   HCT 32.2 (L) 04/26/2021   MCV 93.1 04/26/2021   PLT 316 04/26/2021    Assessment / Plan: Induction of labor/TOLAC due to gHTN starting IOL now. Cervix closed and posterior but soft.  Given TOLAC will start with low dose pitocin and check in 4 hrs for balloon placement.   gHTN BP mild range (130s/90s) at last two checks, prior to that in 150s-90s-100s. No symptoms. P:C 0.18. Plt normal. LFTs normal - continue to monitor  Fetal Wellbeing:  Category I Pain Control:   plans IV pain meds and open to nitrous. Wants to avoid epidural if possible. I/D:   GBS pos>PCN     Warner Mccreedy 04/26/2021, 10:08 PM

## 2021-04-27 ENCOUNTER — Encounter (HOSPITAL_COMMUNITY): Payer: Self-pay | Admitting: Anesthesiology

## 2021-04-27 ENCOUNTER — Encounter (HOSPITAL_COMMUNITY): Payer: Self-pay | Admitting: Obstetrics & Gynecology

## 2021-04-27 LAB — CBC
HCT: 28.9 % — ABNORMAL LOW (ref 36.0–46.0)
Hemoglobin: 9.4 g/dL — ABNORMAL LOW (ref 12.0–15.0)
MCH: 30 pg (ref 26.0–34.0)
MCHC: 32.5 g/dL (ref 30.0–36.0)
MCV: 92.3 fL (ref 80.0–100.0)
Platelets: 276 10*3/uL (ref 150–400)
RBC: 3.13 MIL/uL — ABNORMAL LOW (ref 3.87–5.11)
RDW: 13.2 % (ref 11.5–15.5)
WBC: 7.2 10*3/uL (ref 4.0–10.5)
nRBC: 0 % (ref 0.0–0.2)

## 2021-04-27 LAB — PROTEIN / CREATININE RATIO, URINE
Creatinine, Urine: 47.12 mg/dL
Protein Creatinine Ratio: 0.3 mg/mg{Cre} — ABNORMAL HIGH (ref 0.00–0.15)
Total Protein, Urine: 14 mg/dL

## 2021-04-27 LAB — COMPREHENSIVE METABOLIC PANEL
ALT: 16 U/L (ref 0–44)
AST: 28 U/L (ref 15–41)
Albumin: 2.7 g/dL — ABNORMAL LOW (ref 3.5–5.0)
Alkaline Phosphatase: 271 U/L — ABNORMAL HIGH (ref 38–126)
Anion gap: 8 (ref 5–15)
BUN: 7 mg/dL (ref 6–20)
CO2: 23 mmol/L (ref 22–32)
Calcium: 8.5 mg/dL — ABNORMAL LOW (ref 8.9–10.3)
Chloride: 103 mmol/L (ref 98–111)
Creatinine, Ser: 0.66 mg/dL (ref 0.44–1.00)
GFR, Estimated: >60 mL/min (ref >60)
Glucose, Bld: 97 mg/dL (ref 70–99)
Potassium: 3.2 mmol/L — ABNORMAL LOW (ref 3.5–5.1)
Sodium: 134 mmol/L — ABNORMAL LOW (ref 135–145)
Total Bilirubin: 0.9 mg/dL (ref 0.3–1.2)
Total Protein: 6.6 g/dL (ref 6.5–8.1)

## 2021-04-27 LAB — RPR: RPR Ser Ql: NONREACTIVE

## 2021-04-27 MED ORDER — LABETALOL HCL 5 MG/ML IV SOLN
20.0000 mg | INTRAVENOUS | Status: DC | PRN
  Filled 2021-04-27: qty 4

## 2021-04-27 MED ORDER — CYCLOBENZAPRINE HCL 5 MG PO TABS
10.0000 mg | ORAL_TABLET | Freq: Three times a day (TID) | ORAL | Status: DC | PRN
  Administered 2021-04-27: 10 mg via ORAL
  Filled 2021-04-27: qty 2

## 2021-04-27 MED ORDER — LABETALOL HCL 5 MG/ML IV SOLN: 80.0000 mg | INTRAVENOUS | Status: DC | PRN

## 2021-04-27 MED ORDER — METOCLOPRAMIDE HCL 5 MG/ML IJ SOLN
10.0000 mg | Freq: Four times a day (QID) | INTRAMUSCULAR | Status: DC | PRN
  Administered 2021-04-27: 10 mg via INTRAVENOUS
  Filled 2021-04-27: qty 2

## 2021-04-27 MED ORDER — HYDRALAZINE HCL 20 MG/ML IJ SOLN: 10.0000 mg | INTRAMUSCULAR | Status: DC | PRN

## 2021-04-27 MED ORDER — MAGNESIUM SULFATE BOLUS VIA INFUSION
4.0000 g | Freq: Once | INTRAVENOUS | Status: AC
  Administered 2021-04-27: 4 g via INTRAVENOUS
  Filled 2021-04-27: qty 1000

## 2021-04-27 MED ORDER — LABETALOL HCL 5 MG/ML IV SOLN
20.0000 mg | INTRAVENOUS | Status: DC | PRN
  Administered 2021-04-27 – 2021-04-28 (×2): 20 mg via INTRAVENOUS
  Filled 2021-04-27: qty 4

## 2021-04-27 MED ORDER — LABETALOL HCL 5 MG/ML IV SOLN
40.0000 mg | INTRAVENOUS | Status: DC | PRN
  Administered 2021-04-27 – 2021-04-28 (×2): 40 mg via INTRAVENOUS
  Filled 2021-04-27 (×2): qty 8

## 2021-04-27 MED ORDER — MAGNESIUM SULFATE 40 GM/1000ML IV SOLN
2.0000 g/h | INTRAVENOUS | Status: DC
  Administered 2021-04-27 – 2021-04-28 (×2): 2 g/h via INTRAVENOUS
  Filled 2021-04-27 (×2): qty 1000

## 2021-04-27 MED ORDER — LABETALOL HCL 5 MG/ML IV SOLN: 40.0000 mg | INTRAVENOUS | Status: DC | PRN

## 2021-04-27 MED ORDER — LABETALOL HCL 5 MG/ML IV SOLN
80.0000 mg | INTRAVENOUS | Status: DC | PRN
  Administered 2021-04-27 – 2021-04-28 (×2): 80 mg via INTRAVENOUS
  Filled 2021-04-27 (×2): qty 16

## 2021-04-27 NOTE — Progress Notes (Signed)
Heather Noble is a 25 y.o. G2P1001 at [redacted]w[redacted]d by 9 week ultrasound admitted for induction of labor due to [facility] with development of PEC with severe features intrapartum.  Hx previous C/S x 1.  Subjective: Pt breathing with contractions, using IV pain medication, coping well with pain.  Family in room for support.  Objective: BP 138/87   Pulse 90   Temp 97.6 F (36.4 C) (Oral)   Resp 16   Ht 5\' 5"  (1.651 m)   Wt 98 kg   LMP 07/21/2020   BMI 35.94 kg/m  No intake/output data recorded. Total I/O In: 2922.6 [P.O.:610; I.V.:2222.6; IV Piggyback:90] Out: 2200 [Urine:2200]  FHT:  FHR: 115 bpm, variability: moderate,  accelerations:  Present,  decelerations:  Absent UC:   regular, every 3 minutes SVE:   Dilation: 5 Effacement (%): 60 Station: -2 Exam by:: [email or web address] RN  Labs: Lab Results  Component Value Date   WBC 7.2 04/27/2021   HGB 9.4 (L) 04/27/2021   HCT 28.9 (L) 04/27/2021   MCV 92.3 04/27/2021   PLT 276 04/27/2021    Assessment / Plan: Induction of labor due to GHTN ,  progressing well on pitocin S/P foley balloon  Labor: Progressing normally Preeclampsia:  on magnesium sulfate Fetal Wellbeing:  Category I Pain Control:  IV pain meds I/D:   GBS positive on PCN Anticipated MOD:   VBAC  04/29/2021 04/27/2021, 3:49 PM

## 2021-04-27 NOTE — Anesthesia Preprocedure Evaluation (Deleted)
Anesthesia Evaluation  Patient identified by MRN, date of birth, ID band Patient awake    Reviewed: Allergy & Precautions, Patient's Chart, lab work & pertinent test results  History of Anesthesia Complications Negative for: history of anesthetic complications  Airway Mallampati: II  TM Distance: >3 FB Neck ROM: Full    Dental no notable dental hx. (+) Teeth Intact   Pulmonary neg pulmonary ROS,    Pulmonary exam normal breath sounds clear to auscultation       Cardiovascular hypertension, Pt. on medications Normal cardiovascular exam Rhythm:Regular Rate:Normal     Neuro/Psych  Headaches, negative psych ROS   GI/Hepatic Neg liver ROS, GERD  Medicated and Controlled,  Endo/Other  Obesity  Renal/GU negative Renal ROS  negative genitourinary   Musculoskeletal negative musculoskeletal ROS (+)   Abdominal (+) + obese,   Peds  Hematology  (+) anemia ,   Anesthesia Other Findings   Reproductive/Obstetrics (+) Pregnancy Previous C/Section x 1 Pre Eclampsia on MgSO4                           Anesthesia Physical Anesthesia Plan  ASA: 3  Anesthesia Plan: Epidural   Post-op Pain Management:    Induction:   PONV Risk Score and Plan:   Airway Management Planned: Natural Airway  Additional Equipment:   Intra-op Plan:   Post-operative Plan:   Informed Consent: I have reviewed the patients History and Physical, chart, labs and discussed the procedure including the risks, benefits and alternatives for the proposed anesthesia with the patient or authorized representative who has indicated his/her understanding and acceptance.       Plan Discussed with: Anesthesiologist  Anesthesia Plan Comments:        Anesthesia Quick Evaluation

## 2021-04-27 NOTE — Progress Notes (Signed)
Heather Noble is a 25 y.o. G2P1001 at [redacted]w[redacted]d by 9 week ultrasound admitted for induction of labor due to [facility].  Subjective: Pt comfortable, sleeping. Headache resolved with Reglan and food.   Objective: BP 130/76   Pulse 90   Temp 98.1 F (36.7 C) (Oral)   Resp 20   Ht 5\' 5"  (1.651 m)   Wt 98 kg   LMP 07/21/2020   BMI 35.94 kg/m  I/O last 3 completed shifts: In: 3931 [P.O.:1110; I.V.:2731; IV Piggyback:90] Out: 3100 [Urine:3100] Total I/O In: 90.8 [I.V.:90.8] Out: 0   FHT:  FHR: 140 bpm, variability: moderate,  accelerations:  Present,  decelerations:  Absent UC:   irregular, every 15 minutes SVE:   Dilation: 5 Effacement (%): 50 Station: -3 Exam by:: [email or web address], CNM  Labs: Lab Results  Component Value Date   WBC 7.2 04/27/2021   HGB 9.4 (L) 04/27/2021   HCT 28.9 (L) 04/27/2021   MCV 92.3 04/27/2021   PLT 276 04/27/2021    Assessment / Plan: Induction of labor due to PEC Pitocin off for Pit break x 4 hours  Labor:  Restart Pitocin at this time, consider AROM at future exam.  Preeclampsia:  on magnesium sulfate Fetal Wellbeing:  Category I Pain Control:  Labor support without medications I/D:   GBS positive on PCN Anticipated MOD:   VBAC  04/29/2021 04/27/2021, 8:41 PM

## 2021-04-27 NOTE — Progress Notes (Signed)
Heather Noble is a 25 y.o. G2P1001 at [redacted]w[redacted]d admitted for induction of labor due to Hypertension.  Subjective: Pt breathing with some contractions, reports headache because she has not eaten.  Family in room for support.  Objective: BP (!) 145/94   Pulse 78   Temp 98 F (36.7 C) (Oral)   Resp 16   Ht 5\' 5"  (1.651 m)   Wt 98 kg   LMP 07/21/2020   BMI 35.94 kg/m  No intake/output data recorded. Total I/O In: 2922.6 [P.O.:610; I.V.:2222.6; IV Piggyback:90] Out: 2200 [Urine:2200]  FHT:  FHR: 120 bpm, variability: moderate,  accelerations:  Present,  decelerations:  Absent UC:   irregular, every 2-6 minutes,  SVE:   Dilation: 5 Effacement (%): 60 Station: -2 Exam by:: [email or web address] RN  Labs: Lab Results  Component Value Date   WBC 7.2 04/27/2021   HGB 9.4 (L) 04/27/2021   HCT 28.9 (L) 04/27/2021   MCV 92.3 04/27/2021   PLT 276 04/27/2021    Assessment / Plan: IOL for GHTN with developing PEC with SF intrapartum  Labor:  Inadequate labor currently on 24 mu of Pitocin.  Discussed options with pt including continuing to increase Pitocin, or stopping Pitocin x 3-4 hours for a break, then restarting at a lower dose. Pt desires to stop medication and eat light snack.  Pitocin break to start now. Preeclampsia:  on magnesium sulfate Fetal Wellbeing:  Category I Pain Control:  IV pain meds I/D:   GBS  pos on PCN Anticipated MOD:   VBAC  04/29/2021 04/27/2021, 4:49 PM

## 2021-04-27 NOTE — Progress Notes (Signed)
Heather Noble is a 25 y.o. G2P1001 at [redacted]w[redacted]d admitted for induction of labor due to [name].  Subjective: Reports feels some cramping with contractions.   Objective: BP (!) 140/100   Pulse 73   Temp 98 F (36.7 C) (Oral)   Resp 18   LMP 07/21/2020  No intake/output data recorded. No intake/output data recorded.  FHT:  FHR: 120 bpm, variability: moderate,  accelerations:  Abscent,  decelerations:  Absent UC:   irregular, every 2-4 minutes SVE:   Dilation: Fingertip Effacement (%): 10 Station: -2 Exam by:: Dr. Ephriam Noble  Labs: Lab Results  Component Value Date   WBC 8.0 04/26/2021   HGB 10.3 (L) 04/26/2021   HCT 32.2 (L) 04/26/2021   MCV 93.1 04/26/2021   PLT 316 04/26/2021    Assessment / Plan: Induction of labor due to gHTN,  cervix now fingertip open, Foley balloon placed using speculum guidance and premedication with Fentanyl. Low dose pitocin continue at 8/hr.   gHTN:  labs stable and BP in 140s-150s/90s-100s. Had one 160/96 while in pain. Continue to monitor Fetal Wellbeing:  Category I Pain Control:  IV pain meds I/D:   GBS pos>PCN    Heather Noble 04/27/2021, 4:21 AM

## 2021-04-28 ENCOUNTER — Encounter (HOSPITAL_COMMUNITY): Payer: Self-pay | Admitting: Obstetrics & Gynecology

## 2021-04-28 DIAGNOSIS — O99820 Streptococcus B carrier state complicating pregnancy: Secondary | ICD-10-CM | POA: Diagnosis not present

## 2021-04-28 DIAGNOSIS — O34211 Maternal care for low transverse scar from previous cesarean delivery: Secondary | ICD-10-CM | POA: Diagnosis not present

## 2021-04-28 DIAGNOSIS — O9902 Anemia complicating childbirth: Secondary | ICD-10-CM | POA: Diagnosis not present

## 2021-04-28 DIAGNOSIS — Z3A38 38 weeks gestation of pregnancy: Secondary | ICD-10-CM

## 2021-04-28 DIAGNOSIS — O1414 Severe pre-eclampsia complicating childbirth: Secondary | ICD-10-CM | POA: Diagnosis not present

## 2021-04-28 LAB — COMPREHENSIVE METABOLIC PANEL
ALT: 17 U/L (ref 0–44)
ALT: 17 U/L (ref 0–44)
AST: 22 U/L (ref 15–41)
AST: 24 U/L (ref 15–41)
Albumin: 2.7 g/dL — ABNORMAL LOW (ref 3.5–5.0)
Albumin: 2.9 g/dL — ABNORMAL LOW (ref 3.5–5.0)
Alkaline Phosphatase: 281 U/L — ABNORMAL HIGH (ref 38–126)
Alkaline Phosphatase: 271 U/L — ABNORMAL HIGH (ref 38–126)
Anion gap: 6 (ref 5–15)
Anion gap: 9 (ref 5–15)
BUN: <5 mg/dL — ABNORMAL LOW (ref 6–20)
BUN: <5 mg/dL — ABNORMAL LOW (ref 6–20)
CO2: 26 mmol/L (ref 22–32)
CO2: 24 mmol/L (ref 22–32)
Calcium: 7.7 mg/dL — ABNORMAL LOW (ref 8.9–10.3)
Calcium: 7.5 mg/dL — ABNORMAL LOW (ref 8.9–10.3)
Chloride: 103 mmol/L (ref 98–111)
Chloride: 101 mmol/L (ref 98–111)
Creatinine, Ser: 0.67 mg/dL (ref 0.44–1.00)
Creatinine, Ser: 0.74 mg/dL (ref 0.44–1.00)
GFR, Estimated: >60 mL/min (ref >60)
GFR, Estimated: >60 mL/min (ref >60)
Glucose, Bld: 101 mg/dL — ABNORMAL HIGH (ref 70–99)
Glucose, Bld: 113 mg/dL — ABNORMAL HIGH (ref 70–99)
Potassium: 3.9 mmol/L (ref 3.5–5.1)
Potassium: 3.7 mmol/L (ref 3.5–5.1)
Sodium: 135 mmol/L (ref 135–145)
Sodium: 134 mmol/L — ABNORMAL LOW (ref 135–145)
Total Bilirubin: 1 mg/dL (ref 0.3–1.2)
Total Bilirubin: 1 mg/dL (ref 0.3–1.2)
Total Protein: 6.6 g/dL (ref 6.5–8.1)
Total Protein: 7.2 g/dL (ref 6.5–8.1)

## 2021-04-28 LAB — CBC
HCT: 28.4 % — ABNORMAL LOW (ref 36.0–46.0)
HCT: 31.1 % — ABNORMAL LOW (ref 36.0–46.0)
Hemoglobin: 9.1 g/dL — ABNORMAL LOW (ref 12.0–15.0)
Hemoglobin: 10.2 g/dL — ABNORMAL LOW (ref 12.0–15.0)
MCH: 29.6 pg (ref 26.0–34.0)
MCH: 29.9 pg (ref 26.0–34.0)
MCHC: 32 g/dL (ref 30.0–36.0)
MCHC: 32.8 g/dL (ref 30.0–36.0)
MCV: 92.5 fL (ref 80.0–100.0)
MCV: 91.2 fL (ref 80.0–100.0)
Platelets: 287 10*3/uL (ref 150–400)
Platelets: 302 10*3/uL (ref 150–400)
RBC: 3.07 MIL/uL — ABNORMAL LOW (ref 3.87–5.11)
RBC: 3.41 MIL/uL — ABNORMAL LOW (ref 3.87–5.11)
RDW: 13.2 % (ref 11.5–15.5)
RDW: 13.2 % (ref 11.5–15.5)
WBC: 10.8 10*3/uL — ABNORMAL HIGH (ref 4.0–10.5)
WBC: 13.7 10*3/uL — ABNORMAL HIGH (ref 4.0–10.5)
nRBC: 0 % (ref 0.0–0.2)
nRBC: 0 % (ref 0.0–0.2)

## 2021-04-28 LAB — MAGNESIUM
Magnesium: 5.4 mg/dL — ABNORMAL HIGH (ref 1.7–2.4)
Magnesium: 5.4 mg/dL — ABNORMAL HIGH (ref 1.7–2.4)

## 2021-04-28 MED ORDER — DIBUCAINE (PERIANAL) 1 % EX OINT: 1.0000 "application " | TOPICAL_OINTMENT | CUTANEOUS | Status: DC | PRN

## 2021-04-28 MED ORDER — PRENATAL MULTIVITAMIN CH: 1.0000 | ORAL_TABLET | Freq: Every day | ORAL | Status: DC

## 2021-04-28 MED ORDER — WITCH HAZEL-GLYCERIN EX PADS: 1.0000 "application " | MEDICATED_PAD | CUTANEOUS | Status: DC | PRN

## 2021-04-28 MED ORDER — MISOPROSTOL 200 MCG PO TABS
ORAL_TABLET | ORAL | Status: AC
  Filled 2021-04-28: qty 4

## 2021-04-28 MED ORDER — COCONUT OIL OIL
1.0000 "application " | TOPICAL_OIL | Status: DC | PRN
  Administered 2021-04-28: 1 via TOPICAL

## 2021-04-28 MED ORDER — DIPHENHYDRAMINE HCL 50 MG/ML IJ SOLN: 12.5000 mg | INTRAMUSCULAR | Status: DC | PRN

## 2021-04-28 MED ORDER — METHYLERGONOVINE MALEATE 0.2 MG PO TABS: 0.2000 mg | ORAL_TABLET | ORAL | Status: DC | PRN

## 2021-04-28 MED ORDER — MISOPROSTOL 200 MCG PO TABS
800.0000 ug | ORAL_TABLET | Freq: Once | ORAL | Status: AC
  Administered 2021-04-28: 800 ug via RECTAL

## 2021-04-28 MED ORDER — ONDANSETRON HCL 4 MG PO TABS: 4.0000 mg | ORAL_TABLET | ORAL | Status: DC | PRN

## 2021-04-28 MED ORDER — MAGNESIUM SULFATE 40 GM/1000ML IV SOLN
2.0000 g/h | INTRAVENOUS | Status: AC
  Administered 2021-04-28: 18:00:00 2 g/h via INTRAVENOUS
  Filled 2021-04-28: qty 1000

## 2021-04-28 MED ORDER — SIMETHICONE 80 MG PO CHEW: 80.0000 mg | CHEWABLE_TABLET | ORAL | Status: DC | PRN

## 2021-04-28 MED ORDER — LABETALOL HCL 200 MG PO TABS
200.0000 mg | ORAL_TABLET | Freq: Two times a day (BID) | ORAL | Status: DC
  Administered 2021-04-28: 200 mg via ORAL
  Filled 2021-04-28: qty 1

## 2021-04-28 MED ORDER — TETANUS-DIPHTH-ACELL PERTUSSIS 5-2.5-18.5 LF-MCG/0.5 IM SUSY: 0.5000 mL | PREFILLED_SYRINGE | Freq: Once | INTRAMUSCULAR | Status: DC

## 2021-04-28 MED ORDER — LACTATED RINGERS IV SOLN: 500.0000 mL | Freq: Once | INTRAVENOUS | Status: DC

## 2021-04-28 MED ORDER — TRANEXAMIC ACID-NACL 1000-0.7 MG/100ML-% IV SOLN
INTRAVENOUS | Status: AC
  Filled 2021-04-28: qty 100

## 2021-04-28 MED ORDER — DOCUSATE SODIUM 100 MG PO CAPS
100.0000 mg | ORAL_CAPSULE | Freq: Two times a day (BID) | ORAL | Status: DC
  Administered 2021-04-28 – 2021-04-30 (×4): 100 mg via ORAL
  Filled 2021-04-28 (×4): qty 1

## 2021-04-28 MED ORDER — IBUPROFEN 600 MG PO TABS
600.0000 mg | ORAL_TABLET | Freq: Four times a day (QID) | ORAL | Status: DC
  Administered 2021-04-28 – 2021-04-30 (×7): 600 mg via ORAL
  Filled 2021-04-28 (×7): qty 1

## 2021-04-28 MED ORDER — METHYLERGONOVINE MALEATE 0.2 MG/ML IJ SOLN: 0.2000 mg | INTRAMUSCULAR | Status: DC | PRN

## 2021-04-28 MED ORDER — FERROUS SULFATE 325 (65 FE) MG PO TABS
325.0000 mg | ORAL_TABLET | ORAL | Status: DC
  Administered 2021-04-29: 325 mg via ORAL
  Filled 2021-04-28 (×2): qty 1

## 2021-04-28 MED ORDER — ACETAMINOPHEN 325 MG PO TABS
650.0000 mg | ORAL_TABLET | ORAL | Status: DC | PRN
  Administered 2021-04-28 – 2021-04-30 (×3): 650 mg via ORAL
  Filled 2021-04-28 (×3): qty 2

## 2021-04-28 MED ORDER — FLEET ENEMA 7-19 GM/118ML RE ENEM: 1.0000 | ENEMA | Freq: Every day | RECTAL | Status: DC | PRN

## 2021-04-28 MED ORDER — ONDANSETRON HCL 4 MG/2ML IJ SOLN: 4.0000 mg | INTRAMUSCULAR | Status: DC | PRN

## 2021-04-28 MED ORDER — PHENYLEPHRINE 40 MCG/ML (10ML) SYRINGE FOR IV PUSH (FOR BLOOD PRESSURE SUPPORT): 80.0000 ug | PREFILLED_SYRINGE | INTRAVENOUS | Status: DC | PRN

## 2021-04-28 MED ORDER — EPHEDRINE 5 MG/ML INJ: 10.0000 mg | INTRAVENOUS | Status: DC | PRN

## 2021-04-28 MED ORDER — DIPHENHYDRAMINE HCL 25 MG PO CAPS: 25.0000 mg | ORAL_CAPSULE | Freq: Four times a day (QID) | ORAL | Status: DC | PRN

## 2021-04-28 MED ORDER — FENTANYL-BUPIVACAINE-NACL 0.5-0.125-0.9 MG/250ML-% EP SOLN: 12.0000 mL/h | EPIDURAL | Status: DC | PRN

## 2021-04-28 MED ORDER — PRENATAL MULTIVITAMIN CH
1.0000 | ORAL_TABLET | Freq: Every day | ORAL | Status: DC
  Administered 2021-04-29 – 2021-04-30 (×2): 1 via ORAL
  Filled 2021-04-28 (×3): qty 1

## 2021-04-28 MED ORDER — MEASLES, MUMPS & RUBELLA VAC IJ SOLR: 0.5000 mL | Freq: Once | INTRAMUSCULAR | Status: DC

## 2021-04-28 MED ORDER — BISACODYL 10 MG RE SUPP
10.0000 mg | Freq: Every day | RECTAL | Status: DC | PRN
  Filled 2021-04-28: qty 1

## 2021-04-28 MED ORDER — LACTATED RINGERS IV SOLN: INTRAVENOUS | Status: AC

## 2021-04-28 MED ORDER — TRANEXAMIC ACID-NACL 1000-0.7 MG/100ML-% IV SOLN
1000.0000 mg | INTRAVENOUS | Status: AC
  Administered 2021-04-28: 1000 mg via INTRAVENOUS

## 2021-04-28 MED ORDER — BENZOCAINE-MENTHOL 20-0.5 % EX AERO
1.0000 "application " | INHALATION_SPRAY | CUTANEOUS | Status: DC | PRN
  Administered 2021-04-28: 1 via TOPICAL
  Filled 2021-04-28: qty 56

## 2021-04-28 MED ORDER — MEDROXYPROGESTERONE ACETATE 150 MG/ML IM SUSP: 150.0000 mg | INTRAMUSCULAR | Status: DC | PRN

## 2021-04-28 NOTE — Lactation Note (Signed)
This note was copied from a baby's chart. Lactation Consultation Note  Patient Name: Heather Noble [id number] Date: 04/28/2021 Reason for consult: Initial assessment Age:26 hours  LC contacted Meghan Hopenbauer-RN regarding LC visit. RN states DEBP has been set up in the room. Per RN, mother has received several medications and does not seem awake enough for consult at this time. Mother will be proably ready for consult tomorrow 04/29/2021.   Consult Status Consult Status: Follow-up Date: 04/29/21 Follow-up type: In-patient    Heather Noble 04/28/2021, 3:46 PM

## 2021-04-28 NOTE — Lactation Note (Signed)
This note was copied from a baby's chart. Lactation Consultation Note  Patient Name: Heather Noble [id number] Date: 04/28/2021 Reason for consult: Initial assessment;Mother's request;Difficult latch;Term;Breastfeeding assistance;Other (Comment) (GHTN - mag, labetalol, Anemia) Age:26 hours  LC assisted with latching with signs of milk transfer.   Plan 1. To feed based on cues 8-12x 24hr period. Mom to offer breasts and look for signs of milk transfer.  2. Supplement with EBM first followed by formula with pace bottle feeding and slow flow nipple. If infant not latching, parents aware to offer more.  3. DEBP q 3hrs for 15 min  4. I and O sheet reviewed.  All questions answered at the end of the visit.   Maternal Data Has patient been taught Hand Expression?: Yes Does the patient have breastfeeding experience prior to this delivery?: No  Feeding Mother's Current Feeding Choice: Breast Milk and Formula  LATCH Score Latch: Repeated attempts needed to sustain latch, nipple held in mouth throughout feeding, stimulation needed to elicit sucking reflex.  Audible Swallowing: Spontaneous and intermittent  Type of Nipple: Everted at rest and after stimulation  Comfort (Breast/Nipple): Soft / non-tender  Hold (Positioning): Assistance needed to correctly position infant at breast and maintain latch.  LATCH Score: 8   Lactation Tools Discussed/Used Tools: Flanges;Pump Flange Size: 24 Breast pump type: Double-Electric Breast Pump Pump Education: Setup, frequency, and cleaning;Milk Storage Reason for Pumping: increase stimulation Pumping frequency: every 3 hrs for  Interventions Interventions: Breast feeding basics reviewed;Support pillows;Education;Pace feeding;Position options;Assisted with latch;Skin to skin;Expressed milk;LC Services brochure;Breast massage;Hand express;Infant Driven Feeding Algorithm education;DEBP;Breast compression;Adjust position  Discharge Pump:  Manual WIC Program: No  Consult Status Consult Status: Follow-up Date: 04/29/21 Follow-up type: In-patient    Heather Noble  Heather Noble 04/28/2021, 8:46 PM

## 2021-04-28 NOTE — Progress Notes (Signed)
OB PN:  S: Starting to feel more painful contractions  O: BP (!) 147/91   Pulse 78   Temp 98.2 F (36.8 C) (Oral)   Resp 19   Ht 5\' 5"  (1.651 m)   Wt 98 kg   LMP 07/21/2020   BMI 35.94 kg/m   FHT: 120bpm, moderate variablity, + accels, no decels Toco: irregular SVE: 5/50/-3, ballotable  Results for orders placed or performed during the hospital encounter of 04/26/21 (from the past 24 hour(s))  Comprehensive metabolic panel     Status: Abnormal   Collection Time: 04/27/21  6:42 AM  Result Value Ref Range   Sodium 134 (L) 135 - 145 mmol/L   Potassium 3.2 (L) 3.5 - 5.1 mmol/L   Chloride 103 98 - 111 mmol/L   CO2 23 22 - 32 mmol/L   Glucose, Bld 97 70 - 99 mg/dL   BUN 7 6 - 20 mg/dL   Creatinine, Ser 04/29/21 0.44 - 1.00 mg/dL   Calcium 8.5 (L) 8.9 - 10.3 mg/dL   Total Protein 6.6 6.5 - 8.1 g/dL   Albumin 2.7 (L) 3.5 - 5.0 g/dL   AST 28 15 - 41 U/L   ALT 16 0 - 44 U/L   Alkaline Phosphatase 271 (H) 38 - 126 U/L   Total Bilirubin 0.9 0.3 - 1.2 mg/dL   GFR, Estimated 1.51 >76 mL/min   Anion gap 8 5 - 15  CBC     Status: Abnormal   Collection Time: 04/27/21  6:42 AM  Result Value Ref Range   WBC 7.2 4.0 - 10.5 K/uL   RBC 3.13 (L) 3.87 - 5.11 MIL/uL   Hemoglobin 9.4 (L) 12.0 - 15.0 g/dL   HCT 04/29/21 (L) 07.3 - 71.0 %   MCV 92.3 80.0 - 100.0 fL   MCH 30.0 26.0 - 34.0 pg   MCHC 32.5 30.0 - 36.0 g/dL   RDW 62.6 94.8 - 54.6 %   Platelets 276 150 - 400 K/uL   nRBC 0.0 0.0 - 0.2 %  Protein / creatinine ratio, urine     Status: Abnormal   Collection Time: 04/27/21  7:24 AM  Result Value Ref Range   Creatinine, Urine 47.12 mg/dL   Total Protein, Urine 14 mg/dL   Protein Creatinine Ratio 0.30 (H) 0.00 - 0.15 mg/mg[Cre]  CBC     Status: Abnormal   Collection Time: 04/28/21 12:05 AM  Result Value Ref Range   WBC 10.8 (H) 4.0 - 10.5 K/uL   RBC 3.07 (L) 3.87 - 5.11 MIL/uL   Hemoglobin 9.1 (L) 12.0 - 15.0 g/dL   HCT 04/30/21 (L) 35.0 - 09.3 %   MCV 92.5 80.0 - 100.0 fL   MCH 29.6  26.0 - 34.0 pg   MCHC 32.0 30.0 - 36.0 g/dL   RDW 81.8 29.9 - 37.1 %   Platelets 287 150 - 400 K/uL   nRBC 0.0 0.0 - 0.2 %  Comprehensive metabolic panel     Status: Abnormal   Collection Time: 04/28/21 12:05 AM  Result Value Ref Range   Sodium 135 135 - 145 mmol/L   Potassium 3.9 3.5 - 5.1 mmol/L   Chloride 103 98 - 111 mmol/L   CO2 26 22 - 32 mmol/L   Glucose, Bld 101 (H) 70 - 99 mg/dL   BUN <5 (L) 6 - 20 mg/dL   Creatinine, Ser 04/30/21 0.44 - 1.00 mg/dL   Calcium 7.7 (L) 8.9 - 10.3 mg/dL   Total Protein 6.6 6.5 -  8.1 g/dL   Albumin 2.7 (L) 3.5 - 5.0 g/dL   AST 22 15 - 41 U/L   ALT 17 0 - 44 U/L   Alkaline Phosphatase 281 (H) 38 - 126 U/L   Total Bilirubin 1.0 0.3 - 1.2 mg/dL   GFR, Estimated >13 >88 mL/min   Anion gap 6 5 - 15  Magnesium     Status: Abnormal   Collection Time: 04/28/21 12:05 AM  Result Value Ref Range   Magnesium 5.4 (H) 1.7 - 2.4 mg/dL     A/P: 25 y.o. [id number] @ [redacted]w[redacted]d for IOL due to gHTN now with preeclampsia with severe features.  1. FWB: Cat. I 2. Labor: continue Pitocin.  Once head better engaged, will plan for AROM Pain: continue IV pain medication prn GBS: positive, PCN in labor  3. Preeclampsia with severe features -still noting intermittent headaches -labs stable as above -continue Magnesium   Myna Hidalgo, DO [phone] (cell) [phone] (office)

## 2021-04-28 NOTE — Lactation Note (Signed)
This note was copied from a baby's chart. Lactation Consultation Note  Patient Name: Heather Noble Date: 04/28/2021   Age:26 hours LC arrived to assist with latching. Mom needed RN assistance to go to the restroom. LC to return.   Maternal Data    Feeding    LATCH Score                    Lactation Tools Discussed/Used    Interventions    Discharge    Consult Status      Kaina Orengo  [name] 04/28/2021, 8:04 PM

## 2021-04-28 NOTE — Progress Notes (Signed)
OB PN:  S: Continues to feel painful contractions  O: BP (!) 154/97   Pulse 92   Temp 99 F (37.2 C) (Oral)   Resp 20   Ht 5\' 5"  (1.651 m)   Wt 98 kg   LMP 07/21/2020   SpO2 100%   BMI 35.94 kg/m   FHT: 120bpm, moderate variablity, + accels, no decels Toco: q3-42min SVE: 6/70/-2, AROM light meconium  Results for orders placed or performed during the hospital encounter of 04/26/21 (from the past 24 hour(s))  Comprehensive metabolic panel     Status: Abnormal   Collection Time: 04/27/21  6:42 AM  Result Value Ref Range   Sodium 134 (L) 135 - 145 mmol/L   Potassium 3.2 (L) 3.5 - 5.1 mmol/L   Chloride 103 98 - 111 mmol/L   CO2 23 22 - 32 mmol/L   Glucose, Bld 97 70 - 99 mg/dL   BUN 7 6 - 20 mg/dL   Creatinine, Ser 04/29/21 0.44 - 1.00 mg/dL   Calcium 8.5 (L) 8.9 - 10.3 mg/dL   Total Protein 6.6 6.5 - 8.1 g/dL   Albumin 2.7 (L) 3.5 - 5.0 g/dL   AST 28 15 - 41 U/L   ALT 16 0 - 44 U/L   Alkaline Phosphatase 271 (H) 38 - 126 U/L   Total Bilirubin 0.9 0.3 - 1.2 mg/dL   GFR, Estimated 1.61 >09 mL/min   Anion gap 8 5 - 15  CBC     Status: Abnormal   Collection Time: 04/27/21  6:42 AM  Result Value Ref Range   WBC 7.2 4.0 - 10.5 K/uL   RBC 3.13 (L) 3.87 - 5.11 MIL/uL   Hemoglobin 9.4 (L) 12.0 - 15.0 g/dL   HCT 04/29/21 (L) 45.4 - 09.8 %   MCV 92.3 80.0 - 100.0 fL   MCH 30.0 26.0 - 34.0 pg   MCHC 32.5 30.0 - 36.0 g/dL   RDW 11.9 14.7 - 82.9 %   Platelets 276 150 - 400 K/uL   nRBC 0.0 0.0 - 0.2 %  Protein / creatinine ratio, urine     Status: Abnormal   Collection Time: 04/27/21  7:24 AM  Result Value Ref Range   Creatinine, Urine 47.12 mg/dL   Total Protein, Urine 14 mg/dL   Protein Creatinine Ratio 0.30 (H) 0.00 - 0.15 mg/mg[Cre]  CBC     Status: Abnormal   Collection Time: 04/28/21 12:05 AM  Result Value Ref Range   WBC 10.8 (H) 4.0 - 10.5 K/uL   RBC 3.07 (L) 3.87 - 5.11 MIL/uL   Hemoglobin 9.1 (L) 12.0 - 15.0 g/dL   HCT 04/30/21 (L) 13.0 - 86.5 %   MCV 92.5 80.0 - 100.0 fL    MCH 29.6 26.0 - 34.0 pg   MCHC 32.0 30.0 - 36.0 g/dL   RDW 78.4 69.6 - 29.5 %   Platelets 287 150 - 400 K/uL   nRBC 0.0 0.0 - 0.2 %  Comprehensive metabolic panel     Status: Abnormal   Collection Time: 04/28/21 12:05 AM  Result Value Ref Range   Sodium 135 135 - 145 mmol/L   Potassium 3.9 3.5 - 5.1 mmol/L   Chloride 103 98 - 111 mmol/L   CO2 26 22 - 32 mmol/L   Glucose, Bld 101 (H) 70 - 99 mg/dL   BUN <5 (L) 6 - 20 mg/dL   Creatinine, Ser 04/30/21 0.44 - 1.00 mg/dL   Calcium 7.7 (L) 8.9 - 10.3 mg/dL  Total Protein 6.6 6.5 - 8.1 g/dL   Albumin 2.7 (L) 3.5 - 5.0 g/dL   AST 22 15 - 41 U/L   ALT 17 0 - 44 U/L   Alkaline Phosphatase 281 (H) 38 - 126 U/L   Total Bilirubin 1.0 0.3 - 1.2 mg/dL   GFR, Estimated >16 >94 mL/min   Anion gap 6 5 - 15  Magnesium     Status: Abnormal   Collection Time: 04/28/21 12:05 AM  Result Value Ref Range   Magnesium 5.4 (H) 1.7 - 2.4 mg/dL     A/P: 25 y.o. [id number] @ [redacted]w[redacted]d for IOL due to gHTN now with preeclampsia with severe features- TOLAC  1. FWB: Cat. I 2. Labor: continue Pit per protocol.  Currently at 22u will 1/2 to 12u AROM light meconium TOLAC- prior C-section for non-reassuring fetal well being  Pain: continue IV pain medication prn GBS: positive, PCN in labor  3. Preeclampsia with severe features -still noting intermittent headaches -labs stable as above, continue monitoring -continue Magnesium   Myna Hidalgo, DO [phone] (cell) [phone] (office)

## 2021-04-28 NOTE — Progress Notes (Signed)
Patient Vitals for the past 4 hrs:  BP Temp Temp src Pulse Resp SpO2  04/28/21 1119 (!) 165/97 -- -- 93 -- --  04/28/21 1101 (!) 162/97 -- -- 92 -- --  04/28/21 1048 (!) 151/89 -- -- 97 16 --  04/28/21 1031 (!) 162/102 -- -- 88 -- --  04/28/21 1001 (!) 142/75 -- -- 76 18 --  04/28/21 0931 (!) 153/94 97.7 F (36.5 C) Oral (!) 101 16 --  04/28/21 0924 (!) 147/101 -- -- (!) 102 -- --  04/28/21 0905 -- -- -- -- -- 98 %  04/28/21 0901 (!) 165/107 -- -- 99 -- --  04/28/21 0831 (!) 160/98 -- -- 82 -- --  04/28/21 0804 (!) 150/88 -- -- 87 18 --   Has received both IV and PO labetalol. Mg at 2gm/hr.  Not wanting an epidural. Cx now 8/90/0 per RN.  FHR 115, Cat 1.  Pitocin at 7mu/min, ctx q 2-3 minutes.  Continue present mgt.

## 2021-04-28 NOTE — Discharge Summary (Signed)
Postpartum Discharge Summary     Patient Name: Heather Noble DOB: Oct 31, 1995 MRN: [id number] OBGYN: Merla Riches  Date of admission: 04/26/2021 Delivery date:04/28/2021  Delivering provider: Christin Fudge  Date of discharge: 04/30/2021  Admitting diagnosis: Pregnancy at 61/5. New onset gestational HTN. H/o c-section.  Secondary diagnosis:  History of migraines. History of anemia. GBS positive  Additional problems: none    Discharge diagnosis: VBAC                                              Augmentation: AROM, Pitocin, and IP Foley Complications: None  Hospital course:  Patient admitted for IOL. She was diagnosed with severe pre-eclampsia based on HA. She delivered on: Membrane Rupture Time/Date: 6:10 AM ,04/28/2021   Delivery Method:VBAC, Spontaneous  Episiotomy: None  Lacerations:  2nd degree  Details of delivery can be found in separate delivery note.    She had an uncomplicated PP course. She had 24 hours PP Mg. Patient is discharged home 04/30/21.  Newborn Data: Birth date:04/28/2021  Birth time:12:17 PM  Gender:Female  Living status:Living  Apgars:6 ,8  Weight:3314 g    Physical exam  Vitals:   04/29/21 0807 04/29/21 1334 04/29/21 1942 04/29/21 2336  BP: 130/62 123/76 138/69 128/63  Pulse: 87 99 87 88  Resp:   18 17  Temp:  98 F (36.7 C) 98.3 F (36.8 C) 97.7 F (36.5 C)  TempSrc:  Oral Oral Oral  SpO2:   100% 100%  Weight:      Height:       General: alert Lochia: appropriate Uterine Fundus: firm, nttp Incision: deferred DVT Evaluation: No evidence of DVT seen on physical exam. Labs: Lab Results  Component Value Date   WBC 13.7 (H) 04/28/2021   HGB 10.2 (L) 04/28/2021   HCT 31.1 (L) 04/28/2021   MCV 91.2 04/28/2021   PLT 302 04/28/2021   CMP Latest Ref Rng & Units 04/28/2021  Glucose 70 - 99 mg/dL 113(H)  BUN 6 - 20 mg/dL <5(L)  Creatinine 0.44 - 1.00 mg/dL 0.74  Sodium 135 - 145 mmol/L 134(L)  Potassium 3.5 - 5.1 mmol/L  3.7  Chloride 98 - 111 mmol/L 101  CO2 22 - 32 mmol/L 24  Calcium 8.9 - 10.3 mg/dL 7.5(L)  Total Protein 6.5 - 8.1 g/dL 7.2  Total Bilirubin 0.3 - 1.2 mg/dL 1.0  Alkaline Phos 38 - 126 U/L 271(H)  AST 15 - 41 U/L 24  ALT 0 - 44 U/L 17   A POS  Edinburgh Score: Edinburgh Postnatal Depression Scale Screening Tool 04/29/2021  I have been able to laugh and see the funny side of things. (No Data)  I have looked forward with enjoyment to things. -  I have blamed myself unnecessarily when things went wrong. -  I have been anxious or worried for no good reason. -  I have felt scared or panicky for no good reason. -  Things have been getting on top of me. -  I have been so unhappy that I have had difficulty sleeping. -  I have felt sad or miserable. -  I have been so unhappy that I have been crying. -  The thought of harming myself has occurred to me. Flavia Shipper Postnatal Depression Scale Total -     After visit meds:  Allergies as of 04/30/2021   No  Known Allergies      Medication List     STOP taking these medications    aspirin EC 81 MG tablet   ferrous sulfate 324 (65 Fe) MG Tbec       TAKE these medications    Blood Pressure Kit Devi 1 kit by Does not apply route once a week.   [facility] Prenatal Gummy 0.4-25 MG Chew Chew 3 tablets by mouth daily.   famotidine 20 MG tablet Commonly known as: PEPCID Take 1 tablet (20 mg total) by mouth at bedtime.   ibuprofen 600 MG tablet Commonly known as: ADVIL Take 1 tablet (600 mg total) by mouth every 6 (six) hours as needed.   ondansetron 4 MG disintegrating tablet Commonly known as: Zofran ODT Take 1 tablet (4 mg total) by mouth every 8 (eight) hours as needed for nausea or vomiting.         Discharge home in stable condition Infant Feeding: Breast Infant Disposition:home with mother Discharge instruction: per After Visit Summary and Postpartum booklet. Activity: Advance as tolerated. Pelvic rest for 6 weeks.   Diet: routine diet Future Appointments: Future Appointments  Date Time Provider [place]  05/02/2021  8:00 AM [id number] None  05/04/2021  2:30 PM Shelly Bombard, MD [place] None   Follow up Visit:   Please schedule this patient for a In person postpartum visit in 4 weeks with the following provider: Any provider. Additional Postpartum F/U:BP check 1 week  Low risk pregnancy complicated by: HTN Delivery mode:  VBAC, Spontaneous  Anticipated Birth Control:   delcines   04/30/2021 Aletha Halim, MD

## 2021-04-29 NOTE — Progress Notes (Signed)
OB Note D/w mom and dad re: circ r/b and they would like to proceed. Will try and do today if okay with peds  Cornelia Copa MD Attending Center for [facility] (Faculty Practice) 04/29/2021 Time: [phone]

## 2021-04-29 NOTE — Progress Notes (Signed)
Daily Postpartum Note  Admission Date: 04/26/2021 Current Date: 04/29/2021 10:10 AM  Heather Noble is a 25 y.o. [id number] PPD#1 VBAC/2nd , admitted for IOL for gHTN and patient dx with severe pre-eclampsia (BPs) during labor.  Pregnancy complicated by: Patient Active Problem List   Diagnosis Date Noted   Gestational hypertension 04/26/2021   GBS (group B Streptococcus carrier), +RV culture, currently pregnant 04/16/2021   Encounter for supervision of normal pregnancy, antepartum 09/29/2020   History of gestational hypertension 04/21/2017   Sickle cell trait ([place]) 11/03/2016   Rubella non-immune status, antepartum 11/03/2016   Anemia in pregnancy 11/03/2016   S/P cesarean section 10/16/2016   Migraine with aura and without status migrainosus, not intractable 10/16/2014    Overnight/24hr events:  none  Subjective:  Doing well. No issues.   Objective:    Current Vital Signs 24h Vital Sign Ranges  T 98.9 F (37.2 C) Temp  Avg: 98.5 F (36.9 C)  Min: 97.7 F (36.5 C)  Max: 98.9 F (37.2 C)  BP (!) 118/59  BP  Min: 108/63  Max: 165/97  HR 88 Pulse  Avg: 93.8  Min: 79  Max: 139  RR 17 Resp  Avg: 17.2  Min: 15  Max: 20  SaO2 100 % Room Air SpO2  Avg: 99.7 %  Min: 99 %  Max: 100 %       24 Hour I/O Current Shift I/O  Time Ins Outs 11/24 0701 - 11/25 0700 In: 3504.4 [P.O.:780; I.V.:2444.4] Out: 3200 [Urine:2550] No intake/output data recorded.   Patient Vitals for the past 24 hrs:  BP Temp Temp src Pulse Resp SpO2  04/29/21 0605 -- -- -- -- 17 --  04/29/21 0500 -- -- -- -- 17 --  04/29/21 0404 (!) 118/59 98.9 F (37.2 C) Oral 88 18 100 %  04/29/21 0300 -- -- -- -- 17 --  04/29/21 0200 -- -- -- -- 17 --  04/29/21 0100 -- -- -- -- 15 --  04/29/21 0008 (!) 141/62 98.9 F (37.2 C) Oral 92 18 100 %  04/28/21 2300 -- -- -- -- 18 --  04/28/21 2200 -- -- -- -- 18 --  04/28/21 2102 125/65 97.7 F (36.5 C) Oral 91 17 --  04/28/21 2000 -- -- -- -- 18 --  04/28/21 1700  124/73 -- -- 85 -- --  04/28/21 1445 -- -- -- -- -- 100 %  04/28/21 1444 114/65 98.3 F (36.8 C) Oral 79 16 100 %  04/28/21 1420 121/72 -- -- 88 -- --  04/28/21 1401 108/63 -- -- 91 -- --  04/28/21 1346 112/72 -- -- 93 16 --  04/28/21 1331 119/73 -- -- 92 -- --  04/28/21 1315 124/82 -- -- 92 -- --  04/28/21 1300 123/84 -- -- 93 -- 99 %  04/28/21 1248 (!) 141/93 -- -- 97 -- 99 %  04/28/21 1230 (!) 140/119 -- -- (!) 139 -- --  04/28/21 1146 138/85 -- -- 98 20 --  04/28/21 1119 (!) 165/97 -- -- 93 -- --  04/28/21 1101 (!) 162/97 -- -- 92 -- --  04/28/21 1048 (!) 151/89 -- -- 97 16 --  04/28/21 1031 (!) 162/102 -- -- 88 -- --   UOP: >198mL/hr  Physical exam: General: Well nourished, well developed female in no acute distress. Abdomen: nttp Cardiovascular: S1, S2 normal, no murmur, rub or gallop, regular rate and rhythm Respiratory: CTAB Extremities: no clubbing, cyanosis or edema Skin: Warm and dry.   Medications: Current Facility-Administered  Medications  Medication Dose Route Frequency Provider Last Rate Last Admin   acetaminophen (TYLENOL) tablet 650 mg  650 mg Oral Q4H PRN Christin Fudge, CNM   650 mg at 04/29/21 0330   benzocaine-Menthol (DERMOPLAST) 20-0.5 % topical spray 1 application  1 application Topical PRN Christin Fudge, CNM   1 application at 41/93/79 1447   bisacodyl (DULCOLAX) suppository 10 mg  10 mg Rectal Daily PRN Cresenzo-Dishmon, Joaquim Lai, CNM       coconut oil  1 application Topical PRN Christin Fudge, CNM   1 application at 02/40/97 1434   witch hazel-glycerin (TUCKS) pad 1 application  1 application Topical PRN Cresenzo-Dishmon, Joaquim Lai, CNM       And   dibucaine (NUPERCAINAL) 1 % rectal ointment 1 application  1 application Rectal PRN Cresenzo-Dishmon, Joaquim Lai, CNM       diphenhydrAMINE (BENADRYL) capsule 25 mg  25 mg Oral Q6H PRN Cresenzo-Dishmon, Joaquim Lai, CNM       docusate sodium (COLACE) capsule 100 mg  100 mg Oral BID  Christin Fudge, CNM   100 mg at 04/29/21 3532   ferrous sulfate tablet 325 mg  325 mg Oral QODAY Cresenzo-DishmonJoaquim Lai, CNM   325 mg at 04/29/21 9924   ibuprofen (ADVIL) tablet 600 mg  600 mg Oral Q6H Cresenzo-Dishmon, Joaquim Lai, CNM   600 mg at 04/29/21 2683   lactated ringers infusion   Intravenous Continuous Christin Fudge, CNM 25 mL/hr at 04/28/21 1237 Rate Change at 04/28/21 1237   lactated ringers infusion   Intravenous Continuous Florian Buff, MD       magnesium sulfate 40 grams in SWI 1000 mL OB infusion  2 g/hr Intravenous Continuous Christin Fudge, CNM 50 mL/hr at 04/28/21 1758 2 g/hr at 04/28/21 1758   measles, mumps & rubella vaccine (MMR) injection 0.5 mL  0.5 mL Subcutaneous Once Cresenzo-Dishmon, Joaquim Lai, CNM       medroxyPROGESTERone (DEPO-PROVERA) injection 150 mg  150 mg Intramuscular Prior to discharge Christin Fudge, CNM       methylergonovine (METHERGINE) tablet 0.2 mg  0.2 mg Oral Q4H PRN Christin Fudge, CNM       Or   methylergonovine (METHERGINE) injection 0.2 mg  0.2 mg Intramuscular Q4H PRN Cresenzo-Dishmon, Joaquim Lai, CNM       ondansetron [facility]) tablet 4 mg  4 mg Oral Q4H PRN Cresenzo-Dishmon, Joaquim Lai, CNM       Or   ondansetron (ZOFRAN) injection 4 mg  4 mg Intravenous Q4H PRN Cresenzo-Dishmon, Joaquim Lai, CNM       prenatal multivitamin tablet 1 tablet  1 tablet Oral Q1200 Christin Fudge, CNM   1 tablet at 04/29/21 4196   simethicone (MYLICON) chewable tablet 80 mg  80 mg Oral PRN Cresenzo-Dishmon, Joaquim Lai, CNM       sodium phosphate (FLEET) 7-19 GM/118ML enema 1 enema  1 enema Rectal Daily PRN Cresenzo-Dishmon, Joaquim Lai, CNM       Tdap (BOOSTRIX) injection 0.5 mL  0.5 mL Intramuscular Once Christin Fudge, CNM        Labs:  Recent Labs  Lab 04/27/21 2229 04/28/21 0005 04/28/21 0857  WBC 7.2 10.8* 13.7*  HGB 9.4* 9.1* 10.2*  HCT 28.9* 28.4* 31.1*  PLT 276 287 302    Recent Labs   Lab 04/27/21 0642 04/28/21 0005 04/28/21 0857  NA 134* 135 134*  K 3.2* 3.9 3.7  CL 103 103 101  CO2 $Re'23 26 24  'iii$ BUN 7 <5* <5*  CREATININE 0.66 0.67 0.74  CALCIUM 8.5* 7.7* 7.5*  PROT  6.6 6.6 7.2  BILITOT 0.9 1.0 1.0  ALKPHOS 271* 281* 271*  ALT $Re'16 17 17  'pYM$ AST $R'28 22 24  'Vn$ GLUCOSE 97 101* 113*     Radiology:  No new imaging  Assessment & Plan:  Pt doing well *PP: routine care. A POS. Consented for circ. Breast. F/u birth control tomorrow *Severe pre-eclampsia: doing well on no meds. Mg off at noon *PPx: SCDs *Dispo: likely tomorrow. Request made for one week bp check  Durene Romans MD Attending Center for [facility]) [place] Phone: [phone] (M-F, [phone]) & [phone]  (Off hours, weekends, holidays)

## 2021-04-29 NOTE — Lactation Note (Signed)
This note was copied from a baby's chart. Lactation Consultation Note  Patient Name: Heather Noble [id number] Date: 04/29/2021 Reason for consult: Follow-up assessment;1st time breastfeeding;Term Age:26 hours   P2 mother whose infant is now 51 hours old.  This is a term baby at 39+0 weeks.  Mother did not breast feed her first child.  Mother's feeding preference is breast/formula.  Baby's bilirubin level was 0.7 mg/dl at 24 hours of life (high risk zone).  He was started on phototherapy using a biliblanket.  Mother was on magnesium sulfate; discontinued at 1200 today.  Discussed current feeding plan with mother.  Encouraged to feed at least every three hours due to bilirubin level and sooner if baby shows feeding cues.  Suggested she increase her supplementation volumes and to always latch to the breast prior to giving supplementation.  Mother reported a good feeding today; baby is beginning to awaken easier now.  Explained how mother can breast feed and still place the biliblanket to baby's back after obtaining a good latch.  Mother verbalized understanding.  Suggested mother call her RN/LC for latch assistance or if she has any difficulty feeding baby.    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  She does not currently participate but would like to apply.  She will call on Monday to discuss with the [facility] team members.  Support person present.     Maternal Data Has patient been taught Hand Expression?: Yes Does the patient have breastfeeding experience prior to this delivery?: No  Feeding Mother's Current Feeding Choice: Breast Milk and Formula  LATCH Score                    Lactation Tools Discussed/Used Tools: Pump;Flanges Flange Size: 24 Breast pump type: Double-Electric Breast Pump;Manual Pump Education: Setup, frequency, and cleaning;Milk Storage Reason for Pumping: Stimulation for supplementation; baby with hyperbilirubinemia Pumping frequency: Every  three hours  Interventions Interventions: Breast feeding basics reviewed;Education  Discharge Pump: DEBP;Manual;Personal (Mother interested in applying for [facility]) WIC Program: No  Consult Status Consult Status: Follow-up Date: 04/30/21 Follow-up type: In-patient    Heather Noble 04/29/2021, 5:57 PM

## 2021-04-30 ENCOUNTER — Telehealth: Payer: Self-pay | Admitting: Obstetrics & Gynecology

## 2021-04-30 ENCOUNTER — Ambulatory Visit: Payer: Self-pay

## 2021-04-30 MED ORDER — IBUPROFEN 600 MG PO TABS: 600.0000 mg | ORAL_TABLET | Freq: Four times a day (QID) | ORAL | 0 refills | Status: DC | PRN

## 2021-04-30 NOTE — Telephone Encounter (Signed)
Meds ordered this encounter  Medications   ibuprofen (ADVIL) 600 MG tablet    Sig: Take 1 tablet (600 mg total) by mouth every 6 (six) hours as needed.    Dispense:  30 tablet    Refill:  0

## 2021-04-30 NOTE — Plan of Care (Signed)
  Problem: Education: Goal: Knowledge of General Education information will improve Description: Including pain rating scale, medication(s)/side effects and non-pharmacologic comfort measures Outcome: Completed/Met   Problem: Health Behavior/Discharge Planning: Goal: Ability to manage health-related needs will improve Outcome: Completed/Met   Problem: Clinical Measurements: Goal: Ability to maintain clinical measurements within normal limits will improve Outcome: Completed/Met Goal: Will remain free from infection Outcome: Completed/Met Goal: Diagnostic test results will improve Outcome: Completed/Met Goal: Respiratory complications will improve Outcome: Completed/Met Goal: Cardiovascular complication will be avoided Outcome: Completed/Met   Problem: Activity: Goal: Risk for activity intolerance will decrease Outcome: Completed/Met   Problem: Nutrition: Goal: Adequate nutrition will be maintained Outcome: Completed/Met   Problem: Coping: Goal: Level of anxiety will decrease Outcome: Completed/Met   Problem: Elimination: Goal: Will not experience complications related to bowel motility Outcome: Completed/Met Goal: Will not experience complications related to urinary retention Outcome: Completed/Met   Problem: Pain Managment: Goal: General experience of comfort will improve Outcome: Completed/Met   Problem: Safety: Goal: Ability to remain free from injury will improve Outcome: Completed/Met   Problem: Skin Integrity: Goal: Risk for impaired skin integrity will decrease Outcome: Completed/Met   Problem: Education: Goal: Knowledge of Childbirth will improve Outcome: Completed/Met Goal: Ability to make informed decisions regarding treatment and plan of care will improve Outcome: Completed/Met Goal: Ability to state and carry out methods to decrease the pain will improve Outcome: Completed/Met Goal: Individualized Educational Video(s) Outcome: Completed/Met    Problem: Coping: Goal: Ability to verbalize concerns and feelings about labor and delivery will improve Outcome: Completed/Met   Problem: Life Cycle: Goal: Ability to make normal progression through stages of labor will improve Outcome: Completed/Met Goal: Ability to effectively push during vaginal delivery will improve Outcome: Completed/Met   Problem: Role Relationship: Goal: Will demonstrate positive interactions with the child Outcome: Completed/Met   Problem: Safety: Goal: Risk of complications during labor and delivery will decrease Outcome: Completed/Met   Problem: Pain Management: Goal: Relief or control of pain from uterine contractions will improve Outcome: Completed/Met   Problem: Education: Goal: Knowledge of disease or condition will improve Outcome: Completed/Met Goal: Knowledge of the prescribed therapeutic regimen will improve Outcome: Completed/Met   Problem: Fluid Volume: Goal: Peripheral tissue perfusion will improve Outcome: Completed/Met   Problem: Clinical Measurements: Goal: Complications related to disease process, condition or treatment will be avoided or minimized Outcome: Completed/Met   

## 2021-04-30 NOTE — Lactation Note (Signed)
This note was copied from a baby's chart. Lactation Consultation Note  Patient Name: Heather Noble [id number] Date: 04/30/2021 Reason for consult: Follow-up assessment;Mother's request;Nipple pain/trauma;Hyperbilirubinemia;RN request;Term Age:26 hours  LC alerted by RN Mom nipples sore from last latch. LC provided breast shells for soreness, Mom using coconut oil before pumping. Mom denied pain with current flange size.   LC also reviewed with mother use of 20 NS how to apply it.  Mom to call for RN or LC for latch assistance for next feeding to ensure has enough depth and infant not latching shallow.  Mom nipples sore but no abrasions noted.   Maternal Data    Feeding Mother's Current Feeding Choice: Breast Milk and Formula  LATCH Score Latch: Grasps breast easily, tongue down, lips flanged, rhythmical sucking.  Audible Swallowing: Spontaneous and intermittent  Type of Nipple: Everted at rest and after stimulation  Comfort (Breast/Nipple): Filling, red/small blisters or bruises, mild/mod discomfort (tender)  Hold (Positioning): Assistance needed to correctly position infant at breast and maintain latch.  LATCH Score: 8   Lactation Tools Discussed/Used Tools: Shells;Flanges Breast pump type: Double-Electric Breast Pump (Mom denied having pain with current flange size. Mom stated work on getting on pumping regimen with DEBP q 3hrs for after latching.) Pumping frequency: every 3 hrs for 15 min  Interventions Interventions: Breast feeding basics reviewed;DEBP;Education;Expressed milk;Pace feeding;Coconut oil;Shells  Discharge    Consult Status Consult Status: Follow-up Date: 05/01/21 Follow-up type: In-patient    Heather Noble  [name] 04/30/2021, 4:29 PM

## 2021-04-30 NOTE — Lactation Note (Signed)
This note was copied from a baby's chart. Lactation Consultation Note  Patient Name: Heather Noble Date: 04/30/2021 Reason for consult: Follow-up assessment;Hyperbilirubinemia Age:26 hours  P2, Baby has been having difficulty latching. Unwrapped baby for feeding.  Reviewed hand expression with mother with good drops expressed. Assisted with latching in football hold on L side..  Baby sustained latch for 20 min.  Helped mother latch in football hold on R breast.  Baby needed stimulation to stay active on the breast. Suggest post pumping and giving volume back to baby or parent's may choose supplementing with formula. Mother states baby has a  hard time taking yellow nipple. Provided mother with white extra slow flow nipple.   Feeding Mother's Current Feeding Choice: Breast Milk and Formula  LATCH Score Latch: Repeated attempts needed to sustain latch, nipple held in mouth throughout feeding, stimulation needed to elicit sucking reflex.  Audible Swallowing: A few with stimulation  Type of Nipple: Everted at rest and after stimulation  Comfort (Breast/Nipple): Soft / non-tender  Hold (Positioning): Assistance needed to correctly position infant at breast and maintain latch.  LATCH Score: 7   Lactation Tools Discussed/Used Tools: Pump Flange Size: 24 Breast pump type: Double-Electric Breast Pump  Interventions Interventions: Breast feeding basics reviewed;Skin to skin;Assisted with latch;Breast compression;DEBP;Education  Discharge Discharge Education: Engorgement and breast care;Warning signs for feeding baby  Consult Status Consult Status: Follow-up Date: 05/01/21 Follow-up type: In-patient    [name] [facility] 04/30/2021, 11:52 AM

## 2021-04-30 NOTE — Progress Notes (Signed)
Patient provided discharge instructions and expressed understanding. Patient had no further questions and will remain in room with newborn awaiting newborn discharge.

## 2021-04-30 NOTE — Discharge Instructions (Signed)
Hypertension During Pregnancy Hypertension is also called high blood pressure. High blood pressure means that the force of your blood moving in your body is too strong. It can cause problems for you and your baby. Different types of high blood pressure can happen during pregnancy. The types are:  High blood pressure before you got pregnant. This is called chronic hypertension.  This can continue during your pregnancy. Your doctor will want to keep checking your blood pressure. You may need medicine to keep your blood pressure under control while you are pregnant. You will need follow-up visits after you have your baby.  High blood pressure that goes up during pregnancy when it was normal before. This is called gestational hypertension. It will usually get better after you have your baby, but your doctor will need to watch your blood pressure to make sure that it is getting better.  Very high blood pressure during pregnancy. This is called preeclampsia. Very high blood pressure is an emergency that needs to be checked and treated right away.  You may develop very high blood pressure after giving birth. This is called postpartum preeclampsia. This usually occurs within 48 hours after childbirth but may occur up to 6 weeks after giving birth. This is rare. How does this affect me? If you have high blood pressure during pregnancy, you have a higher chance of developing high blood pressure:  As you get older.  If you get pregnant again. In some cases, high blood pressure during pregnancy can cause:  Stroke.  Heart attack.  Damage to the kidneys, lungs, or liver.  Preeclampsia.  Jerky movements you cannot control (convulsions or seizures).  Problems with the placenta.   What can I do to lower my risk?   Keep a healthy weight.  Eat a healthy diet.  Follow what your doctor tells you about treating any medical problems that you had before becoming pregnant. It is very important to go to  all of your doctor visits. Your doctor will check your blood pressure and make sure that your pregnancy is progressing as it should. Treatment should start early if a problem is found.   Follow these instructions at home:  Take your blood pressure 1-2 times per day. Call the office if your blood pressure is 155 or higher for the top number or 105 or higher for the bottom number.    Eating and drinking   Drink enough fluid to keep your pee (urine) pale yellow.  Avoid caffeine. Lifestyle  Do not use any products that contain nicotine or tobacco, such as cigarettes, e-cigarettes, and chewing tobacco. If you need help quitting, ask your doctor.  Do not use alcohol or drugs.  Avoid stress.  Rest and get plenty of sleep.  Regular exercise can help. Ask your doctor what kinds of exercise are best for you. General instructions  Take over-the-counter and prescription medicines only as told by your doctor.  Keep all prenatal and follow-up visits as told by your doctor. This is important. Contact a doctor if:  You have symptoms that your doctor told you to watch for, such as: ? Headaches. ? Nausea. ? Vomiting. ? Belly (abdominal) pain. ? Dizziness. ? Light-headedness. Get help right away if:  You have: ? Very bad belly pain that does not get better with treatment. ? A very bad headache that does not get better. ? Vomiting that does not get better. ? Sudden, fast weight gain. ? Sudden swelling in your hands, ankles, or face. ?   Blood in your pee. ? Blurry vision. ? Double vision. ? Shortness of breath. ? Chest pain. ? Weakness on one side of your body. ? Trouble talking. Summary  High blood pressure is also called hypertension.  High blood pressure means that the force of your blood moving in your body is too strong.  High blood pressure can cause problems for you and your baby.  Keep all follow-up visits as told by your doctor. This is important. This information is  not intended to replace advice given to you by your health care provider. Make sure you discuss any questions you have with your health care provider. Document Released: 06/24/2010 Document Revised: 09/12/2018 Document Reviewed: 06/18/2018 Elsevier Patient Education  2020 Elsevier Inc.   

## 2021-05-01 ENCOUNTER — Ambulatory Visit: Payer: Self-pay

## 2021-05-01 NOTE — Lactation Note (Addendum)
This note was copied from a baby's chart. Lactation Consultation Note  Patient Name: Heather Noble Date: 05/01/2021 Reason for consult: Follow-up assessment;Mother's request;Difficult latch;Term;Infant weight loss;Hyperbilirubinemia;Breastfeeding assistance;Other (Comment) (PIH) Age:26 hours  Infant bilirubin levels increased from yesterday to today. Mom pumping consistently, last session 20 ml of EBM offered first before 40 ml of formula.   Mom had 2 feedings at the breast today, states no pain with current latches and signs of milk transfer noted during feedings.   Mom volume per feeding now 45-60 ml. Mom to increase as tolerated. Mother aware if infant not latching, she can offer more. Infant adequate urine and stool output.   All questions answered at the end of the visit.   Maternal Data    Feeding Mother's Current Feeding Choice: Breast Milk and Formula Nipple Type: Extra Slow Flow  LATCH Score                    Lactation Tools Discussed/Used Tools: Flanges;Pump Flange Size: 24 Breast pump type: Double-Electric Breast Pump Reason for Pumping: increase stimulation Pumping frequency: every 3 hrs for  Interventions Interventions: Breast feeding basics reviewed;Education;Pace feeding;Infant Driven Feeding Algorithm education;Breast compression;DEBP  Discharge    Consult Status Consult Status: Follow-up Date: 05/02/21 Follow-up type: In-patient    Lindy Garczynski  [name] 05/01/2021, 4:00 PM

## 2021-05-02 ENCOUNTER — Ambulatory Visit (INDEPENDENT_AMBULATORY_CARE_PROVIDER_SITE_OTHER): Payer: Medicaid Other | Admitting: Obstetrics and Gynecology

## 2021-05-02 ENCOUNTER — Encounter: Payer: Self-pay | Admitting: Obstetrics and Gynecology

## 2021-05-02 ENCOUNTER — Telehealth: Payer: Self-pay

## 2021-05-02 ENCOUNTER — Inpatient Hospital Stay (HOSPITAL_COMMUNITY): Admission: RE | Admit: 2021-05-02 | Payer: Medicaid Other | Source: Ambulatory Visit

## 2021-05-02 DIAGNOSIS — O1410 Severe pre-eclampsia, unspecified trimester: Secondary | ICD-10-CM | POA: Clinically undetermined

## 2021-05-02 LAB — SURGICAL PATHOLOGY

## 2021-05-02 MED ORDER — NIFEDIPINE ER OSMOTIC RELEASE 30 MG PO TB24
30.0000 mg | ORAL_TABLET | Freq: Every day | ORAL | 2 refills | Status: DC
Start: 2021-05-02 — End: 2021-05-06

## 2021-05-02 MED ORDER — OXYCODONE HCL 5 MG PO TABS: 5.0000 mg | ORAL_TABLET | ORAL | 0 refills | Status: DC | PRN

## 2021-05-02 MED ORDER — IBUPROFEN 800 MG PO TABS: 800.0000 mg | ORAL_TABLET | Freq: Three times a day (TID) | ORAL | 3 refills | Status: DC | PRN

## 2021-05-02 NOTE — Telephone Encounter (Signed)
Patient called to report (2) blisters near stitches. She delivered 04/28/21 with a 2nd degree tear. Informed pt we will consult with provider and call her back with recommendations.

## 2021-05-02 NOTE — Progress Notes (Signed)
GYNECOLOGY OFFICE VISIT NOTE  History:   Heather Noble is a 25 y.o. [phone] here today for feels like blisters on her vulva. She has been doing topical medicine for it but it has not helped substantially. She cannot sit right. She is having trouble pumping/nursing due to positioning.   Her delivery was c/b severe PreE and she got PP magnesium. She was not sent home on anything for BP.   BP elevated today but she was in substantial pain on initial check due to her bottom.    She denies any abnormal vaginal discharge, bleeding, pelvic pain or other concerns.     Past Medical History:  Diagnosis Date   Headache    Pregnancy induced hypertension     Past Surgical History:  Procedure Laterality Date   CESAREAN SECTION N/A 04/21/2017   Procedure: CESAREAN SECTION;  Surgeon: Tereso Newcomer, MD;  Location: [facility];  Service: Obstetrics;  Laterality: N/A;   WISDOM TOOTH EXTRACTION  2015    The following portions of the patient's history were reviewed and updated as appropriate: allergies, current medications, past family history, past medical history, past social history, past surgical history and problem list.   Health Maintenance:    Diagnosis  Date Value Ref Range Status  10/06/2020   Final   - Negative for intraepithelial lesion or malignancy (NILM)     Review of Systems:  Pertinent items noted in HPI and remainder of comprehensive ROS otherwise negative.  Physical Exam:  BP (!) 165/118   Pulse (!) 123   Wt 212 lb (96.2 kg)   LMP 07/21/2020   BMI 35.28 kg/m  CONSTITUTIONAL: Well-developed, well-nourished female in no acute distress.  HEENT:  Normocephalic, atraumatic. External right and left ear normal. No scleral icterus.  NECK: Normal range of motion, supple, no masses noted on observation SKIN: No rash noted. Not diaphoretic. No erythema. No pallor. MUSCULOSKELETAL: Normal range of motion. No edema noted. NEUROLOGIC: Alert and oriented to person,  place, and time. Normal muscle tone coordination. No cranial nerve deficit noted. PSYCHIATRIC: Normal mood and affect. Normal behavior. Normal judgment and thought content.  CARDIOVASCULAR: Normal heart rate noted RESPIRATORY: Effort and breath sounds normal, no problems with respiration noted ABDOMEN: No masses noted. No other overt distention noted.    PELVIC: Normal appearing external genitalia but there is slight dehiscence to the perineal laceration. It is the superficial skin area on the right side. The deeper sutures are still intact. About a 2x2cm area if still exposed.   Labs and Imaging  No results found.  Assessment and Plan:  Heather Noble was seen today for gynecologic exam.  Diagnoses and all orders for this visit:  Postpartum care and examination - Perineum with partial dehiscence. Discussed comfort measures - ice packs, aquaphor, dermaplast, and sitz baths. Discussed donut ring.  - Discussed pain control first with tylenol and ibuprofen. She has been taking tylenol without relief of pain. Discussed risks of narcotic medication - Return in one week for incision check.  -     oxyCODONE (OXY IR/ROXICODONE) 5 MG immediate release tablet; Take 1 tablet (5 mg total) by mouth every 4 (four) hours as needed for severe pain or breakthrough pain. -     ibuprofen (ADVIL) 800 MG tablet; Take 1 tablet (800 mg total) by mouth 3 (three) times daily with meals as needed for headache, moderate pain or cramping. - Her blood pressure on recheck was: 142/80.  I will send in Procardia and will do  BP check in one week.    Routine preventative health maintenance measures emphasized. Please refer to After Visit Summary for other counseling recommendations.   Return in about 1 week (around 05/09/2021) for postpartum visit - BP check and perineum check.  Milas Hock, MD, FACOG Obstetrician & Gynecologist, [facility], [facility]

## 2021-05-03 ENCOUNTER — Inpatient Hospital Stay (HOSPITAL_COMMUNITY): Payer: Medicaid Other

## 2021-05-03 ENCOUNTER — Encounter (HOSPITAL_COMMUNITY): Payer: Self-pay | Admitting: Emergency Medicine

## 2021-05-03 ENCOUNTER — Other Ambulatory Visit: Payer: Self-pay

## 2021-05-03 ENCOUNTER — Inpatient Hospital Stay (HOSPITAL_COMMUNITY)
Admission: EM | Admit: 2021-05-03 | Discharge: 2021-05-06 | DRG: 776 | Disposition: A | Discharge status: Home / Self Care | Payer: Medicaid Other | Attending: Obstetrics and Gynecology | Admitting: Obstetrics and Gynecology

## 2021-05-03 DIAGNOSIS — Z20822 Contact with and (suspected) exposure to covid-19: Secondary | ICD-10-CM | POA: Diagnosis present

## 2021-05-03 DIAGNOSIS — O1495 Unspecified pre-eclampsia, complicating the puerperium: Secondary | ICD-10-CM | POA: Diagnosis not present

## 2021-05-03 DIAGNOSIS — O9081 Anemia of the puerperium: Secondary | ICD-10-CM | POA: Diagnosis present

## 2021-05-03 DIAGNOSIS — I161 Hypertensive emergency: Secondary | ICD-10-CM

## 2021-05-03 DIAGNOSIS — O99893 Other specified diseases and conditions complicating puerperium: Secondary | ICD-10-CM | POA: Diagnosis present

## 2021-05-03 DIAGNOSIS — F445 Conversion disorder with seizures or convulsions: Secondary | ICD-10-CM

## 2021-05-03 DIAGNOSIS — O1415 Severe pre-eclampsia, complicating the puerperium: Principal | ICD-10-CM | POA: Diagnosis present

## 2021-05-03 DIAGNOSIS — O99355 Diseases of the nervous system complicating the puerperium: Secondary | ICD-10-CM | POA: Diagnosis present

## 2021-05-03 DIAGNOSIS — R569 Unspecified convulsions: Secondary | ICD-10-CM

## 2021-05-03 DIAGNOSIS — G40909 Epilepsy, unspecified, not intractable, without status epilepticus: Secondary | ICD-10-CM | POA: Diagnosis present

## 2021-05-03 DIAGNOSIS — M545 Low back pain, unspecified: Secondary | ICD-10-CM | POA: Diagnosis present

## 2021-05-03 LAB — CBC WITH DIFFERENTIAL/PLATELET
Abs Immature Granulocytes: 0.17 10*3/uL — ABNORMAL HIGH (ref 0.00–0.07)
Basophils Absolute: 0 10*3/uL (ref 0.0–0.1)
Basophils Relative: 0 %
Eosinophils Absolute: 0.1 10*3/uL (ref 0.0–0.5)
Eosinophils Relative: 1 %
HCT: 18.2 % — ABNORMAL LOW (ref 36.0–46.0)
Hemoglobin: 5.6 g/dL — CL (ref 12.0–15.0)
Immature Granulocytes: 2 %
Lymphocytes Relative: 30 %
Lymphs Abs: 2.6 10*3/uL (ref 0.7–4.0)
MCH: 29.9 pg (ref 26.0–34.0)
MCHC: 30.8 g/dL (ref 30.0–36.0)
MCV: 97.3 fL (ref 80.0–100.0)
Monocytes Absolute: 0.8 10*3/uL (ref 0.1–1.0)
Monocytes Relative: 10 %
Neutro Abs: 5 10*3/uL (ref 1.7–7.7)
Neutrophils Relative %: 57 %
Platelets: 384 10*3/uL (ref 150–400)
RBC: 1.87 MIL/uL — ABNORMAL LOW (ref 3.87–5.11)
RDW: 13.3 % (ref 11.5–15.5)
WBC: 8.7 10*3/uL (ref 4.0–10.5)
nRBC: 0.3 % — ABNORMAL HIGH (ref 0.0–0.2)

## 2021-05-03 LAB — RESP PANEL BY RT-PCR (FLU A&B, COVID) ARPGX2
Influenza A by PCR: NEGATIVE
Influenza B by PCR: NEGATIVE
SARS Coronavirus 2 by RT PCR: NEGATIVE

## 2021-05-03 LAB — PREPARE RBC (CROSSMATCH)

## 2021-05-03 LAB — COMPREHENSIVE METABOLIC PANEL
ALT: 37 U/L (ref 0–44)
AST: 63 U/L — ABNORMAL HIGH (ref 15–41)
Albumin: 2.5 g/dL — ABNORMAL LOW (ref 3.5–5.0)
Alkaline Phosphatase: 145 U/L — ABNORMAL HIGH (ref 38–126)
Anion gap: 5 (ref 5–15)
BUN: 11 mg/dL (ref 6–20)
CO2: 26 mmol/L (ref 22–32)
Calcium: 8.6 mg/dL — ABNORMAL LOW (ref 8.9–10.3)
Chloride: 108 mmol/L (ref 98–111)
Creatinine, Ser: 0.83 mg/dL (ref 0.44–1.00)
GFR, Estimated: >60 mL/min (ref >60)
Glucose, Bld: 83 mg/dL (ref 70–99)
Potassium: 4.1 mmol/L (ref 3.5–5.1)
Sodium: 139 mmol/L (ref 135–145)
Total Bilirubin: 0.5 mg/dL (ref 0.3–1.2)
Total Protein: 6 g/dL — ABNORMAL LOW (ref 6.5–8.1)

## 2021-05-03 LAB — ETHANOL: Alcohol, Ethyl (B): <10 mg/dL (ref <10)

## 2021-05-03 MED ORDER — MAGNESIUM SULFATE 2 GM/50ML IV SOLN: 2.0000 g | Freq: Once | INTRAVENOUS | Status: DC

## 2021-05-03 MED ORDER — ACETAMINOPHEN 325 MG PO TABS
650.0000 mg | ORAL_TABLET | ORAL | Status: DC | PRN
  Administered 2021-05-04 – 2021-05-06 (×3): 650 mg via ORAL
  Filled 2021-05-03 (×3): qty 2

## 2021-05-03 MED ORDER — LORAZEPAM 2 MG/ML IJ SOLN
4.0000 mg | Freq: Once | INTRAMUSCULAR | Status: AC
  Administered 2021-05-03: 4 mg via INTRAVENOUS

## 2021-05-03 MED ORDER — NIFEDIPINE ER OSMOTIC RELEASE 30 MG PO TB24
30.0000 mg | ORAL_TABLET | Freq: Every day | ORAL | Status: DC
  Administered 2021-05-04 – 2021-05-05 (×2): 30 mg via ORAL
  Filled 2021-05-03 (×5): qty 1

## 2021-05-03 MED ORDER — LABETALOL HCL 5 MG/ML IV SOLN: 80.0000 mg | INTRAVENOUS | Status: DC | PRN

## 2021-05-03 MED ORDER — LABETALOL HCL 5 MG/ML IV SOLN: 10.0000 mg | Freq: Once | INTRAVENOUS | Status: DC

## 2021-05-03 MED ORDER — MAGNESIUM SULFATE 40 GM/1000ML IV SOLN
2.0000 g/h | INTRAVENOUS | Status: AC
  Administered 2021-05-03 – 2021-05-04 (×2): 2 g/h via INTRAVENOUS
  Filled 2021-05-03 (×2): qty 1000

## 2021-05-03 MED ORDER — LEVETIRACETAM IN NACL 1500 MG/100ML IV SOLN
3000.0000 mg | Freq: Once | INTRAVENOUS | Status: AC
  Administered 2021-05-03: 3000 mg via INTRAVENOUS
  Filled 2021-05-03: qty 200

## 2021-05-03 MED ORDER — LEVETIRACETAM IN NACL 1000 MG/100ML IV SOLN
1000.0000 mg | Freq: Two times a day (BID) | INTRAVENOUS | Status: DC
  Administered 2021-05-03: 1000 mg via INTRAVENOUS

## 2021-05-03 MED ORDER — MAGNESIUM SULFATE 2 GM/50ML IV SOLN
2.0000 g | Freq: Once | INTRAVENOUS | Status: AC
  Administered 2021-05-03: 2 g via INTRAVENOUS

## 2021-05-03 MED ORDER — DIPHENHYDRAMINE HCL 25 MG PO CAPS: 25.0000 mg | ORAL_CAPSULE | Freq: Once | ORAL | Status: DC

## 2021-05-03 MED ORDER — ACETAMINOPHEN 325 MG PO TABS: 650.0000 mg | ORAL_TABLET | Freq: Once | ORAL | Status: DC

## 2021-05-03 MED ORDER — LABETALOL HCL 5 MG/ML IV SOLN
10.0000 mg | INTRAVENOUS | Status: DC | PRN
  Administered 2021-05-03: 10 mg via INTRAVENOUS

## 2021-05-03 MED ORDER — LEVETIRACETAM IN NACL 1000 MG/100ML IV SOLN: 1000.0000 mg | Freq: Two times a day (BID) | INTRAVENOUS | Status: DC

## 2021-05-03 MED ORDER — ACETAMINOPHEN 10 MG/ML IV SOLN
1000.0000 mg | Freq: Once | INTRAVENOUS | Status: AC
  Administered 2021-05-03: 1000 mg via INTRAVENOUS
  Filled 2021-05-03: qty 100

## 2021-05-03 MED ORDER — SODIUM CHLORIDE 0.9% IV SOLUTION: Freq: Once | INTRAVENOUS | Status: DC

## 2021-05-03 MED ORDER — SODIUM CHLORIDE 0.9 % IV BOLUS
1000.0000 mL | Freq: Once | INTRAVENOUS | Status: AC
  Administered 2021-05-03: 1000 mL via INTRAVENOUS

## 2021-05-03 MED ORDER — LABETALOL HCL 5 MG/ML IV SOLN: 20.0000 mg | INTRAVENOUS | Status: DC | PRN

## 2021-05-03 MED ORDER — HYDRALAZINE HCL 20 MG/ML IJ SOLN: 10.0000 mg | INTRAMUSCULAR | Status: DC | PRN

## 2021-05-03 MED ORDER — LABETALOL HCL 5 MG/ML IV SOLN: 40.0000 mg | INTRAVENOUS | Status: DC | PRN

## 2021-05-03 MED ORDER — DIPHENHYDRAMINE HCL 50 MG/ML IJ SOLN
25.0000 mg | Freq: Once | INTRAMUSCULAR | Status: AC
Start: 2021-05-03 — End: 2021-05-03
  Administered 2021-05-03: 25 mg via INTRAVENOUS

## 2021-05-03 MED ORDER — LEVETIRACETAM 500 MG/5ML IV SOLN: 2000.0000 mg | Freq: Two times a day (BID) | INTRAVENOUS | Status: DC

## 2021-05-03 MED ORDER — CALCIUM CARBONATE ANTACID 500 MG PO CHEW: 2.0000 | CHEWABLE_TABLET | ORAL | Status: DC | PRN

## 2021-05-03 MED ORDER — PRENATAL MULTIVITAMIN CH
1.0000 | ORAL_TABLET | Freq: Every day | ORAL | Status: DC
  Administered 2021-05-05: 1 via ORAL
  Filled 2021-05-03: qty 1

## 2021-05-03 MED ORDER — DOCUSATE SODIUM 100 MG PO CAPS
100.0000 mg | ORAL_CAPSULE | Freq: Every day | ORAL | Status: DC
  Administered 2021-05-05 – 2021-05-06 (×2): 100 mg via ORAL
  Filled 2021-05-03 (×2): qty 1

## 2021-05-03 NOTE — ED Provider Notes (Signed)
[facility] EMERGENCY DEPARTMENT Provider Note   CSN: [id number] Arrival date & time: 05/03/21  1533     History Chief Complaint  Patient presents with   Seizures    Heather Noble is a 25 y.o. female.  HPI Patient presents via EMS after witnessed seizure-like activity. Patient is 5 days status post vaginal delivery, reportedly unremarkable, corroborated on chart review. She was recovering well, but today had witnessed seizure.  EMS reports that on arrival the patient was experiencing clonic behavior, was hypertensive, with a systolic greater than 924.  She received a total of 10 mg IM Versed in route, had cessation of seizure-like activity just prior to ED arrival.  Level 5 caveat secondary to acuity of condition. Additional details on chart review including notation from delivery, with vaginal tear otherwise unremarkable.    Past Medical History:  Diagnosis Date   Headache    Pregnancy induced hypertension     Patient Active Problem List   Diagnosis Date Noted   Severe pre-eclampsia 05/02/2021   Gestational hypertension 04/26/2021   GBS (group B Streptococcus carrier), +RV culture, currently pregnant 04/16/2021   Encounter for supervision of normal pregnancy, antepartum 09/29/2020   History of gestational hypertension 04/21/2017   Sickle cell trait ([place]) 11/03/2016   Rubella non-immune status, antepartum 11/03/2016   Anemia in pregnancy 11/03/2016   S/P cesarean section 10/16/2016   Migraine with aura and without status migrainosus, not intractable 10/16/2014    Past Surgical History:  Procedure Laterality Date   CESAREAN SECTION N/A 04/21/2017   Procedure: CESAREAN SECTION;  Surgeon: Osborne Oman, MD;  Location: [place];  Service: Obstetrics;  Laterality: N/A;   WISDOM TOOTH EXTRACTION  2015     OB History     Gravida  2   Para  2   Term  2   Preterm  0   AB  0   Living  2      SAB  0   IAB  0   Ectopic  0    Multiple  0   Live Births  2           Family History  Problem Relation Age of Onset   Hypertension Maternal Grandmother    Osteoarthritis Maternal Grandmother    Gout Maternal Grandmother    Thyroid disease Maternal Grandmother    Diabetes Maternal Grandmother    Cancer Paternal Grandfather        unknown    Social History   Tobacco Use   Smoking status: Never   Smokeless tobacco: Never  Vaping Use   Vaping Use: Never used  Substance Use Topics   Alcohol use: No    Comment: occ   Drug use: No    Home Medications Prior to Admission medications   Medication Sig Start Date End Date Taking? Authorizing Provider  Blood Pressure Monitoring (BLOOD PRESSURE KIT) DEVI 1 kit by Does not apply route once a week. 09/29/20   Chancy Milroy, MD  famotidine (PEPCID) 20 MG tablet Take 1 tablet (20 mg total) by mouth at bedtime. 01/14/21   Julianne Handler, CNM  ibuprofen (ADVIL) 800 MG tablet Take 1 tablet (800 mg total) by mouth 3 (three) times daily with meals as needed for headache, moderate pain or cramping. 05/02/21   Radene Gunning, MD  NIFEdipine (PROCARDIA-XL/NIFEDICAL-XL) 30 MG 24 hr tablet Take 1 tablet (30 mg total) by mouth daily. Can increase to twice a day as needed for symptomatic contractions  05/02/21   Radene Gunning, MD  ondansetron (ZOFRAN ODT) 4 MG disintegrating tablet Take 1 tablet (4 mg total) by mouth every 8 (eight) hours as needed for nausea or vomiting. Patient not taking: Reported on 03/01/2021 01/14/21   Julianne Handler, CNM  oxyCODONE (OXY IR/ROXICODONE) 5 MG immediate release tablet Take 1 tablet (5 mg total) by mouth every 4 (four) hours as needed for severe pain or breakthrough pain. 05/02/21   Radene Gunning, MD  Prenatal MV & Min w/FA-DHA ([facility] PRENATAL GUMMY) 0.4-25 MG CHEW Chew 3 tablets by mouth daily. 09/08/20   Chancy Milroy, MD    Allergies    Patient has no known allergies.  Review of Systems   Review of Systems  Unable to perform ROS:  Acuity of condition   Physical Exam Updated Vital Signs BP (!) 163/103   Pulse 76   Temp 98.9 F (37.2 C) (Axillary)   Resp (!) 31   LMP 07/21/2020   SpO2 100%   Physical Exam Vitals and nursing note reviewed.  Constitutional:      Appearance: She is well-developed. She is ill-appearing.  HENT:     Head: Normocephalic and atraumatic.  Eyes:     Conjunctiva/sclera: Conjunctivae normal.  Cardiovascular:     Rate and Rhythm: Normal rate and regular rhythm.  Pulmonary:     Effort: Pulmonary effort is normal. No respiratory distress.     Breath sounds: Normal breath sounds. No stridor.  Abdominal:     General: There is no distension.  Skin:    General: Skin is warm and dry.  Neurological:     Comments: Actively seizing with right arm rhythmic motion, right masseter muscle twitching.    ED Results / Procedures / Treatments   Labs (all labs ordered are listed, but only abnormal results are displayed) Labs Reviewed  COMPREHENSIVE METABOLIC PANEL  ETHANOL  CBC WITH DIFFERENTIAL/PLATELET    EKG None  Radiology CT HEAD WO CONTRAST (5MM)  Result Date: 05/04/2021 CLINICAL DATA:  Mental status change EXAM: CT HEAD WITHOUT CONTRAST TECHNIQUE: Contiguous axial images were obtained from the base of the skull through the vertex without intravenous contrast. COMPARISON:  CT head 11/04/2020 FINDINGS: Brain: No evidence of acute infarction, hemorrhage, hydrocephalus, extra-axial collection or mass lesion/mass effect. Vascular: Negative for hyperdense vessel Skull: Negative Sinuses/Orbits: Mild mucosal edema paranasal sinuses. Negative orbit Other: None IMPRESSION: Negative CT head Mild sinus mucosal edema. Electronically Signed   By: Franchot Gallo M.D.   On: 05/04/2021 14:52    Procedures Procedures   Medications Ordered in ED Medications  labetalol (NORMODYNE) injection 10 mg (has no administration in time range)  magnesium sulfate IVPB 2 g 50 mL (has no administration in time  range)  labetalol (NORMODYNE) injection 10 mg (10 mg Intravenous Given 05/03/21 1543)  levETIRAcetam (KEPPRA) IVPB 1000 mg/100 mL premix (1,000 mg Intravenous New Bag/Given 05/03/21 1604)  levETIRAcetam (KEPPRA) 2,000 mg in sodium chloride 0.9 % 250 mL IVPB (has no administration in time range)  magnesium sulfate IVPB 2 g 50 mL (0 g Intravenous Stopped 05/03/21 1558)  sodium chloride 0.9 % bolus 1,000 mL (1,000 mLs Intravenous New Bag/Given 05/03/21 1549)    ED Course  I have reviewed the triage vital signs and the nursing notes.  Pertinent labs & imaging results that were available during my care of the patient were reviewed by me and considered in my medical decision making (see chart for details).   4:16 PM Neurology bedside together we discussed the  patient's presentation.  With ongoing chronic activity, right-sided, and face, patient will receive additional Keppra, total 3 g.  She has received 10 mg of Versed thus far, will receive 4 mg Ativan IV.  She has received magnesium 2 g, and labetalol.  Blood pressure now improved, 160/110. MDM Rules/Calculators/A&P Adult female presents several days after delivery now with concern for seizure-like activity hypertensive crisis. With initial consideration of eclampsia given the hypertension, change in neuro status, and the patient's history of hypertension patient received multiple doses of Versed by EMS, and Ativan, Keppra in the ED.  That conversation with our neurology colleagues, concurrent evaluation, with labetalol provided for blood pressure control resulted in substantial decrease in seizure-like activity, and improved blood pressure control. On signout the patient had substantial improvement, though with additional considerations including seizure-like versus seizure episodes, hypertensive emergency, postpartum changes, the patient will require admission for further monitoring, management. Final Clinical Impression(s) / ED Diagnoses Final  diagnoses:  Seizure-like activity [facility])  Hypertensive emergency  MDM Number of Diagnoses or Management Options Hypertensive emergency: new, needed workup Seizure-like activity ([place]): new, needed workup   Amount and/or Complexity of Data Reviewed Clinical lab tests: ordered and reviewed Tests in the radiology section of CPT: ordered Tests in the medicine section of CPT: reviewed and ordered Discussion of test results with the performing providers: yes Decide to obtain previous medical records or to obtain history from someone other than the patient: yes Obtain history from someone other than the patient: yes Review and summarize past medical records: yes Discuss the patient with other providers: yes Independent visualization of images, tracings, or specimens: yes  Risk of Complications, Morbidity, and/or Mortality Presenting problems: high Diagnostic procedures: high Management options: high  Critical Care Total time providing critical care: 30-74 minutes (45)  Patient Progress Patient progress: stable     Carmin Muskrat, MD 05/04/21 1643

## 2021-05-03 NOTE — Procedures (Addendum)
Patient Name: Heather Noble  MRN: [id number]  Epilepsy Attending: Charlsie Quest  Referring Physician/Provider: Kara Mead, NP Date: 05/03/2021 Duration: 22.34 mins  Patient history: 25yo F with seizure like episodes. EEG to evaluate for seizure.   Level of alertness: Awake  AEDs during EEG study: LEV, ativan  Technical aspects: This EEG study was done with scalp electrodes positioned according to the 10-20 International system of electrode placement. Electrical activity was acquired at a sampling rate of 500Hz  and reviewed with a high frequency filter of 70Hz  and a low frequency filter of 1Hz . EEG data were recorded continuously and digitally stored.   Description: No posterior dominant rhythm consists of 9-10 Hz activity of moderate voltage (25-35 uV) seen predominantly in posterior head regions, symmetric and reactive to eye opening and eye closing. There is an excessive amount of 15 to 18 Hz beta activity distributed symmetrically and diffusely.   Patient was noted to have multiple episodes of head shaking throughout the study.Concomitant EEG before, during and after the event did not show any EEG changes suggest seizure.  Hyperventilation and photic stimulation were not performed.     ABNORMALITY - Excessive beta, generalized   IMPRESSION: This study is within normal limits. The excessive beta activity seen in the background is most likely due to the effect of benzodiazepine and is a benign EEG pattern. No seizures or epileptiform discharges were seen throughout the recording.  Patient was noted to have multiple episodes of head shaking throughout the study without concomitant EEG change. These episodes were NOT epileptic.   Dr was notified.  Cordarious Zeek 

## 2021-05-03 NOTE — Progress Notes (Signed)
LTM EEG hooked up and running - no initial skin breakdown - push button tested - neuro notified.  

## 2021-05-03 NOTE — ED Provider Notes (Signed)
Blood pressure (!) 164/105, pulse 89, temperature 98.9 F (37.2 C), temperature source Axillary, resp. rate 20, last menstrual period 07/21/2020, SpO2 100 %, unknown if currently breastfeeding.  Assuming care from Dr. Jeraldine Loots.  In short, Heather Noble is a 25 y.o. female with a chief complaint of Seizures .  Refer to the original H&P for additional details.  The current plan of care is to follow with Neurology recommendations with concern for eclampsia.   05:00 PM  CBC back with Hb of 5.6. Mom, at bedside, reports heavy vaginal bleeding since the NSVD. No large volume blood described on the discharge summary or delivery note. Patient with EEG ongoing now. Will speak with OB regarding the bleeding but will need stabilization prior to the exam.   05:18 PM  Spoke with Dr. Otelia Limes with neurology after EEG interpretation.  There is no epileptic activity.  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  They will continue to consult and plan for Larrisa Cravey-term monitoring.  No need for additional Keppra.  Episodes can be managed with benzodiazepines and morphine as needed.  OB/GYN to consult with the anemia.  Plan for hospitalist admit.   Discussed patient's case with OB/Gyn to request admission. Patient and family (if present) updated with plan. Care transferred to [facility] service.  I reviewed all nursing notes, vitals, pertinent old records, EKGs, labs, imaging (as available).    Maia Plan, MD 05/03/21 [phone]

## 2021-05-03 NOTE — Consult Note (Addendum)
NEURO HOSPITALIST CONSULT NOTE   Requestig physician: Dr. Vanita Panda  Reason for Consult: Status epilepticus  History obtained from:  Family and Chart     HPI:                                                                                                                                          Heather Noble is an 25 y.o. female postpartum day 5, with a history of HTN during pregnancy and headaches, who presents to ED from home via EMS for seizure-like activity for approximately 1.5 hours total prior to arrival. She had a vaginal delivery on 11/24 and had only been complaining of vaginal bleeding since. EMS administered $RemoveBeforeDE'10mg'[id number]$  total of IM midazolam in separate doses without resolution of seizure-like activity. Per EMS the patient was down to 80% RA needing BVM ~54m. Vitals per EMS: BP 182/114, HR - 70, 100% NRB.   In the ED she received magnesium sulfate IV without effect. Keppra 1000 mg IV was hung but due to catheter malfunction, most or all of the Keppra was not administered. She was seizing at the time of Neurology arrival to the room 4 minutes after initial page. Motor activity during the ongoing spell consists of oscillatory conjugate movements of her eyes with low amplitude approximately 3 Hz head to and fro movements, as well as movements of all 4 extremities that are synchronous with the head movements. The patient's eyes remain open and she is nonverbal, not following commands. The movements do not abate with sternal rub or noxious plantar stimulation, but she does withdraw each limb to sustained noxious.   EEG ordered STAT, revealing no electrographic seizures on preliminary review. Dr. Hortense Ramal was notified of the EEG at that time and will be providing a STAT read.   Mother states that the patient has been under significant stress during and after her pregnancy.   Past Medical History:  Diagnosis Date   Headache    Pregnancy induced hypertension     Past Surgical  History:  Procedure Laterality Date   CESAREAN SECTION N/A 04/21/2017   Procedure: CESAREAN SECTION;  Surgeon: Osborne Oman, MD;  Location: [facility];  Service: Obstetrics;  Laterality: N/A;   WISDOM TOOTH EXTRACTION  2015    Family History  Problem Relation Age of Onset   Hypertension Maternal Grandmother    Osteoarthritis Maternal Grandmother    Gout Maternal Grandmother    Thyroid disease Maternal Grandmother    Diabetes Maternal Grandmother    Cancer Paternal Grandfather        unknown              Social History:  reports that she has never smoked. She has never used smokeless tobacco. She reports that she does not drink alcohol  and does not use drugs.  No Known Allergies  HOME MEDICATIONS:                                                                                                                      No current facility-administered medications on file prior to encounter.   Current Outpatient Medications on File Prior to Encounter  Medication Sig Dispense Refill   Blood Pressure Monitoring (BLOOD PRESSURE KIT) DEVI 1 kit by Does not apply route once a week. 1 each 0   famotidine (PEPCID) 20 MG tablet Take 1 tablet (20 mg total) by mouth at bedtime. 30 tablet 1   ibuprofen (ADVIL) 800 MG tablet Take 1 tablet (800 mg total) by mouth 3 (three) times daily with meals as needed for headache, moderate pain or cramping. 30 tablet 3   NIFEdipine (PROCARDIA-XL/NIFEDICAL-XL) 30 MG 24 hr tablet Take 1 tablet (30 mg total) by mouth daily. Can increase to twice a day as needed for symptomatic contractions 30 tablet 2   ondansetron (ZOFRAN ODT) 4 MG disintegrating tablet Take 1 tablet (4 mg total) by mouth every 8 (eight) hours as needed for nausea or vomiting. (Patient not taking: Reported on 03/01/2021) 20 tablet 1   oxyCODONE (OXY IR/ROXICODONE) 5 MG immediate release tablet Take 1 tablet (5 mg total) by mouth every 4 (four) hours as needed for severe pain or breakthrough  pain. 16 tablet 0   Prenatal MV & Min w/FA-DHA ([employer] PRENATAL GUMMY) 0.4-25 MG CHEW Chew 3 tablets by mouth daily. 90 tablet 12     ROS:                                                                                                                                       Unable to obtain due to patient being nonverbal.    Blood pressure (!) 163/103, pulse 76, temperature 98.9 F (37.2 C), temperature source Axillary, resp. rate (!) 31, last menstrual period 07/21/2020, SpO2 100 %, unknown if currently breastfeeding.   General Examination:  Physical Exam  HEENT-  [place]/AT   Lungs- Respirations unlabored Extremities- No edema  Neurological Examination Mental Status: Eyes open with staring quality. Not gazing towards or away from visual stimuli. Not responding to questions or commands. Constant motor activity of eyes, head/neck and limbs that is rhythmic but waxes/wanes in intensity and is distractable for short periods while testing her responses to noxious stimuli.  Cranial Nerves: II: PERRL. No blink to threat.  III,IV, VI: No ptosis. Not gazing towards or away from visual stimuli. Eyes with waxing/waning oscillatory movements near the midline. Eyes are conjugate.  V,VII: Face symmetric. Does not smile to command or grimace to pain.  VIII: Does not respond to voice IX,X: Unable to assess XI: Head is midline XII: Unable to assess Motor: Oscillatory movements of all 4 extremities is rhythmic but waxes/wanes in intensity and is distractable for short periods while testing her responses to noxious stimuli.   Sensory: Will withdraw BLE briskly after sustained noxious plantar stimulation.  Deep Tendon Reflexes: 2+ and symmetric throughout Plantars: Right: downgoing   Left: downgoing Cerebellar/Gait: Unable to assess    Lab Results: Basic Metabolic Panel: Recent Labs  Lab 04/26/21 1801  04/27/21 0642 04/28/21 0005 04/28/21 0857  NA 134* 134* 135 134*  K 3.3* 3.2* 3.9 3.7  CL 102 103 103 101  CO2 20* $Remov'23 26 24  'hmqCBM$ GLUCOSE 122* 97 101* 113*  BUN 7 7 <5* <5*  CREATININE 0.71 0.66 0.67 0.74  CALCIUM 9.0 8.5* 7.7* 7.5*  MG  --   --  5.4* 5.4*    CBC: Recent Labs  Lab 04/26/21 1801 04/27/21 0642 04/28/21 0005 04/28/21 0857  WBC 8.0 7.2 10.8* 13.7*  HGB 10.3* 9.4* 9.1* 10.2*  HCT 32.2* 28.9* 28.4* 31.1*  MCV 93.1 92.3 92.5 91.2  PLT 316 276 287 302    Cardiac Enzymes: No results for input(s): CKTOTAL, CKMB, CKMBINDEX, TROPONINI in the last 168 hours.  Lipid Panel: No results for input(s): CHOL, TRIG, HDL, CHOLHDL, VLDL, LDLCALC in the last 168 hours.  Imaging: No results found.   Assessment: 25 year old 5 days postpartum female presenting with continuous seizure-like activity 1. On exam, eyes are open with staring quality. Not gazing towards or away from visual stimuli. Not responding to questions or commands. Constant motor activity of eyes, head/neck and limbs that is rhythmic but waxes/wanes in intensity and is distractable for short periods while testing her responses to noxious stimuli. Movements did continue despite multiple administrations of Versed and 4 mg IV Ativan, and also did not cease with Keppra IV load. Also no improvement with IV magnesium.  2. EEG: This study is within normal limits. The excessive beta activity seen in the background is most likely due to the effect of benzodiazepine and is a benign EEG pattern. No seizures or epileptiform discharges were seen throughout the recording. Patient was noted to have multiple episodes of head shaking throughout the study without concomitant EEG change. These episodes were NOT epileptic.   3. Patient has been under significant stress lately per mother.  4. Overall presentation most consistent with pseudoseizure.  5. HTN.   Recommendations: 1. Discontinue Keppra 2. Reassure family during pseudoseizure  spells 3. Continue LTM EEG 4. Psychology consult in AM to discuss possible psychosocial stressors and coping mechanisms.  5. Ativan 2 mg IV PRN anxiety, up to 4 additional mg overnight.  6. HTN. Although EEG is negative, may have overlapping postpartum eclampsia. Would contact OB for assistance.  7. If not  fully back to baseline in AM despite BP and anxiety management, may need to be sedated for MRI brain.   Electronically signed: Dr. Kerney Elbe 05/03/2021, 4:18 PM

## 2021-05-03 NOTE — Progress Notes (Signed)
EEG completed, results pending. 

## 2021-05-03 NOTE — ED Notes (Signed)
Attempted to call report x 1  

## 2021-05-03 NOTE — ED Triage Notes (Signed)
Pt presents to ED BIB [facility] from home. Per EMS pt experiencing seizure-like activity ~1.5h total. Pt had vaginal delivery on 11/24. Pt only c/o vag bleeding since. EMS given 10mg  midazolam. Per EMS pt down to 80% RA needing BVM ~80m.   182/114 HR - 70 100% NRB 20 LAC

## 2021-05-03 NOTE — H&P (Signed)
Nonie Lochner is a 25 y.o. female P2 s/p TOLAC on 04/28/21 with pregnancy complicated by severe preeclampsia presenting for eclampsia vs pseudoseizures. Patient received magnesium sulfate for 24 hours following her delivery and was recently stated on procardia for persistently elevated BP. Patient was brought in by EMS secondary to seizure-like activity at home. Patient without a history of seizure disorder. History obtain from chart review as patient is still very sedated from medication received in the ED  OB History     Gravida  2   Para  2   Term  2   Preterm  0   AB  0   Living  2      SAB  0   IAB  0   Ectopic  0   Multiple  0   Live Births  2          Past Medical History:  Diagnosis Date   Headache    Pregnancy induced hypertension    Past Surgical History:  Procedure Laterality Date   CESAREAN SECTION N/A 04/21/2017   Procedure: CESAREAN SECTION;  Surgeon: Tereso Newcomer, MD;  Location: [facility];  Service: Obstetrics;  Laterality: N/A;   WISDOM TOOTH EXTRACTION  2015   Family History: family history includes Cancer in her paternal grandfather; Diabetes in her maternal grandmother; Gout in her maternal grandmother; Hypertension in her maternal grandmother; Osteoarthritis in her maternal grandmother; Thyroid disease in her maternal grandmother. Social History:  reports that she has never smoked. She has never used smokeless tobacco. She reports that she does not drink alcohol and does not use drugs.    Review of Systems See pertinent in HPI. All other systems reviewed and non contributory History   Blood pressure (!) 153/96, pulse 77, temperature 98.4 F (36.9 C), temperature source Axillary, resp. rate (!) 24, last menstrual period 07/21/2020, SpO2 99 %, unknown if currently breastfeeding. Exam Physical Exam  GENERAL: Well-developed, well-nourished female in no acute distress, heavily sedated.  LUNGS: Clear to auscultation bilaterally.   HEART: Regular rate and rhythm. ABDOMEN: Soft, nontender, nondistended. No organomegaly. PELVIC:  Normal external female genitalia. Thin lochia. Chaperone present during the pelvic exam EXTREMITIES: No cyanosis, clubbing, or edema, 2+ distal pulses.  Prenatal labs: ABO, Rh: --/--/A POS (11/29 1703) Antibody: NEG (11/29 1703) Rubella: <0.90 (05/04 1059) RPR: NON REACTIVE (11/22 1801)  HBsAg: Negative (05/04 1059)  HIV: Non Reactive (09/13 1133)  GBS: Positive/-- (11/08 1625)   Assessment/Plan: 25 yo PPD#5 with severe pre-eclampsia and anemia - Will admit for repeat magnesium sulfate given elevated BP, abnormal creatine and liver function - EEG not convincing for seizure activity. Appreciate Neurology consult  - BP management with procardia - Patient to be transfused 3 units of pRBC - Routine postpartum care and close monitoring  Madlynn Lundeen 05/03/2021, 8:07 PM

## 2021-05-04 ENCOUNTER — Encounter: Payer: Medicaid Other | Admitting: Obstetrics

## 2021-05-04 ENCOUNTER — Inpatient Hospital Stay (HOSPITAL_COMMUNITY): Payer: Medicaid Other

## 2021-05-04 LAB — CBC WITH DIFFERENTIAL/PLATELET
Abs Immature Granulocytes: 0.29 10*3/uL — ABNORMAL HIGH (ref 0.00–0.07)
Basophils Absolute: 0 10*3/uL (ref 0.0–0.1)
Basophils Relative: 0 %
Eosinophils Absolute: 0 10*3/uL (ref 0.0–0.5)
Eosinophils Relative: 0 %
HCT: 31.6 % — ABNORMAL LOW (ref 36.0–46.0)
Hemoglobin: 10.7 g/dL — ABNORMAL LOW (ref 12.0–15.0)
Immature Granulocytes: 3 %
Lymphocytes Relative: 15 %
Lymphs Abs: 1.7 10*3/uL (ref 0.7–4.0)
MCH: 29.6 pg (ref 26.0–34.0)
MCHC: 33.9 g/dL (ref 30.0–36.0)
MCV: 87.5 fL (ref 80.0–100.0)
Monocytes Absolute: 1.1 10*3/uL — ABNORMAL HIGH (ref 0.1–1.0)
Monocytes Relative: 10 %
Neutro Abs: 8.1 10*3/uL — ABNORMAL HIGH (ref 1.7–7.7)
Neutrophils Relative %: 72 %
Platelets: 426 10*3/uL — ABNORMAL HIGH (ref 150–400)
RBC: 3.61 MIL/uL — ABNORMAL LOW (ref 3.87–5.11)
RDW: 17.5 % — ABNORMAL HIGH (ref 11.5–15.5)
WBC: 11.3 10*3/uL — ABNORMAL HIGH (ref 4.0–10.5)
nRBC: 0.4 % — ABNORMAL HIGH (ref 0.0–0.2)

## 2021-05-04 LAB — COMPREHENSIVE METABOLIC PANEL
ALT: 69 U/L — ABNORMAL HIGH (ref 0–44)
AST: 137 U/L — ABNORMAL HIGH (ref 15–41)
Albumin: 2.7 g/dL — ABNORMAL LOW (ref 3.5–5.0)
Alkaline Phosphatase: 131 U/L — ABNORMAL HIGH (ref 38–126)
Anion gap: 9 (ref 5–15)
BUN: 5 mg/dL — ABNORMAL LOW (ref 6–20)
CO2: 25 mmol/L (ref 22–32)
Calcium: 8.1 mg/dL — ABNORMAL LOW (ref 8.9–10.3)
Chloride: 106 mmol/L (ref 98–111)
Creatinine, Ser: 0.7 mg/dL (ref 0.44–1.00)
GFR, Estimated: >60 mL/min (ref >60)
Glucose, Bld: 90 mg/dL (ref 70–99)
Potassium: 4.1 mmol/L (ref 3.5–5.1)
Sodium: 140 mmol/L (ref 135–145)
Total Bilirubin: 1.1 mg/dL (ref 0.3–1.2)
Total Protein: 6.7 g/dL (ref 6.5–8.1)

## 2021-05-04 LAB — URINALYSIS, ROUTINE W REFLEX MICROSCOPIC
Bilirubin Urine: NEGATIVE
Glucose, UA: NEGATIVE mg/dL
Hgb urine dipstick: NEGATIVE
Ketones, ur: NEGATIVE mg/dL
Leukocytes,Ua: NEGATIVE
Nitrite: NEGATIVE
Protein, ur: NEGATIVE mg/dL
Specific Gravity, Urine: 1.025 (ref 1.005–1.030)
pH: 7.5 (ref 5.0–8.0)

## 2021-05-04 LAB — MAGNESIUM: Magnesium: 5.5 mg/dL — ABNORMAL HIGH (ref 1.7–2.4)

## 2021-05-04 MED ORDER — HYDROMORPHONE HCL 1 MG/ML IJ SOLN
0.5000 mg | Freq: Once | INTRAMUSCULAR | Status: AC | PRN
  Administered 2021-05-04: 0.5 mg via INTRAVENOUS
  Filled 2021-05-04: qty 0.5

## 2021-05-04 MED ORDER — HYDROMORPHONE HCL 1 MG/ML IJ SOLN
0.5000 mg | INTRAMUSCULAR | Status: DC | PRN
  Administered 2021-05-04 – 2021-05-05 (×4): 0.5 mg via INTRAVENOUS
  Filled 2021-05-04 (×4): qty 0.5

## 2021-05-04 MED ORDER — OXYCODONE-ACETAMINOPHEN 5-325 MG PO TABS
1.0000 | ORAL_TABLET | Freq: Four times a day (QID) | ORAL | Status: DC | PRN
Start: 2021-05-04 — End: 2021-05-06

## 2021-05-04 MED ORDER — BENZOCAINE-MENTHOL 20-0.5 % EX AERO
1.0000 "application " | INHALATION_SPRAY | Freq: Four times a day (QID) | CUTANEOUS | Status: DC | PRN
  Administered 2021-05-04: 1 via TOPICAL
  Filled 2021-05-04 (×2): qty 56

## 2021-05-04 MED ORDER — HYDROMORPHONE HCL 1 MG/ML IJ SOLN: 2.0000 mg | Freq: Once | INTRAMUSCULAR | Status: DC | PRN

## 2021-05-04 MED ORDER — ORAL CARE MOUTH RINSE
15.0000 mL | Freq: Two times a day (BID) | OROMUCOSAL | Status: DC
  Administered 2021-05-04 – 2021-05-05 (×3): 15 mL via OROMUCOSAL

## 2021-05-04 MED ORDER — HYDROMORPHONE HCL 1 MG/ML IJ SOLN
0.5000 mg | Freq: Once | INTRAMUSCULAR | Status: AC | PRN
  Administered 2021-05-04: 0.5 mg via INTRAVENOUS
  Filled 2021-05-04 (×2): qty 0.5

## 2021-05-04 NOTE — Progress Notes (Signed)
RN entered room at approximately 0330, to observe patient speaking with partner. This was the first time on shift that RN had seen pt verbal. Pt did not know where she was or why she was here but did know who she was and who her partner is. Pt speaking in short sentences, does report pain. Pt tearful. [facility], RN

## 2021-05-04 NOTE — Progress Notes (Addendum)
Neurology Progress Note  Patient ID: Heather Noble is a 25 y.o. female with a PMHx of headache and pregnancy induced HTN  Initially consulted for: Status epilepticus    Major interval events: EEG done and within normal limits. Patient had multiple episodes of head shaking throughout the study without any concomitant EEG change.   Subjective: Patient does not verbally respond but able to follow simple commands. Patient's family insistent on neuroimaging.   Exam: Vitals:   05/04/21 0800 05/04/21 0900  BP:    Pulse:    Resp:    Temp:    SpO2: 99% 100%   Gen: In bed, comfortable  Resp: non-labored breathing, no grossly audible wheezing Cardiac: Perfusing extremities well  Abd: soft, nt  Neuro: Ment: eyes remain closed but patient opens eyes to persistent verbal cuing, and is able to follow simple commands. Constant motor activity of eyes, head/neck and limbs that is rhythmic but waxes/wanes in intensity and is distractable for short periods while testing her responses to noxious stimuli.  Cranial Nerves: II: PERRL. No blink to threat.  III,IV, VI: No ptosis. Not gazing towards or away from visual stimuli. Eyes with waxing/waning oscillatory movements near the midline. Eyes are conjugate.  V,VII: Face symmetric. Does not smile to command or grimace to pain. Pursing lips, blinking and rolling eyes repeatedly during exam with overall quality of the movements appearing most consistent with embellishment VIII: Does not respond to voice consistently. But does follow commands intermittently.  IX,X: Unable to assess XI: Head is midline XII: Unable to assess  Motor: Oscillatory movements of all 4 extremities is rhythmic but waxes/wanes in intensity and is distractable for short periods while testing her responses to noxious stimuli.   Sensory: Will withdraw BLE briskly after sustained noxious plantar stimulation.  Deep Tendon Reflexes: 2+ and symmetric throughout Plantars: Right:  downgoing                           Left: downgoing Cerebellar/Gait: Unable to assess   Pertinent Labs:   Latest Reference Range & Units 05/04/21 06:26  Sodium 135 - 145 mmol/L 140  Potassium 3.5 - 5.1 mmol/L 4.1  Chloride 98 - 111 mmol/L 106  CO2 22 - 32 mmol/L 25  Glucose 70 - 99 mg/dL 90  BUN 6 - 20 mg/dL 5 (L)  Creatinine 7.67 - 1.00 mg/dL 2.09  Calcium 8.9 - 47.0 mg/dL 8.1 (L)  Anion gap 5 - 15  9  Magnesium 1.7 - 2.4 mg/dL 5.5 (H)  Alkaline Phosphatase 38 - 126 U/L 131 (H)  Albumin 3.5 - 5.0 g/dL 2.7 (L)  AST 15 - 41 U/L 137 (H)  ALT 0 - 44 U/L 69 (H)  Total Protein 6.5 - 8.1 g/dL 6.7  Total Bilirubin 0.3 - 1.2 mg/dL 1.1  GFR, Estimated >96 mL/min >60      Assessment: 25 year old post partum black female, presenting with continuous seizure-like activity 1. On exam, she continues to exhibit movements that appear most consistent with a functional etiology. Pursing movements of lips today is a new finding, also appearing most consistent with embellishment. No meningismus on exam.  2. Electrophysiology:  -EEG yesterday: This study is within normal limits. The excessive beta activity seen in the background is most likely due to the effect of benzodiazepine and is a benign EEG pattern. No seizures or epileptiform discharges were seen throughout the recording. Patient was noted to have multiple episodes of head shaking throughout  the study without concomitant EEG change. These episodes were NOT epileptic.   -LTM EEG report for today, 05/03/2021 1710 to 05/04/2021 1015:  This study is within normal limits. The excessive beta activity seen in the background is most likely due to the effect of benzodiazepine and is a benign EEG pattern. No seizures or epileptiform discharges were seen throughout the recording. Patient was noted to have multiple episodes of head shaking (yes-yes movements) and whole body shaking throughout the study without concomitant EEG change. These episodes were NOT  epileptic.  3. Patient has been under significant stress lately per mother.  4. Overall presentation most consistent with pseudoseizure.  5. HTN.  6. No fever or meningismus to suggest a meningitis.    Recommendations: 1. Obtain brain CT to assess for possible intracranial abnormalities since patient has not returned to baseline.  2. Reassure family during pseudoseizure spells 3. Discontinue LTM EEG 4. Psychology consult planned for today to discuss possible psychosocial stressors and coping mechanisms.  5. Ativan 2 mg IV PRN anxiety 6. HTN. Although EEG is negative, may have overlapping postpartum eclampsia. Would contact OB for assistance.  7. Correct metabolic abnormalities including Mg    Addendum: CT head: No acute intracranial abnormality. Specifically, no hemorrhage, hydrocephalus, mass lesion, acute infarction, or significant intracranial injury.  Electronically signed: Dr. Caryl Pina

## 2021-05-04 NOTE — Progress Notes (Signed)
Pt ate applesauce and drank water with assistance of RN. Pt tolerated well. Pt also more verbal and oriented x4.

## 2021-05-04 NOTE — Progress Notes (Signed)
Pt care provided with repositioning at 0435. Pt asked for a drink, RN provided a moistened mouth swab. Pt reported relief, but unable to swallow resulting fluid. Suctioning provided. Pulse ox readings remained above 97%. Pt resting comfortably without distress. RN remains at bedside, due to blood administration. [facility], RN

## 2021-05-04 NOTE — Progress Notes (Signed)
Late entry:  Pt was seen this morning at 9 am  Gynecology Progress Note  Admission Date: 05/03/2021 Current Date: 05/04/2021 2:03 PM  Brenly Trawick is a 25 y.o. [id number] HD#1  PPD #7 admitted for postpartum preeclampsia  and neurological event   History complicated by: Patient Active Problem List   Diagnosis Date Noted   Preeclampsia in postpartum period 05/03/2021   Severe pre-eclampsia 05/02/2021   Gestational hypertension 04/26/2021   GBS (group B Streptococcus carrier), +RV culture, currently pregnant 04/16/2021   Encounter for supervision of normal pregnancy, antepartum 09/29/2020   History of gestational hypertension 04/21/2017   Sickle cell trait (HCC) 11/03/2016   Rubella non-immune status, antepartum 11/03/2016   Anemia in pregnancy 11/03/2016   S/P cesarean section 10/16/2016   Migraine with aura and without status migrainosus, not intractable 10/16/2014    ROS and patient/family/surgical history, located on admission H&P note dated 05/03/2021, have been reviewed, and there are no changes except as noted below Yesterday/Overnight Events:  none  Subjective:  Upon my arrival the neurology NP and family members were present.  Pt was awake, but minimally responsive.  She would follow some commands and occasionally speak.  The neuro team member explained to the family there had been no seizure activity on the EEG so a true seizure was unlikely.I explained that she does appear to have postpartum preeclampsia and is being appropriately treated with magnesium sulfate.  The pt does verbalize that her lower back hurts, but family is aware she did not have an epidural with this delivery.  Neuro team did note that a head CT would be ordered to address the family's concerns.  Awaiting psychiatry and social work consults for further evaluation.  Pt is also s/p 3 units PRBC due to severe anemia on arrival.  Objective:   Vitals:   05/04/21 1150 05/04/21 1155 05/04/21 1200 05/04/21  1206  BP:    126/85  Pulse:    94  Resp:    15  Temp:    98.2 F (36.8 C)  TempSrc:    Axillary  SpO2: 100% 100% 100% 100%    Temp:  [97.7 F (36.5 C)-98.9 F (37.2 C)] 98.2 F (36.8 C) (11/30 1206) Pulse Rate:  [64-96] 94 (11/30 1206) Resp:  [13-31] 15 (11/30 1206) BP: (126-182)/(77-113) 126/85 (11/30 1206) SpO2:  [78 %-100 %] 100 % (11/30 1206) I/O last 3 completed shifts: In: 1394.2 [I.V.:597.2; Blood:697; IV Piggyback:100] Out: 4200 [Urine:4200] Total I/O In: 635.9 [I.V.:299.9; Blood:336] Out: 1100 [Urine:1100]  Intake/Output Summary (Last 24 hours) at 05/04/2021 1403 Last data filed at 05/04/2021 1206 Gross per 24 hour  Intake 2030.11 ml  Output 5300 ml  Net -3269.89 ml     Current Vital Signs 24h Vital Sign Ranges  T 98.2 F (36.8 C) Temp  Avg: 98 F (36.7 C)  Min: 97.7 F (36.5 C)  Max: 98.9 F (37.2 C)  BP 126/85 BP  Min: 126/85  Max: 182/103  HR 94 Pulse  Avg: 79.1  Min: 64  Max: 96  RR 15 Resp  Avg: 21.2  Min: 13  Max: 31  SaO2 100 % Nasal Cannula SpO2  Avg: 99.5 %  Min: 78 %  Max: 100 %       24 Hour I/O Current Shift I/O  Time Ins Outs 11/29 0701 - 11/30 0700 In: 1394.2 [I.V.:597.2] Out: 4200 [Urine:4200] 11/30 0701 - 11/30 1900 In: 635.9 [I.V.:299.9] Out: 1100 [Urine:1100]   Patient Vitals for the past 12 hrs:  BP Temp Temp src Pulse Resp SpO2  05/04/21 1206 126/85 98.2 F (36.8 C) Axillary 94 15 100 %  05/04/21 1200 -- -- -- -- -- 100 %  05/04/21 1155 -- -- -- -- -- 100 %  05/04/21 1150 -- -- -- -- -- 100 %  05/04/21 1145 -- -- -- -- -- 99 %  05/04/21 1140 -- -- -- -- -- 100 %  05/04/21 1135 -- -- -- -- -- 100 %  05/04/21 1125 -- -- -- -- -- 100 %  05/04/21 1120 -- -- -- -- -- 100 %  05/04/21 1115 -- -- -- -- -- 100 %  05/04/21 1110 -- -- -- -- -- 100 %  05/04/21 1105 -- -- -- -- -- 100 %  05/04/21 1100 -- -- -- -- -- 100 %  05/04/21 1055 -- -- -- -- -- 100 %  05/04/21 1050 -- -- -- -- -- 100 %  05/04/21 1045 -- -- -- -- -- 100 %   05/04/21 1040 -- -- -- -- -- 100 %  05/04/21 1035 -- -- -- -- -- 100 %  05/04/21 1030 -- -- -- -- -- 100 %  05/04/21 1025 -- -- -- -- -- 100 %  05/04/21 1020 -- -- -- -- -- 100 %  05/04/21 1019 -- -- -- -- -- 100 %  05/04/21 1015 -- -- -- -- -- 100 %  05/04/21 1010 -- -- -- -- -- 100 %  05/04/21 1005 -- -- -- -- -- 100 %  05/04/21 1000 -- -- -- -- -- 100 %  05/04/21 0955 -- -- -- -- -- 100 %  05/04/21 0950 -- -- -- -- -- 100 %  05/04/21 0945 -- -- -- -- -- 100 %  05/04/21 0940 -- -- -- -- -- 99 %  05/04/21 0935 -- -- -- -- -- 100 %  05/04/21 0925 -- -- -- -- -- 100 %  05/04/21 0920 -- -- -- -- -- 100 %  05/04/21 0915 -- -- -- -- -- 100 %  05/04/21 0910 -- -- -- -- -- 100 %  05/04/21 0905 -- -- -- -- -- 100 %  05/04/21 0900 -- -- -- -- -- 100 %  05/04/21 0855 -- -- -- -- -- 100 %  05/04/21 0850 -- -- -- -- -- 100 %  05/04/21 0845 -- -- -- -- -- 100 %  05/04/21 0840 -- -- -- -- -- 100 %  05/04/21 0835 -- -- -- -- -- 100 %  05/04/21 0830 -- -- -- -- -- 100 %  05/04/21 0825 -- -- -- -- -- 100 %  05/04/21 0820 -- -- -- -- -- 100 %  05/04/21 0815 -- -- -- -- -- 98 %  05/04/21 0810 -- -- -- -- -- 100 %  05/04/21 0805 -- -- -- -- -- 100 %  05/04/21 0800 -- -- -- -- -- 99 %  05/04/21 0750 -- -- -- -- -- 94 %  05/04/21 0745 -- -- -- -- -- (!) 78 %  05/04/21 0737 (!) 150/97 98 F (36.7 C) Axillary 96 15 --  05/04/21 0735 -- -- -- -- -- 99 %  05/04/21 0730 -- -- -- -- -- 99 %  05/04/21 0725 -- -- -- -- -- 100 %  05/04/21 0720 -- -- -- -- -- 100 %  05/04/21 0715 -- -- -- -- -- 100 %  05/04/21 0710 -- -- -- -- -- 100 %  05/04/21 0705 -- -- -- -- --  100 %  05/04/21 0700 -- -- -- -- -- 100 %  05/04/21 0655 -- -- -- -- -- 100 %  05/04/21 0650 -- -- -- -- -- 100 %  05/04/21 0645 -- -- -- -- -- 100 %  05/04/21 0640 -- -- -- -- -- 100 %  05/04/21 0635 -- -- -- -- -- 100 %  05/04/21 0630 -- -- -- -- -- 100 %  05/04/21 0625 -- -- -- -- -- 100 %  05/04/21 0620 -- -- -- -- -- 100 %   05/04/21 0616 139/84 97.8 F (36.6 C) Axillary 87 15 --  05/04/21 0615 -- -- -- -- -- 100 %  05/04/21 0610 -- -- -- -- -- 100 %  05/04/21 0609 (!) 162/104 -- -- 84 -- --  05/04/21 0605 -- -- -- -- -- 100 %  05/04/21 0600 -- -- -- -- -- 100 %  05/04/21 0555 -- -- -- -- -- 100 %  05/04/21 0550 -- -- -- -- -- 100 %  05/04/21 0545 -- -- -- -- -- 100 %  05/04/21 0540 -- -- -- -- -- 100 %  05/04/21 0535 -- -- -- -- -- 100 %  05/04/21 0530 -- -- -- -- -- 100 %  05/04/21 0525 -- -- -- -- -- 100 %  05/04/21 0520 -- -- -- -- -- 100 %  05/04/21 0515 -- -- -- -- -- 100 %  05/04/21 0510 -- -- -- -- -- 100 %  05/04/21 0505 -- -- -- -- -- 100 %  05/04/21 0500 -- -- -- -- -- 99 %  05/04/21 0455 (!) 148/96 97.7 F (36.5 C) Axillary 88 18 98 %  05/04/21 0452 (!) 148/96 97.7 F (36.5 C) Axillary 88 18 98 %  05/04/21 0445 -- -- -- -- -- 99 %  05/04/21 0440 -- -- -- -- -- 100 %  05/04/21 0435 -- -- -- -- -- 99 %  05/04/21 0430 -- -- -- -- -- 100 %  05/04/21 0425 -- -- -- -- -- 100 %  05/04/21 0423 (!) 142/86 98.1 F (36.7 C) Axillary 81 19 100 %  05/04/21 0420 -- -- -- -- -- 100 %  05/04/21 0415 -- -- -- -- -- 100 %  05/04/21 0410 -- -- -- -- -- 100 %  05/04/21 0405 -- -- -- -- -- 100 %  05/04/21 0400 -- -- -- -- -- 100 %  05/04/21 0334 (!) 157/105 97.7 F (36.5 C) Axillary 87 18 100 %  05/04/21 0232 (!) 145/89 -- -- 73 -- 100 %     Patient Vitals for the past 24 hrs:  BP Temp Temp src Pulse Resp SpO2  05/04/21 1206 126/85 98.2 F (36.8 C) Axillary 94 15 100 %  05/04/21 1200 -- -- -- -- -- 100 %  05/04/21 1155 -- -- -- -- -- 100 %  05/04/21 1150 -- -- -- -- -- 100 %  05/04/21 1145 -- -- -- -- -- 99 %  05/04/21 1140 -- -- -- -- -- 100 %  05/04/21 1135 -- -- -- -- -- 100 %  05/04/21 1125 -- -- -- -- -- 100 %  05/04/21 1120 -- -- -- -- -- 100 %  05/04/21 1115 -- -- -- -- -- 100 %  05/04/21 1110 -- -- -- -- -- 100 %  05/04/21 1105 -- -- -- -- -- 100 %  05/04/21 1100 -- -- -- -- --  100 %  05/04/21 1055 -- -- -- -- --  100 %  05/04/21 1050 -- -- -- -- -- 100 %  05/04/21 1045 -- -- -- -- -- 100 %  05/04/21 1040 -- -- -- -- -- 100 %  05/04/21 1035 -- -- -- -- -- 100 %  05/04/21 1030 -- -- -- -- -- 100 %  05/04/21 1025 -- -- -- -- -- 100 %  05/04/21 1020 -- -- -- -- -- 100 %  05/04/21 1019 -- -- -- -- -- 100 %  05/04/21 1015 -- -- -- -- -- 100 %  05/04/21 1010 -- -- -- -- -- 100 %  05/04/21 1005 -- -- -- -- -- 100 %  05/04/21 1000 -- -- -- -- -- 100 %  05/04/21 0955 -- -- -- -- -- 100 %  05/04/21 0950 -- -- -- -- -- 100 %  05/04/21 0945 -- -- -- -- -- 100 %  05/04/21 0940 -- -- -- -- -- 99 %  05/04/21 0935 -- -- -- -- -- 100 %  05/04/21 0925 -- -- -- -- -- 100 %  05/04/21 0920 -- -- -- -- -- 100 %  05/04/21 0915 -- -- -- -- -- 100 %  05/04/21 0910 -- -- -- -- -- 100 %  05/04/21 0905 -- -- -- -- -- 100 %  05/04/21 0900 -- -- -- -- -- 100 %  05/04/21 0855 -- -- -- -- -- 100 %  05/04/21 0850 -- -- -- -- -- 100 %  05/04/21 0845 -- -- -- -- -- 100 %  05/04/21 0840 -- -- -- -- -- 100 %  05/04/21 0835 -- -- -- -- -- 100 %  05/04/21 0830 -- -- -- -- -- 100 %  05/04/21 0825 -- -- -- -- -- 100 %  05/04/21 0820 -- -- -- -- -- 100 %  05/04/21 0815 -- -- -- -- -- 98 %  05/04/21 0810 -- -- -- -- -- 100 %  05/04/21 0805 -- -- -- -- -- 100 %  05/04/21 0800 -- -- -- -- -- 99 %  05/04/21 0750 -- -- -- -- -- 94 %  05/04/21 0745 -- -- -- -- -- (!) 78 %  05/04/21 0737 (!) 150/97 98 F (36.7 C) Axillary 96 15 --  05/04/21 0735 -- -- -- -- -- 99 %  05/04/21 0730 -- -- -- -- -- 99 %  05/04/21 0725 -- -- -- -- -- 100 %  05/04/21 0720 -- -- -- -- -- 100 %  05/04/21 0715 -- -- -- -- -- 100 %  05/04/21 0710 -- -- -- -- -- 100 %  05/04/21 0705 -- -- -- -- -- 100 %  05/04/21 0700 -- -- -- -- -- 100 %  05/04/21 0655 -- -- -- -- -- 100 %  05/04/21 0650 -- -- -- -- -- 100 %  05/04/21 0645 -- -- -- -- -- 100 %  05/04/21 0640 -- -- -- -- -- 100 %  05/04/21 0635 -- -- -- -- -- 100  %  05/04/21 0630 -- -- -- -- -- 100 %  05/04/21 0625 -- -- -- -- -- 100 %  05/04/21 0620 -- -- -- -- -- 100 %  05/04/21 0616 139/84 97.8 F (36.6 C) Axillary 87 15 --  05/04/21 0615 -- -- -- -- -- 100 %  05/04/21 0610 -- -- -- -- -- 100 %  05/04/21 0609 (!) 162/104 -- -- 84 -- --  05/04/21 0605 -- -- -- -- -- 100 %  05/04/21 0600 -- -- -- -- -- 100 %  05/04/21 0555 -- -- -- -- -- 100 %  05/04/21 0550 -- -- -- -- -- 100 %  05/04/21 0545 -- -- -- -- -- 100 %  05/04/21 0540 -- -- -- -- -- 100 %  05/04/21 0535 -- -- -- -- -- 100 %  05/04/21 0530 -- -- -- -- -- 100 %  05/04/21 0525 -- -- -- -- -- 100 %  05/04/21 0520 -- -- -- -- -- 100 %  05/04/21 0515 -- -- -- -- -- 100 %  05/04/21 0510 -- -- -- -- -- 100 %  05/04/21 0505 -- -- -- -- -- 100 %  05/04/21 0500 -- -- -- -- -- 99 %  05/04/21 0455 (!) 148/96 97.7 F (36.5 C) Axillary 88 18 98 %  05/04/21 0452 (!) 148/96 97.7 F (36.5 C) Axillary 88 18 98 %  05/04/21 0445 -- -- -- -- -- 99 %  05/04/21 0440 -- -- -- -- -- 100 %  05/04/21 0435 -- -- -- -- -- 99 %  05/04/21 0430 -- -- -- -- -- 100 %  05/04/21 0425 -- -- -- -- -- 100 %  05/04/21 0423 (!) 142/86 98.1 F (36.7 C) Axillary 81 19 100 %  05/04/21 0420 -- -- -- -- -- 100 %  05/04/21 0415 -- -- -- -- -- 100 %  05/04/21 0410 -- -- -- -- -- 100 %  05/04/21 0405 -- -- -- -- -- 100 %  05/04/21 0400 -- -- -- -- -- 100 %  05/04/21 0334 (!) 157/105 97.7 F (36.5 C) Axillary 87 18 100 %  05/04/21 0232 (!) 145/89 -- -- 73 -- 100 %  05/04/21 0137 (!) 148/95 -- -- 77 19 100 %  05/04/21 0038 127/84 97.8 F (36.6 C) Axillary 72 18 100 %  05/04/21 0037 127/84 97.8 F (36.6 C) Axillary 72 18 100 %  05/04/21 0015 (!) 147/78 97.7 F (36.5 C) Axillary 71 20 100 %  05/03/21 2353 127/84 97.8 F (36.6 C) -- 71 18 --  05/03/21 2300 127/77 97.7 F (36.5 C) Axillary 72 17 100 %  05/03/21 2222 (!) 147/94 -- -- 88 -- 100 %  05/03/21 2116 (!) 144/91 98.1 F (36.7 C) Axillary 77 17 100 %   05/03/21 2115 (!) 144/91 97.8 F (36.6 C) Axillary 76 18 100 %  05/03/21 2100 (!) 155/97 98.2 F (36.8 C) Axillary 80 18 100 %  05/03/21 1914 (!) 153/96 98.5 F (36.9 C) Axillary 77 17 100 %  05/03/21 1904 (!) 160/98 -- -- 79 -- --  05/03/21 1855 -- -- -- -- -- 99 %  05/03/21 1850 -- -- -- -- -- 95 %  05/03/21 1848 (!) 165/90 98.4 F (36.9 C) Axillary 64 (!) 24 (!) 88 %  05/03/21 1825 130/89 -- -- -- (!) 23 --  05/03/21 1815 (!) 143/94 -- -- 69 (!) 23 100 %  05/03/21 1800 (!) 132/94 -- -- -- (!) 24 --  05/03/21 1745 (!) 137/94 -- -- -- (!) 24 --  05/03/21 1730 (!) 159/98 -- -- -- (!) 29 --  05/03/21 1715 (!) 169/106 -- -- 78 (!) 22 100 %  05/03/21 1700 (!) 167/102 -- -- 84 (!) 29 100 %  05/03/21 1645 (!) 156/104 -- -- 74 (!) 29 100 %  05/03/21 1630 (!) 159/102 -- -- 70 (!) 26 100 %  05/03/21 1625 (!) 156/110 -- -- 75 (!) 27 100 %  05/03/21 1620 (!) 164/105 -- -- 89 20 100 %  05/03/21 1615 (!) 182/103 -- -- 90 (!) 23 100 %  05/03/21 1610 (!) 161/103 -- -- 74 (!) 25 100 %  05/03/21 1605 (!) 152/101 -- -- 89 (!) 22 100 %  05/03/21 1600 (!) 165/105 -- -- 80 (!) 29 100 %  05/03/21 1551 -- 98.9 F (37.2 C) Axillary -- -- --  05/03/21 1545 (!) 163/103 -- -- 76 (!) 31 100 %  05/03/21 1538 (!) 158/113 -- -- 74 (!) 22 100 %  05/03/21 1537 (!) 166/107 -- -- 73 13 100 %    Physical exam: General appearance: delirious, mild distress, slowed mentation, and uncooperative Abdomen: soft, non-tender; bowel sounds normal; no masses,  no organomegaly GU: No gross VB Lungs: clear to auscultation bilaterally Heart: regular rate and rhythm Extremities: no lower extremity edema Skin: WNL Psych: cannot evaluate, pt not fully responsive Neurologic: Mental status: alertness: lethargic, affect: inappropriate Motor: some directed movements seen  Medications Current Facility-Administered Medications  Medication Dose Route Frequency Provider Last Rate Last Admin   0.9 %  sodium chloride infusion  (Manually program via Guardrails IV Fluids)   Intravenous Once Constant, Peggy, MD 10 mL/hr at 05/03/21 2300 Rate Verify at 05/03/21 2300   acetaminophen (TYLENOL) tablet 650 mg  650 mg Oral Q4H PRN Constant, Peggy, MD       calcium carbonate (TUMS - dosed in mg elemental calcium) chewable tablet 400 mg of elemental calcium  2 tablet Oral Q4H PRN Constant, Peggy, MD       docusate sodium (COLACE) capsule 100 mg  100 mg Oral Daily Constant, Peggy, MD       labetalol (NORMODYNE) injection 20 mg  20 mg Intravenous PRN Constant, Peggy, MD       And   labetalol (NORMODYNE) injection 40 mg  40 mg Intravenous PRN Constant, Peggy, MD       And   labetalol (NORMODYNE) injection 80 mg  80 mg Intravenous PRN Constant, Peggy, MD       And   hydrALAZINE (APRESOLINE) injection 10 mg  10 mg Intravenous PRN Constant, Peggy, MD       HYDROmorphone (DILAUDID) injection 0.5 mg  0.5 mg Intravenous Q3H PRN Warden Fillers, MD       magnesium sulfate 40 grams in SWI 1000 mL OB infusion  2 g/hr Intravenous Continuous Constant, Peggy, MD 50 mL/hr at 05/03/21 2027 2 g/hr at 05/03/21 2027   MEDLINE mouth rinse  15 mL Mouth Rinse BID Constant, Peggy, MD       NIFEdipine (PROCARDIA-XL/NIFEDICAL-XL) 24 hr tablet 30 mg  30 mg Oral Daily Constant, Peggy, MD       oxyCODONE-acetaminophen (PERCOCET/ROXICET) 5-325 MG per tablet 1-2 tablet  1-2 tablet Oral Q6H PRN Constant, Peggy, MD       prenatal multivitamin tablet 1 tablet  1 tablet Oral Q1200 Constant, Peggy, MD          Labs  Recent Labs  Lab 04/28/21 0857 05/03/21 1541 05/04/21 0946  WBC 13.7* 8.7 11.3*  HGB 10.2* 5.6* 10.7*  HCT 31.1* 18.2* 31.6*  PLT 302 384 426*    Recent Labs  Lab 04/28/21 0857 05/03/21 1541 05/04/21 0626  NA 134* 139 140  K 3.7 4.1 4.1  CL 101 108 106  CO2 BUN <5* 11 5*  CREATININE 0.74 0.83 0.70  CALCIUM 7.5* 8.6* 8.1*  PROT 7.2 6.0* 6.7  BILITOT 1.0 0.5 1.1  ALKPHOS 271* 145* 131*  ALT 17 37 69*  AST 24 63*  137*  GLUCOSE 113* 83 90    Radiology CT of head is pending  Assessment & Plan:   1 Neuro:  CT of head pending.  Appreciate neurology assistance.  No further seizure activity noted and currently none noted on EEG.  Will try to rule out any social or infectious causes of this behavior.  2 preeclampsia:    -  continue magnesium sulfate until 1700.     -  Will start po procardia once pt is more alert and can safely swallow pills and liquid   -will give IV anti-hypertensives  if needed for BP control.   -  check CMP in AM  3 anemia: pt stable after 3 units PRBC      Code Status: Full Code  Mariel Aloe, MD  [facility])

## 2021-05-04 NOTE — Progress Notes (Signed)
Post Partum Day 6 Subjective: Patient more responsive since admission reporting thirst and lower back pain. Patient denies a headache or abdominal pain.  Objective: Blood pressure 139/84, pulse 87, temperature 97.8 F (36.6 C), temperature source Axillary, resp. rate 15, SpO2 98 %, unknown if currently breastfeeding.  Physical Exam:  General: alert, appears stated age, no distress, and slowed mentation Lochia: appropriate Uterine Fundus: firm DVT Evaluation: No evidence of DVT seen on physical exam.  Recent Labs    05/03/21 1541  HGB 5.6*  HCT 18.2*    Assessment/Plan: 1) Neuro - Patient without evidence of seizure activity on EEG - Consider brain MRI if not back to baseline later this morning - Psych consult placed - Social work consult also placed to assess needs  2) Pre-eclampsia - Continue magnesium sulfate for seizure prophylaxis - Patient did not have severe range BP overnight and thus has not required any antihypertensive - Continue close BP monitoring - Plan to start procardia when patient is able to swallow - Follow up labs this am - Good urine output overnight  3) Anemia - Patient completed 3rd unit of pRBC - Follow up post transfusion H/H  Continue close monitoring    LOS: 1 day   Heather Noble 05/04/2021, 6:54 AM

## 2021-05-04 NOTE — Progress Notes (Signed)
LTM EEG discontinued - no skin breakdown at unhook.   

## 2021-05-04 NOTE — Consult Note (Signed)
[facility] Face-to-Face Psychiatry Consult   Reason for Consult:  Concern for PNES Referring Physician:  Mora Bellman, MD Patient Identification: Bristol Gauthreaux MRN:  [id number] Principal Diagnosis: Preeclampsia in postpartum period Diagnosis:  Principal Problem:   Preeclampsia in postpartum period  Assessment  Miia Aburto is a 25 y.o. female admitted medically 05/03/2021  3:33 PM for post partum seizure-like activity.Patient carries no dx psychiatric hx has a past medical history of  currently post-partum 6 days, P2,  .Psychiatry was consulted for concern for PNES.   On initial examination, patient appears to be in a significant amount of pain and has a concerning neurological exam. We plan to recommend that patient receive further workup to rule out other causes for abnormal presentation.   On assessment today patient appears to be in a significant level of pain. Patient's RN noted that patient became verbal today and endorsed that she had had poor vision yesterday on exam. At this time we recommend that PNES be lower on differential as patient has abnormal labs (anemia requiring 3U pRBCs, desating into 70's, hypertension, transaminitis trending upward) however what is more promising is that patient has been afebrile and does not have leukocytosis. MRI is highly recommended to rule out neurological causes. Hx that does suggest a dx of conversion disorder (FNSD) includes patient's mother endorsing that patient had a presentation of brief withdrawal after the death of her grandfather in 09-30-09; however mom reports that patient returned to baseline in between the episodes and was completley back at baseline in 1 week. On this presentation patient has not returned to baseline and continues to endorse pain. Attempted at [facility], however patient does meet min screening requirements for catatonia .There is low concern for post-partum psychosis as patient has not been endorsing AVH or delusions and is  interested in seeing her child. Mom did endorse that the patient has been having more life stressors recently; however again would recommend ruling out further medical etiology.   Recommend - MRI - Appreciate Neurology insight  Thank you for this consult. We will continue to follow.  Total Time spent with patient: 45 minutes  Subjective:   Miller Reffner is a 25 y.o. female patient admitted with seizure-like activity post partum.  HPI:  Patient is a 25 yo who appears to Aox4. Patient reports that she is "in pain" today. Patient denies SI, HI and AVH. Patient reports that she has been physically feeling worse since the birth of her son Olen Cordial.   RN reports that patient has been talking today. RN reports that she received information in report that patient did not talk yesterday but this AM she was able to talk. Patient reportedly said that " I can see now!"  Patient's mom reports that the patient suddenly stopped talking around 2:33 pm- 3 AM. Patient mom reports that there was a similar episode "but not this bad" in 2009/09/30 when they were at her grandfather's funeral. Mom reports she became a bit withdrawn and was dx with anxiety in the ED and provided a one time medication and sent home after e few hrs in the ED. Mom reports that patient had 3 episodes in total over that week where she was withdrawn and then returned completely back to baseline.   Past Psychiatric History: Anxiety? dx in the ED in 09/30/2009 per mom, not mentioned in EMR  Risk to Self:  LOW Risk to Others:  LOW Prior Inpatient Therapy: NO  Prior Outpatient Therapy:  NO   Past Medical History:  Past Medical History:  Diagnosis Date   Headache    Pregnancy induced hypertension     Past Surgical History:  Procedure Laterality Date   CESAREAN SECTION N/A 04/21/2017   Procedure: CESAREAN SECTION;  Surgeon: Osborne Oman, MD;  Location: [place];  Service: Obstetrics;  Laterality: N/A;   WISDOM TOOTH EXTRACTION   2015   Family History:  Family History  Problem Relation Age of Onset   Hypertension Maternal Grandmother    Osteoarthritis Maternal Grandmother    Gout Maternal Grandmother    Thyroid disease Maternal Grandmother    Diabetes Maternal Grandmother    Cancer Paternal Grandfather        unknown   Family Psychiatric  History: Mother dx w/ anxiety on Venlafaxine and has migraines on Emgality. Multiple 2nd cousins with Bipolar disorder and schizophrenia. Social History:  Social History   Substance and Sexual Activity  Alcohol Use No   Comment: occ     Social History   Substance and Sexual Activity  Drug Use No    Social History   Socioeconomic History   Marital status: Single    Spouse name: Not on file   Number of children: Not on file   Years of education: Not on file   Highest education level: Not on file  Occupational History   Not on file  Tobacco Use   Smoking status: Never   Smokeless tobacco: Never  Vaping Use   Vaping Use: Never used  Substance and Sexual Activity   Alcohol use: No    Comment: occ   Drug use: No   Sexual activity: Yes    Partners: Male    Birth control/protection: None  Other Topics Concern   Not on file  Social History Narrative   Not on file   Social Determinants of Health   Financial Resource Strain: Not on file  Food Insecurity: Not on file  Transportation Needs: Not on file  Physical Activity: Not on file  Stress: Not on file  Social Connections: Not on file   Additional Social History:    Allergies:  No Known Allergies  Labs:  Results for orders placed or performed during the hospital encounter of 05/03/21 (from the past 48 hour(s))  Comprehensive metabolic panel     Status: Abnormal   Collection Time: 05/03/21  3:41 PM  Result Value Ref Range   Sodium 139 135 - 145 mmol/L   Potassium 4.1 3.5 - 5.1 mmol/L   Chloride 108 98 - 111 mmol/L   CO2 26 22 - 32 mmol/L   Glucose, Bld 83 70 - 99 mg/dL    Comment: Glucose  reference range applies only to samples taken after fasting for at least 8 hours.   BUN 11 6 - 20 mg/dL   Creatinine, Ser 0.83 0.44 - 1.00 mg/dL   Calcium 8.6 (L) 8.9 - 10.3 mg/dL   Total Protein 6.0 (L) 6.5 - 8.1 g/dL   Albumin 2.5 (L) 3.5 - 5.0 g/dL   AST 63 (H) 15 - 41 U/L   ALT 37 0 - 44 U/L   Alkaline Phosphatase 145 (H) 38 - 126 U/L   Total Bilirubin 0.5 0.3 - 1.2 mg/dL   GFR, Estimated >60 >60 mL/min    Comment: (NOTE) Calculated using the CKD-EPI Creatinine Equation (2021)    Anion gap 5 5 - 15    Comment: Performed at [facility], [place] [address] [place], [place] [address]  Ethanol  Status: None   Collection Time: 05/03/21  3:41 PM  Result Value Ref Range   Alcohol, Ethyl (B) <10 <10 mg/dL    Comment: (NOTE) Lowest detectable limit for serum alcohol is 10 mg/dL.  For medical purposes only. Performed at [facility], [place] [address] [place], [place] [address]   CBC with Differential     Status: Abnormal   Collection Time: 05/03/21  3:41 PM  Result Value Ref Range   WBC 8.7 4.0 - 10.5 K/uL   RBC 1.87 (L) 3.87 - 5.11 MIL/uL   Hemoglobin 5.6 (LL) 12.0 - 15.0 g/dL    Comment: REPEATED TO VERIFY THIS CRITICAL RESULT HAS VERIFIED AND BEEN CALLED TO M GILLEY,RN BY WALTER BOND ON 11 29 2022 AT [id number], AND HAS BEEN READ BACK.     HCT 18.2 (L) 36.0 - 46.0 %   MCV 97.3 80.0 - 100.0 fL   MCH 29.9 26.0 - 34.0 pg   MCHC 30.8 30.0 - 36.0 g/dL   RDW 13.3 11.5 - 15.5 %   Platelets 384 150 - 400 K/uL   nRBC 0.3 (H) 0.0 - 0.2 %   Neutrophils Relative % 57 %   Neutro Abs 5.0 1.7 - 7.7 K/uL   Lymphocytes Relative 30 %   Lymphs Abs 2.6 0.7 - 4.0 K/uL   Monocytes Relative 10 %   Monocytes Absolute 0.8 0.1 - 1.0 K/uL   Eosinophils Relative 1 %   Eosinophils Absolute 0.1 0.0 - 0.5 K/uL   Basophils Relative 0 %   Basophils Absolute 0.0 0.0 - 0.1 K/uL   Immature Granulocytes 2 %   Abs Immature Granulocytes 0.17 (H) 0.00 - 0.07 K/uL    Comment: Performed at [facility] [address], [place], [place] [address]  Prepare RBC (crossmatch)     Status: None   Collection Time: 05/03/21  4:54 PM  Result Value Ref Range   Order Confirmation      ORDER PROCESSED BY BLOOD BANK Performed at [facility], [place] [address], [place], [place] [address]   Type and screen [place]     Status: None (Preliminary result)   Collection Time: 05/03/21  5:03 PM  Result Value Ref Range   ABO/RH(D) A POS    Antibody Screen NEG    Sample Expiration 05/06/2021,2359    Unit Number [id number]    Blood Component Type RED CELLS,LR    Unit division 00    Status of Unit ISSUED,FINAL    Transfusion Status OK TO TRANSFUSE    Crossmatch Result      Compatible Performed at [facility], [place] [address] [place], [place] [address]    Unit Number [id number]    Blood Component Type RED CELLS,LR    Unit division 00    Status of Unit ISSUED    Transfusion Status OK TO TRANSFUSE    Crossmatch Result Compatible    Unit Number [id number]    Blood Component Type RED CELLS,LR    Unit division 00    Status of Unit ISSUED    Transfusion Status OK TO TRANSFUSE    Crossmatch Result Compatible   Resp Panel by RT-PCR (Flu A&B, Covid) Nasopharyngeal Swab     Status: None   Collection Time: 05/03/21  5:21 PM   Specimen: Nasopharyngeal Swab; Nasopharyngeal(NP) swabs in vial transport medium  Result Value Ref Range   SARS Coronavirus 2 by RT PCR NEGATIVE NEGATIVE  Comment: (NOTE) SARS-CoV-2 target nucleic acids are NOT DETECTED.  The SARS-CoV-2 RNA is generally detectable in upper respiratory specimens during the acute phase of infection. The lowest concentration of SARS-CoV-2 viral copies this assay can detect is 138 copies/mL. A negative result does not preclude SARS-Cov-2 infection and should not be used as the sole basis for treatment or other patient management decisions. A negative result may occur with  improper specimen  collection/handling, submission of specimen other than nasopharyngeal swab, presence of viral mutation(s) within the areas targeted by this assay, and inadequate number of viral copies(<138 copies/mL). A negative result must be combined with clinical observations, patient history, and epidemiological information. The expected result is Negative.  Fact Sheet for Patients:  EntrepreneurPulse.com.au  Fact Sheet for Healthcare Providers:  IncredibleEmployment.be  This test is no t yet approved or cleared by the [place] FDA and  has been authorized for detection and/or diagnosis of SARS-CoV-2 by FDA under an Emergency Use Authorization (EUA). This EUA will remain  in effect (meaning this test can be used) for the duration of the COVID-19 declaration under Section 564(b)(1) of the Act, 21 U.S.C.section 360bbb-3(b)(1), unless the authorization is terminated  or revoked sooner.       Influenza A by PCR NEGATIVE NEGATIVE   Influenza B by PCR NEGATIVE NEGATIVE    Comment: (NOTE) The Xpert Xpress SARS-CoV-2/FLU/RSV plus assay is intended as an aid in the diagnosis of influenza from Nasopharyngeal swab specimens and should not be used as a sole basis for treatment. Nasal washings and aspirates are unacceptable for Xpert Xpress SARS-CoV-2/FLU/RSV testing.  Fact Sheet for Patients: EntrepreneurPulse.com.au  Fact Sheet for Healthcare Providers: IncredibleEmployment.be  This test is not yet approved or cleared by the [place] FDA and has been authorized for detection and/or diagnosis of SARS-CoV-2 by FDA under an Emergency Use Authorization (EUA). This EUA will remain in effect (meaning this test can be used) for the duration of the COVID-19 declaration under Section 564(b)(1) of the Act, 21 U.S.C. section 360bbb-3(b)(1), unless the authorization is terminated or revoked.  Performed at [facility], [place] [address] [place], [place] [address]   Prepare RBC (crossmatch)     Status: None   Collection Time: 05/03/21  5:36 PM  Result Value Ref Range   Order Confirmation      ORDER PROCESSED BY BLOOD BANK Performed at [facility], [place] [address], [place], [place] [address]   Comprehensive metabolic panel     Status: Abnormal   Collection Time: 05/04/21  6:26 AM  Result Value Ref Range   Sodium 140 135 - 145 mmol/L   Potassium 4.1 3.5 - 5.1 mmol/L   Chloride 106 98 - 111 mmol/L   CO2 25 22 - 32 mmol/L   Glucose, Bld 90 70 - 99 mg/dL    Comment: Glucose reference range applies only to samples taken after fasting for at least 8 hours.   BUN 5 (L) 6 - 20 mg/dL   Creatinine, Ser 0.70 0.44 - 1.00 mg/dL   Calcium 8.1 (L) 8.9 - 10.3 mg/dL   Total Protein 6.7 6.5 - 8.1 g/dL   Albumin 2.7 (L) 3.5 - 5.0 g/dL   AST 137 (H) 15 - 41 U/L   ALT 69 (H) 0 - 44 U/L   Alkaline Phosphatase 131 (H) 38 - 126 U/L   Total Bilirubin 1.1 0.3 - 1.2 mg/dL   GFR, Estimated >60 >60 mL/min    Comment: (NOTE) Calculated using  the CKD-EPI Creatinine Equation (2021)    Anion gap 9 5 - 15    Comment: Performed at [facility], [address] [place], [place] [address]  Magnesium     Status: Abnormal   Collection Time: 05/04/21  6:26 AM  Result Value Ref Range   Magnesium 5.5 (H) 1.7 - 2.4 mg/dL    Comment: Performed at [facility], [address] [place], [place] [address]  CBC with Differential/Platelet     Status: Abnormal   Collection Time: 05/04/21  9:46 AM  Result Value Ref Range   WBC 11.3 (H) 4.0 - 10.5 K/uL   RBC 3.61 (L) 3.87 - 5.11 MIL/uL   Hemoglobin 10.7 (L) 12.0 - 15.0 g/dL    Comment: REPEATED TO VERIFY POST TRANSFUSION SPECIMEN    HCT 31.6 (L) 36.0 - 46.0 %   MCV 87.5 80.0 - 100.0 fL    Comment: POST TRANSFUSION SPECIMEN REPEATED TO VERIFY    MCH 29.6 26.0 - 34.0 pg   MCHC 33.9 30.0 - 36.0 g/dL   RDW 49.7 (H) 02.6 - 37.8 %   Platelets 426 (H) 150 - 400 K/uL    nRBC 0.4 (H) 0.0 - 0.2 %   Neutrophils Relative % 72 %   Neutro Abs 8.1 (H) 1.7 - 7.7 K/uL   Lymphocytes Relative 15 %   Lymphs Abs 1.7 0.7 - 4.0 K/uL   Monocytes Relative 10 %   Monocytes Absolute 1.1 (H) 0.1 - 1.0 K/uL   Eosinophils Relative 0 %   Eosinophils Absolute 0.0 0.0 - 0.5 K/uL   Basophils Relative 0 %   Basophils Absolute 0.0 0.0 - 0.1 K/uL   Immature Granulocytes 3 %   Abs Immature Granulocytes 0.29 (H) 0.00 - 0.07 K/uL    Comment: Performed at [facility], [address], [place], [place] [address]    Current Facility-Administered Medications  Medication Dose Route Frequency Provider Last Rate Last Admin   0.9 %  sodium chloride infusion (Manually program via Guardrails IV Fluids)   Intravenous Once Constant, Peggy, MD 10 mL/hr at 05/03/21 2300 Rate Verify at 05/03/21 2300   acetaminophen (TYLENOL) tablet 650 mg  650 mg Oral Q4H PRN Constant, Peggy, MD       calcium carbonate (TUMS - dosed in mg elemental calcium) chewable tablet 400 mg of elemental calcium  2 tablet Oral Q4H PRN Constant, Peggy, MD       docusate sodium (COLACE) capsule 100 mg  100 mg Oral Daily Constant, Peggy, MD       labetalol (NORMODYNE) injection 20 mg  20 mg Intravenous PRN Constant, Peggy, MD       And   labetalol (NORMODYNE) injection 40 mg  40 mg Intravenous PRN Constant, Peggy, MD       And   labetalol (NORMODYNE) injection 80 mg  80 mg Intravenous PRN Constant, Peggy, MD       And   hydrALAZINE (APRESOLINE) injection 10 mg  10 mg Intravenous PRN Constant, Peggy, MD       HYDROmorphone (DILAUDID) injection 0.5 mg  0.5 mg Intravenous Once PRN Constant, Peggy, MD       magnesium sulfate 40 grams in SWI 1000 mL OB infusion  2 g/hr Intravenous Continuous Constant, Peggy, MD 50 mL/hr at 05/03/21 2027 2 g/hr at 05/03/21 2027   MEDLINE mouth rinse  15 mL Mouth Rinse BID Constant, Peggy, MD       NIFEdipine (PROCARDIA-XL/NIFEDICAL-XL) 24 hr  tablet 30 mg  30 mg Oral Daily Constant, Peggy, MD        oxyCODONE-acetaminophen (PERCOCET/ROXICET) 5-325 MG per tablet 1-2 tablet  1-2 tablet Oral Q6H PRN Constant, Peggy, MD       prenatal multivitamin tablet 1 tablet  1 tablet Oral Q1200 Constant, Peggy, MD         Psychiatric Specialty Exam:  Presentation  General Appearance: -- (Ill appearing)  Eye Contact:Minimal  Speech:Clear and Coherent  Speech Volume:Decreased  Handedness:No data recorded  Mood and Affect  Mood:-- ("in pain")  Affect:Congruent   Thought Process  Thought Processes:Goal Directed  Descriptions of Associations:Circumstantial  Orientation:Full (Time, Place and Person)  Thought Content:Logical  History of Schizophrenia/Schizoaffective disorder:No data recorded Duration of Psychotic Symptoms:No data recorded Hallucinations:Hallucinations: None  Ideas of Reference:None  Suicidal Thoughts:Suicidal Thoughts: No  Homicidal Thoughts:Homicidal Thoughts: No   Sensorium  Memory:Immediate Good  Judgment:Impaired  Insight:Present   Executive Functions  Concentration:Fair  Attention Span:Poor  Recall:No data recorded Fund of Knowledge:No data recorded Language:Fair   Psychomotor Activity  Psychomotor Activity:Psychomotor Activity: Decreased   Assets  Assets:Communication Skills; Housing; Social Support; Resilience   Sleep  Sleep:No data recorded  Physical Exam: Physical Exam Constitutional:      Appearance: She is ill-appearing.     Comments: Patient request to be rolled on her side for comfort  HENT:     Head: Normocephalic and atraumatic.  Eyes:     Comments: Abnormal EOM, not able to follow well but tracks Left to right. Abnormal (vertical nystagmus? In R eye) when attempting to have patient track on command  Neurological:     Mental Status: She is alert and oriented to person, place, and time.     Comments: Kernig +, brudzinski (-). Unable to passively flex BLE.  Pain worse at the lumbar spine on palpation than  paraspinal muscles  Patient near tears on flexion of the legs at the hip   Review of Systems  HENT:         Endorses headache, pain "all over" but worse in the front  Musculoskeletal:  Positive for back pain.  Neurological:  Positive for headaches.       Sterotypical movement noted in R side of face and lip  Psychiatric/Behavioral:  Negative for hallucinations and suicidal ideas.   Blood pressure (!) 150/97, pulse 96, temperature 98 F (36.7 C), temperature source Axillary, resp. rate 15, SpO2 100 %, unknown if currently breastfeeding. There is no height or weight on file to calculate BMI.  PGY-2 Freida Busman, MD 05/04/2021 11:36 AM

## 2021-05-04 NOTE — Progress Notes (Signed)
Notified CT department of pt still needing a CT scan. Transport en route to department.

## 2021-05-04 NOTE — Procedures (Signed)
Patient Name: Heather Noble  MRN: [id number]  Epilepsy Attending: Charlsie Quest  Referring Physician/Provider:  Dr Caryl Pina Duration: 05/03/2021 1710 to 05/04/2021 1015   Patient history: 25yo F with seizure like episodes. EEG to evaluate for seizure.    Level of alertness: Awake, asleep   AEDs during EEG study: ativan   Technical aspects: This EEG study was done with scalp electrodes positioned according to the 10-20 International system of electrode placement. Electrical activity was acquired at a sampling rate of 500Hz  and reviewed with a high frequency filter of 70Hz  and a low frequency filter of 1Hz . EEG data were recorded continuously and digitally stored.    Description: No posterior dominant rhythm consists of 9-10 Hz activity of moderate voltage (25-35 uV) seen predominantly in posterior head regions, symmetric and reactive to eye opening and eye closing. Sleep was characterized by vertex waves, sleep spindles (12 to 14 Hz), maximum frontocentral region. There is an excessive amount of 15 to 18 Hz beta activity distributed symmetrically and diffusely.    Patient was noted to have multiple episodes of head shaking ( yes-yes movements) and whole body shaking throughout the study.Concomitant EEG before, during and after the event did not show any EEG changes suggest seizure.   Hyperventilation and photic stimulation were not performed.      ABNORMALITY - Excessive beta, generalized    IMPRESSION: This study is within normal limits. The excessive beta activity seen in the background is most likely due to the effect of benzodiazepine and is a benign EEG pattern. No seizures or epileptiform discharges were seen throughout the recording.   Patient was noted to have multiple episodes of head shaking ( yes-yes movements) and whole body shaking throughout the study without concomitant EEG change. These episodes were NOT epileptic.    Heather Noble 

## 2021-05-04 NOTE — Progress Notes (Signed)
Pt de-saturated to 78%. RN entered room and pt appeared to be crying. Increased O2 to 4L on Echo and O2 came up to 94%. Pt able to respond when asked questions.

## 2021-05-05 ENCOUNTER — Inpatient Hospital Stay (HOSPITAL_COMMUNITY): Payer: Medicaid Other

## 2021-05-05 DIAGNOSIS — O1495 Unspecified pre-eclampsia, complicating the puerperium: Secondary | ICD-10-CM

## 2021-05-05 LAB — BPAM RBC
Blood Product Expiration Date: 202212232359
Blood Product Expiration Date: 202212232359
Blood Product Expiration Date: 202212232359
ISSUE DATE / TIME: 202211292055
ISSUE DATE / TIME: 202211300017
ISSUE DATE / TIME: 202211300430
Unit Type and Rh: 6200
Unit Type and Rh: 6200
Unit Type and Rh: 6200

## 2021-05-05 LAB — COMPREHENSIVE METABOLIC PANEL
ALT: 88 U/L — ABNORMAL HIGH (ref 0–44)
AST: 138 U/L — ABNORMAL HIGH (ref 15–41)
Albumin: 2.8 g/dL — ABNORMAL LOW (ref 3.5–5.0)
Alkaline Phosphatase: 132 U/L — ABNORMAL HIGH (ref 38–126)
Anion gap: 8 (ref 5–15)
BUN: 7 mg/dL (ref 6–20)
CO2: 25 mmol/L (ref 22–32)
Calcium: 8.1 mg/dL — ABNORMAL LOW (ref 8.9–10.3)
Chloride: 105 mmol/L (ref 98–111)
Creatinine, Ser: 0.79 mg/dL (ref 0.44–1.00)
GFR, Estimated: >60 mL/min (ref >60)
Glucose, Bld: 87 mg/dL (ref 70–99)
Potassium: 3.9 mmol/L (ref 3.5–5.1)
Sodium: 138 mmol/L (ref 135–145)
Total Bilirubin: 1.1 mg/dL (ref 0.3–1.2)
Total Protein: 7 g/dL (ref 6.5–8.1)

## 2021-05-05 LAB — CULTURE, OB URINE: Culture: NO GROWTH

## 2021-05-05 LAB — TYPE AND SCREEN
ABO/RH(D): A POS
Antibody Screen: NEGATIVE
Unit division: 0
Unit division: 0
Unit division: 0

## 2021-05-05 MED ORDER — ENALAPRIL MALEATE 5 MG PO TABS
5.0000 mg | ORAL_TABLET | Freq: Every day | ORAL | Status: DC
  Administered 2021-05-05 – 2021-05-06 (×2): 5 mg via ORAL
  Filled 2021-05-05 (×2): qty 1

## 2021-05-05 NOTE — Progress Notes (Signed)
CSW met with MOB in room 102 to complete an assessment for "High stress level at home."  When CSW arrived, MOB was bonding with infant as evidence by holding infant and engaging in infant massages. MOB had 2 other unknowns guest present that were observing MOB's and infant's interactions. CSW explained CSW's role and with MOB's permission, CSW asked MOB's guest to step outside the room in order to assess MOB in private.  MOB was soft spoken, polite, and receptive to meeting with CSW.   CSW assessed for household stressors and other psychosocial stressors; MOB denied them all.  MOB reported having little to no stress and reported having a great support team that has been willing to help.    CSW provided PMAD education and offered MOB resources for outpatient counseling and parenting education; MOB was accepting. A referral will be made to the [facility] with [place] for outpatient counseling and parenting education.   MOB reports having all essential items to care for infant and she shared feeling prepared for her future discharge.   CSW identifies no further need for intervention and no barriers to discharge at this time.    Laurey Arrow, MSW, LCSW Clinical Social Work [phone]

## 2021-05-05 NOTE — Consult Note (Signed)
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 female admitted medically 05/03/2021  3:33 PM for post partum seizure-like activity.Patient carries no dx psychiatric hx has a past medical history of  currently post-partum 6 days, P2,  .Psychiatry was consulted for concern for PNES.   On initial examination, patient appears to be in a significant amount of pain and has a concerning neurological exam. We plan to recommend that patient receive further workup to rule out other causes for abnormal presentation.    Patient appears physically improved today (still with pain, but not as severe) and labs and vitals appear overall stable. 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 Patient's brother also endorsed he has hx of pseudoseizures thus patient is likely at increased risk for this stress reaction. Mom did endorse that patient had what appears to be a hypnagogic AVH last night around 11 pm. The AVH was comforting to patient and patient fell asleep immediately after mom noted patient was having AVH of family/friend bringing her food. There have not been any AVH noted outside of going to or waking up from sleep. Patient denied having intrusive thoughts and denied specifically having intrusive thoughts of self harm. It was discussed with patient that she appears to be more anxious after this pregnancy and this is related to patient being anxious about her physical health. Patient endorsed that she felt a bit more uncomfortable going home after this delivery due to some concerns about her physical health and had noticed that she was dizzy at home and was attempting to precautionary measures as she did not want to  being holding her child  while she felt dizzy (notably patient was severely anemic when she presented for this hospitalization). Patient endorsed caring for her child and attempting risk reduction on her own with good judgment endorsing interest in therapy OP. Family has also endorsed that they will continue to support patient and ensure that she gets at least 4 hrs stretch of sleep at night to mitigate her anxiety and decrease potential for worsening psychiatric health.  Recommend - SW consult for OP therapy - Recommended CBT Post-partum anxiety work book   Thank you for this consult. We will sign off.  Total Time spent with patient: 30 minutes  Subjective:   Heather Noble is a 25 y.o. female patient admitted with seizure-like activity post partum.    HPI:  Patient appears physically improved from yesterday endorsing decreased pain in lumbar and decreased pressure sensation in her chest. Patient does have her 35 day old with her today. Patient's brother is also in the room and mom joined later. Patient endorses that she slept ok overnight and denies SI and HI. Patient endorses that her mother told her she had AVH prior to falling asleep last night. Patient reports that she was "some =what ready to go and somewhat not" when she was initially discharged after the birth of her son. Patient reports that she was more anxious this discharge than with her fist child because she had more pain. Patient reports that she was reassured, but remained reasonably anxious. Patient denied having intrusive thoughts of harm or thoughts that disturbed her. Patient endorsed that she felt supported by her family. 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 Also discussed that patient should get at least 4 hrs of unbroken sleep and that family will have to  assist with night time feeding in order to help patient get this. Discussed that patient does not appear to have post partum psychosis; however if she was noticed to have true hallucinations (not hypnopompic or hypnagogic rxn) and/or began having intrusive thoughts and/or bizarre behaviors this is a medical emergency and patient should be brought to the ED. Mom endorsed that thus far she only noted patient having AVH last night around 11am immediatly prior to falling asleep.   Of note patient's brother disclosed he has a hx of pseudoseizures as well.     Past Medical History:  Past Medical History:  Diagnosis Date  . Headache   . Pregnancy induced hypertension     Past Surgical History:  Procedure Laterality Date  . CESAREAN SECTION N/A 04/21/2017   Procedure: CESAREAN SECTION;  Surgeon: Osborne Oman, MD;  Location: [facility];  Service: Obstetrics;  Laterality: N/A;  . WISDOM TOOTH EXTRACTION  2015   Family History:  Family History  Problem Relation Age of Onset  . Hypertension Maternal Grandmother   . Osteoarthritis Maternal Grandmother   . Gout Maternal Grandmother   . Thyroid disease Maternal Grandmother   . Diabetes Maternal Grandmother   . Cancer Paternal Grandfather        unknown    Social History:  Social History   Substance and Sexual Activity  Alcohol Use No   Comment: occ     Social History   Substance and Sexual Activity  Drug Use No    Social History   Socioeconomic History  . Marital status: Single    Spouse name: Not on file  . Number of children: Not on file  . Years of education: Not on file  . Highest education level: Not on file  Occupational History  . Not on file  Tobacco Use  . Smoking status: Never  . Smokeless tobacco: Never  Vaping Use  . Vaping Use: Never used  Substance and Sexual Activity  . Alcohol use: No    Comment: occ  . Drug use: No  . Sexual activity: Yes    Partners: Male    Birth control/protection: None   Other Topics Concern  . Not on file  Social History Narrative  . Not on file   Social Determinants of Health   Financial Resource Strain: Not on file  Food Insecurity: Not on file  Transportation Needs: Not on file  Physical Activity: Not on file  Stress: Not on file  Social Connections: Not on file   Additional Social History:    Allergies:  No Known Allergies  Labs:  Results for orders placed or performed during the hospital encounter of 05/03/21 (from the past 48 hour(s))  Comprehensive metabolic panel     Status: Abnormal   Collection Time: 05/03/21  3:41 PM  Result Value Ref Range   Sodium 139 135 - 145 mmol/L   Potassium 4.1 3.5 - 5.1 mmol/L   Chloride 108 98 - 111 mmol/L   CO2 26 22 - 32 mmol/L   Glucose, Bld 83 70 - 99 mg/dL    Comment: Glucose reference range applies only to samples taken after fasting for at least 8 hours.   BUN 11 6 - 20 mg/dL   Creatinine,  Ser 0.83 0.44 - 1.00 mg/dL   Calcium 8.6 (L) 8.9 - 10.3 mg/dL   Total Protein 6.0 (L) 6.5 - 8.1 g/dL   Albumin 2.5 (L) 3.5 - 5.0 g/dL   AST 63 (H) 15 - 41 U/L   ALT 37 0 - 44 U/L   Alkaline Phosphatase 145 (H) 38 - 126 U/L   Total Bilirubin 0.5 0.3 - 1.2 mg/dL   GFR, Estimated >60 >60 mL/min    Comment: (NOTE) Calculated using the CKD-EPI Creatinine Equation (2021)    Anion gap 5 5 - 15    Comment: Performed at [facility], [place] [address] [place], [place] [address]  Ethanol     Status: None   Collection Time: 05/03/21  3:41 PM  Result Value Ref Range   Alcohol, Ethyl (B) <10 <10 mg/dL    Comment: (NOTE) Lowest detectable limit for serum alcohol is 10 mg/dL.  For medical purposes only. Performed at [facility], [place] [address], [place], [place] [address]   CBC with Differential     Status: Abnormal   Collection Time: 05/03/21  3:41 PM  Result Value Ref Range   WBC 8.7 4.0 - 10.5 K/uL   RBC 1.87 (L) 3.87 - 5.11 MIL/uL   Hemoglobin 5.6 (LL) 12.0 - 15.0 g/dL    Comment: REPEATED  TO VERIFY THIS CRITICAL RESULT HAS VERIFIED AND BEEN CALLED TO M GILLEY,RN BY WALTER BOND ON 11 29 2022 AT [id number], AND HAS BEEN READ BACK.     HCT 18.2 (L) 36.0 - 46.0 %   MCV 97.3 80.0 - 100.0 fL   MCH 29.9 26.0 - 34.0 pg   MCHC 30.8 30.0 - 36.0 g/dL   RDW 13.3 11.5 - 15.5 %   Platelets 384 150 - 400 K/uL   nRBC 0.3 (H) 0.0 - 0.2 %   Neutrophils Relative % 57 %   Neutro Abs 5.0 1.7 - 7.7 K/uL   Lymphocytes Relative 30 %   Lymphs Abs 2.6 0.7 - 4.0 K/uL   Monocytes Relative 10 %   Monocytes Absolute 0.8 0.1 - 1.0 K/uL   Eosinophils Relative 1 %   Eosinophils Absolute 0.1 0.0 - 0.5 K/uL   Basophils Relative 0 %   Basophils Absolute 0.0 0.0 - 0.1 K/uL   Immature Granulocytes 2 %   Abs Immature Granulocytes 0.17 (H) 0.00 - 0.07 K/uL    Comment: Performed at [facility] [address] [place], [place] [address]  Prepare RBC (crossmatch)     Status: None   Collection Time: 05/03/21  4:54 PM  Result Value Ref Range   Order Confirmation      ORDER PROCESSED BY BLOOD BANK Performed at [facility], [place] [address] [place], [place] [address]   Type and screen [place]     Status: None (Preliminary result)   Collection Time: 05/03/21  5:03 PM  Result Value Ref Range   ABO/RH(D) A POS    Antibody Screen NEG    Sample Expiration 05/06/2021,2359    Unit Number [id number]    Blood Component Type RED CELLS,LR    Unit division 00    Status of Unit ISSUED,FINAL    Transfusion Status OK TO TRANSFUSE    Crossmatch Result      Compatible Performed at [facility], [place] [address], [place],  [address]    Unit Number [id number]    Blood Component Type RED CELLS,LR  Unit division 00    Status of Unit ISSUED    Transfusion Status OK TO TRANSFUSE    Crossmatch Result Compatible    Unit Number [id number]    Blood Component Type RED CELLS,LR    Unit division 00    Status of Unit ISSUED    Transfusion Status OK TO TRANSFUSE     Crossmatch Result Compatible   Resp Panel by RT-PCR (Flu A&B, Covid) Nasopharyngeal Swab     Status: None   Collection Time: 05/03/21  5:21 PM   Specimen: Nasopharyngeal Swab; Nasopharyngeal(NP) swabs in vial transport medium  Result Value Ref Range   SARS Coronavirus 2 by RT PCR NEGATIVE NEGATIVE    Comment: (NOTE) SARS-CoV-2 target nucleic acids are NOT DETECTED.  The SARS-CoV-2 RNA is generally detectable in upper respiratory specimens during the acute phase of infection. The lowest concentration of SARS-CoV-2 viral copies this assay can detect is 138 copies/mL. A negative result does not preclude SARS-Cov-2 infection and should not be used as the sole basis for treatment or other patient management decisions. A negative result may occur with  improper specimen collection/handling, submission of specimen other than nasopharyngeal swab, presence of viral mutation(s) within the areas targeted by this assay, and inadequate number of viral copies(<138 copies/mL). A negative result must be combined with clinical observations, patient history, and epidemiological information. The expected result is Negative.  Fact Sheet for Patients:  BloggerCourse.com  Fact Sheet for Healthcare Providers:  SeriousBroker.it  This test is no t yet approved or cleared by the [place] FDA and  has been authorized for detection and/or diagnosis of SARS-CoV-2 by FDA under an Emergency Use Authorization (EUA). This EUA will remain  in effect (meaning this test can be used) for the duration of the COVID-19 declaration under Section 564(b)(1) of the Act, 21 U.S.C.section 360bbb-3(b)(1), unless the authorization is terminated  or revoked sooner.       Influenza A by PCR NEGATIVE NEGATIVE   Influenza B by PCR NEGATIVE NEGATIVE    Comment: (NOTE) The Xpert Xpress SARS-CoV-2/FLU/RSV plus assay is intended as an aid in the diagnosis of influenza from  Nasopharyngeal swab specimens and should not be used as a sole basis for treatment. Nasal washings and aspirates are unacceptable for Xpert Xpress SARS-CoV-2/FLU/RSV testing.  Fact Sheet for Patients: BloggerCourse.com  Fact Sheet for Healthcare Providers: SeriousBroker.it  This test is not yet approved or cleared by the [place] FDA and has been authorized for detection and/or diagnosis of SARS-CoV-2 by FDA under an Emergency Use Authorization (EUA). This EUA will remain in effect (meaning this test can be used) for the duration of the COVID-19 declaration under Section 564(b)(1) of the Act, 21 U.S.C. section 360bbb-3(b)(1), unless the authorization is terminated or revoked.  Performed at [facility], [address] [place], [place] [address]   Prepare RBC (crossmatch)     Status: None   Collection Time: 05/03/21  5:36 PM  Result Value Ref Range   Order Confirmation      ORDER PROCESSED BY BLOOD BANK Performed at [facility], [address], [place], [place] [address]   Comprehensive metabolic panel     Status: Abnormal   Collection Time: 05/04/21  6:26 AM  Result Value Ref Range   Sodium 140 135 - 145 mmol/L   Potassium 4.1 3.5 - 5.1 mmol/L   Chloride 106 98 - 111 mmol/L   CO2 25 22 - 32 mmol/L   Glucose, Bld 90  70 - 99 mg/dL    Comment: Glucose reference range applies only to samples taken after fasting for at least 8 hours.   BUN 5 (L) 6 - 20 mg/dL   Creatinine, Ser 0.70 0.44 - 1.00 mg/dL   Calcium 8.1 (L) 8.9 - 10.3 mg/dL   Total Protein 6.7 6.5 - 8.1 g/dL   Albumin 2.7 (L) 3.5 - 5.0 g/dL   AST 137 (H) 15 - 41 U/L   ALT 69 (H) 0 - 44 U/L   Alkaline Phosphatase 131 (H) 38 - 126 U/L   Total Bilirubin 1.1 0.3 - 1.2 mg/dL   GFR, Estimated >60 >60 mL/min    Comment: (NOTE) Calculated using the CKD-EPI Creatinine Equation (2021)    Anion gap 9 5 - 15    Comment: Performed at [facility], [place] [address], [place], [place] [address]  Magnesium     Status: Abnormal   Collection Time: 05/04/21  6:26 AM  Result Value Ref Range   Magnesium 5.5 (H) 1.7 - 2.4 mg/dL    Comment: Performed at [facility] [address] [place], [place] [address]  Urinalysis, Routine w reflex microscopic Urine, Catheterized     Status: None   Collection Time: 05/04/21  9:38 AM  Result Value Ref Range   Color, Urine YELLOW YELLOW   APPearance CLEAR CLEAR   Specific Gravity, Urine 1.025 1.005 - 1.030   pH 7.5 5.0 - 8.0   Glucose, UA NEGATIVE NEGATIVE mg/dL   Hgb urine dipstick NEGATIVE NEGATIVE   Bilirubin Urine NEGATIVE NEGATIVE   Ketones, ur NEGATIVE NEGATIVE mg/dL   Protein, ur NEGATIVE NEGATIVE mg/dL   Nitrite NEGATIVE NEGATIVE   Leukocytes,Ua NEGATIVE NEGATIVE    Comment: Microscopic not done on urines with negative protein, blood, leukocytes, nitrite, or glucose < 500 mg/dL. Performed at [facility], [place] [address] [place], [place] [address]   CBC with Differential/Platelet     Status: Abnormal   Collection Time: 05/04/21  9:46 AM  Result Value Ref Range   WBC 11.3 (H) 4.0 - 10.5 K/uL   RBC 3.61 (L) 3.87 - 5.11 MIL/uL   Hemoglobin 10.7 (L) 12.0 - 15.0 g/dL    Comment: REPEATED TO VERIFY POST TRANSFUSION SPECIMEN    HCT 31.6 (L) 36.0 - 46.0 %   MCV 87.5 80.0 - 100.0 fL    Comment: POST TRANSFUSION SPECIMEN REPEATED TO VERIFY    MCH 29.6 26.0 - 34.0 pg   MCHC 33.9 30.0 - 36.0 g/dL   RDW 17.5 (H) 11.5 - 15.5 %   Platelets 426 (H) 150 - 400 K/uL   nRBC 0.4 (H) 0.0 - 0.2 %   Neutrophils Relative % 72 %   Neutro Abs 8.1 (H) 1.7 - 7.7 K/uL   Lymphocytes Relative 15 %   Lymphs Abs 1.7 0.7 - 4.0 K/uL   Monocytes Relative 10 %   Monocytes Absolute 1.1 (H) 0.1 - 1.0 K/uL   Eosinophils Relative 0 %   Eosinophils Absolute 0.0 0.0 - 0.5 K/uL   Basophils Relative 0 %   Basophils Absolute 0.0 0.0 - 0.1 K/uL   Immature Granulocytes 3 %   Abs Immature Granulocytes 0.29 (H)  0.00 - 0.07 K/uL    Comment: Performed at [facility] [address] [place], [place] [address]  Comprehensive metabolic panel     Status: Abnormal   Collection Time: 05/05/21  5:22 AM  Result Value Ref Range  Sodium 138 135 - 145 mmol/L   Potassium 3.9 3.5 - 5.1 mmol/L   Chloride 105 98 - 111 mmol/L   CO2 25 22 - 32 mmol/L   Glucose, Bld 87 70 - 99 mg/dL    Comment: Glucose reference range applies only to samples taken after fasting for at least 8 hours.   BUN 7 6 - 20 mg/dL   Creatinine, Ser 0.79 0.44 - 1.00 mg/dL   Calcium 8.1 (L) 8.9 - 10.3 mg/dL   Total Protein 7.0 6.5 - 8.1 g/dL   Albumin 2.8 (L) 3.5 - 5.0 g/dL   AST 138 (H) 15 - 41 U/L   ALT 88 (H) 0 - 44 U/L   Alkaline Phosphatase 132 (H) 38 - 126 U/L   Total Bilirubin 1.1 0.3 - 1.2 mg/dL   GFR, Estimated >60 >60 mL/min    Comment: (NOTE) Calculated using the CKD-EPI Creatinine Equation (2021)    Anion gap 8 5 - 15    Comment: Performed at [facility], [place] [address], [place], [place] [address]    Current Facility-Administered Medications  Medication Dose Route Frequency Provider Last Rate Last Admin  . 0.9 %  sodium chloride infusion (Manually program via Guardrails IV Fluids)   Intravenous Once Constant, Peggy, MD 10 mL/hr at 05/03/21 2300 Rate Verify at 05/03/21 2300  . acetaminophen (TYLENOL) tablet 650 mg  650 mg Oral Q4H PRN Constant, Peggy, MD   650 mg at 05/04/21 1808  . benzocaine-Menthol (DERMOPLAST) 20-0.5 % topical spray 1 application  1 application Topical QID PRN Woodroe Mode, MD   1 application at [id number]  . calcium carbonate (TUMS - dosed in mg elemental calcium) chewable tablet 400 mg of elemental calcium  2 tablet Oral Q4H PRN Constant, Peggy, MD      . docusate sodium (COLACE) capsule 100 mg  100 mg Oral Daily Constant, Peggy, MD   100 mg at 05/05/21 0938  . labetalol (NORMODYNE) injection 20 mg  20 mg Intravenous PRN Constant, Peggy, MD       And  . labetalol (NORMODYNE)  injection 40 mg  40 mg Intravenous PRN Constant, Peggy, MD       And  . labetalol (NORMODYNE) injection 80 mg  80 mg Intravenous PRN Constant, Peggy, MD       And  . hydrALAZINE (APRESOLINE) injection 10 mg  10 mg Intravenous PRN Constant, Peggy, MD      . HYDROmorphone (DILAUDID) injection 0.5 mg  0.5 mg Intravenous Q3H PRN Griffin Basil, MD   0.5 mg at 05/05/21 0759  . MEDLINE mouth rinse  15 mL Mouth Rinse BID Constant, Peggy, MD   15 mL at 05/05/21 0942  . NIFEdipine (PROCARDIA-XL/NIFEDICAL-XL) 24 hr tablet 30 mg  30 mg Oral Daily Constant, Peggy, MD   30 mg at 05/05/21 0938  . oxyCODONE-acetaminophen (PERCOCET/ROXICET) 5-325 MG per tablet 1-2 tablet  1-2 tablet Oral Q6H PRN Constant, Peggy, MD      . prenatal multivitamin tablet 1 tablet  1 tablet Oral Q1200 Constant, Peggy, MD   1 tablet at 05/05/21 [id number]     Psychiatric Specialty Exam:  Presentation  General Appearance: Appropriate for Environment (8 day old resting on her abdomen, slight rythmic jerking movements in the lower lip)  Eye Contact:Good  Speech:Clear and Coherent  Speech Volume:Normal  Handedness:No data recorded  Mood and Affect  Mood:Euthymic ("better")  Affect:Appropriate   Thought Process  Thought Processes:Coherent  Descriptions of Associations:Intact  Orientation:Full (Time, Place and Person)  Thought Content:Logical  History of Schizophrenia/Schizoaffective disorder:No data recorded Duration of Psychotic Symptoms:No data recorded Hallucinations:Hallucinations: None (per mom patient had AVH prior to falling asleep last night around 11pm)  Ideas of Reference:None  Suicidal Thoughts:Suicidal Thoughts: No  Homicidal Thoughts:Homicidal Thoughts: No   Sensorium  Memory:Immediate Good; Recent Good; Remote Good  Judgment:Fair  Insight:Fair   Executive Functions  Concentration:Good  Attention Span:Good  Recall:No data recorded Fund of  Knowledge:Good  Language:Good   Psychomotor Activity  Psychomotor Activity:Psychomotor Activity: -- (rhytmic movements at the lip and shoulders)   Assets  Assets:Communication Skills; Housing; Resilience; Social Support   Sleep  Sleep:Sleep: Fair   Physical Exam: Physical Exam HENT:     Head: Normocephalic and atraumatic.  Pulmonary:     Effort: Pulmonary effort is normal.  Neurological:     Mental Status: She is alert and oriented to person, place, and time.   Review of Systems  Psychiatric/Behavioral:  Negative for depression and suicidal ideas.   Blood pressure 131/85, pulse 73, temperature 98.2 F (36.8 C), temperature source Oral, resp. rate 17, SpO2 100 %, unknown if currently breastfeeding. There is no height or weight on file to calculate BMI.  PGY-2  Freida Busman, MD 05/05/2021 10:59 AM

## 2021-05-05 NOTE — Progress Notes (Signed)
Gynecology Progress Note  Admission Date: 05/03/2021 Current Date: 05/05/2021 9:20 AM  Heather Noble is a 25 y.o. [id number] HD#2  PPD8 admitted for postpartum preeclampsia and pseudoseizures   History complicated by: Patient Active Problem List   Diagnosis Date Noted   Preeclampsia in postpartum period 05/03/2021   Severe pre-eclampsia 05/02/2021   Gestational hypertension 04/26/2021   GBS (group B Streptococcus carrier), +RV culture, currently pregnant 04/16/2021   Encounter for supervision of normal pregnancy, antepartum 09/29/2020   History of gestational hypertension 04/21/2017   Sickle cell trait (HCC) 11/03/2016   Rubella non-immune status, antepartum 11/03/2016   Anemia in pregnancy 11/03/2016   S/P cesarean section 10/16/2016   Migraine with aura and without status migrainosus, not intractable 10/16/2014    ROS and patient/family/surgical history, located on admission H&P note dated 05/03/2021, have been reviewed, and there are no changes except as noted below Yesterday/Overnight Events:  none  Subjective:  Pt seen.  Much more awake than previous.  She is oriented and able to converse today.  She still complains of low back pain and weakness/pain in her left leg.  She is eating and drinking as well as she is able to take oral medication.  Pt denies visual changes and RUQ pain.She notes very mild headache.  Objective:   Vitals:   05/05/21 0700 05/05/21 0814 05/05/21 0815 05/05/21 0817  BP:    131/85  Pulse:    73  Resp:      Temp:      TempSrc:      SpO2: 100% 100% 100%     Temp:  [97.9 F (36.6 C)-98.4 F (36.9 C)] 98.4 F (36.9 C) (12/01 0326) Pulse Rate:  [73-94] 73 (12/01 0817) Resp:  [15-18] 18 (12/01 0326) BP: (126-141)/(84-96) 131/85 (12/01 0817) SpO2:  [97 %-100 %] 100 % (12/01 0815) I/O last 3 completed shifts: In: 3014.9 [P.O.:480; I.V.:1401.9; Blood:1033; IV Piggyback:100] Out: 7525 [Urine:7525] Total I/O In: -  Out: 300  [Urine:300]  Intake/Output Summary (Last 24 hours) at 05/05/2021 0920 Last data filed at 05/05/2021 0814 Gross per 24 hour  Intake 1104.72 ml  Output 3175 ml  Net -2070.28 ml     Current Vital Signs 24h Vital Sign Ranges  T 98.4 F (36.9 C) Temp  Avg: 98.2 F (36.8 C)  Min: 97.9 F (36.6 C)  Max: 98.4 F (36.9 C)  BP 131/85 BP  Min: 126/85  Max: 141/96  HR 73 Pulse  Avg: 81.8  Min: 73  Max: 94  RR 18 Resp  Avg: 16.4  Min: 15  Max: 18  SaO2 100 %  (Room Air) SpO2  Avg: 99.7 %  Min: 97 %  Max: 100 %       24 Hour I/O Current Shift I/O  Time Ins Outs 11/30 0701 - 12/01 0700 In: 1620.7 [P.O.:480; I.V.:804.7] Out: 3325 [Urine:3325] 12/01 0701 - 12/01 1900 In: -  Out: 300 [Urine:300]   Patient Vitals for the past 12 hrs:  BP Temp Temp src Pulse Resp SpO2  05/05/21 0817 131/85 -- -- 73 -- --  05/05/21 0815 -- -- -- -- -- 100 %  05/05/21 0814 -- -- -- -- -- 100 %  05/05/21 0700 -- -- -- -- -- 100 %  05/05/21 0600 -- -- -- -- -- 99 %  05/05/21 0500 -- -- -- -- -- 99 %  05/05/21 0400 -- -- -- -- -- 99 %  05/05/21 0326 136/84 98.4 F (36.9 C) Oral 73 18 99 %  05/05/21 0300 -- -- -- -- -- 99 %  05/05/21 0200 -- -- -- -- -- 98 %  05/05/21 0100 -- -- -- -- -- 99 %  05/05/21 0000 -- -- -- -- -- 98 %  05/04/21 2353 132/84 -- -- 73 18 99 %  05/04/21 2300 -- -- -- -- -- 98 %     Patient Vitals for the past 24 hrs:  BP Temp Temp src Pulse Resp SpO2  05/05/21 0817 131/85 -- -- 73 -- --  05/05/21 0815 -- -- -- -- -- 100 %  05/05/21 0814 -- -- -- -- -- 100 %  05/05/21 0700 -- -- -- -- -- 100 %  05/05/21 0600 -- -- -- -- -- 99 %  05/05/21 0500 -- -- -- -- -- 99 %  05/05/21 0400 -- -- -- -- -- 99 %  05/05/21 0326 136/84 98.4 F (36.9 C) Oral 73 18 99 %  05/05/21 0300 -- -- -- -- -- 99 %  05/05/21 0200 -- -- -- -- -- 98 %  05/05/21 0100 -- -- -- -- -- 99 %  05/05/21 0000 -- -- -- -- -- 98 %  05/04/21 2353 132/84 -- -- 73 18 99 %  05/04/21 2300 -- -- -- -- -- 98 %  05/04/21  2100 -- -- -- -- -- 98 %  05/04/21 2000 -- -- -- -- -- 99 %  05/04/21 1949 (!) 141/96 98.1 F (36.7 C) Oral 87 18 98 %  05/04/21 1700 -- -- -- -- 16 100 %  05/04/21 1610 -- -- -- -- -- 99 %  05/04/21 1605 (!) 130/92 97.9 F (36.6 C) Oral 91 17 99 %  05/04/21 1555 -- -- -- -- -- 97 %  05/04/21 1550 -- -- -- -- -- 100 %  05/04/21 1545 -- -- -- -- -- 99 %  05/04/21 1540 -- -- -- -- -- 100 %  05/04/21 1535 -- -- -- -- -- 99 %  05/04/21 1530 -- -- -- -- -- 100 %  05/04/21 1525 -- -- -- -- -- 100 %  05/04/21 1520 -- -- -- -- -- 100 %  05/04/21 1515 -- -- -- -- -- 100 %  05/04/21 1510 -- -- -- -- -- 100 %  05/04/21 1505 -- -- -- -- -- 100 %  05/04/21 1500 -- -- -- -- 15 100 %  05/04/21 1455 -- -- -- -- -- 100 %  05/04/21 1450 -- -- -- -- -- 100 %  05/04/21 1445 -- -- -- -- -- 100 %  05/04/21 1440 -- -- -- -- -- 100 %  05/04/21 1435 -- -- -- -- -- 100 %  05/04/21 1430 -- -- -- -- -- 100 %  05/04/21 1425 -- -- -- -- -- 100 %  05/04/21 1420 -- -- -- -- -- 100 %  05/04/21 1415 -- -- -- -- -- 100 %  05/04/21 1400 -- -- -- -- 16 --  05/04/21 1300 -- -- -- -- 16 --  05/04/21 1255 -- -- -- -- -- 100 %  05/04/21 1250 -- -- -- -- -- 100 %  05/04/21 1245 -- -- -- -- -- 100 %  05/04/21 1240 -- -- -- -- -- 100 %  05/04/21 1235 -- -- -- -- -- 100 %  05/04/21 1230 -- -- -- -- -- 100 %  05/04/21 1225 -- -- -- -- -- 100 %  05/04/21 1220 -- -- -- -- -- 100 %  05/04/21 1215 -- -- -- -- --  100 %  05/04/21 1210 -- -- -- -- -- 100 %  05/04/21 1206 126/85 98.2 F (36.8 C) Axillary 94 15 100 %  05/04/21 1200 -- -- -- -- -- 100 %  05/04/21 1155 -- -- -- -- -- 100 %  05/04/21 1150 -- -- -- -- -- 100 %  05/04/21 1145 -- -- -- -- -- 99 %  05/04/21 1140 -- -- -- -- -- 100 %  05/04/21 1135 -- -- -- -- -- 100 %  05/04/21 1125 -- -- -- -- -- 100 %  05/04/21 1120 -- -- -- -- -- 100 %  05/04/21 1115 -- -- -- -- -- 100 %  05/04/21 1110 -- -- -- -- -- 100 %  05/04/21 1105 -- -- -- -- -- 100 %  05/04/21  1100 -- -- -- -- 16 100 %  05/04/21 1055 -- -- -- -- -- 100 %  05/04/21 1050 -- -- -- -- -- 100 %  05/04/21 1045 -- -- -- -- -- 100 %  05/04/21 1040 -- -- -- -- -- 100 %  05/04/21 1035 -- -- -- -- -- 100 %  05/04/21 1030 -- -- -- -- -- 100 %  05/04/21 1025 -- -- -- -- -- 100 %  05/04/21 1020 -- -- -- -- -- 100 %  05/04/21 1019 -- -- -- -- -- 100 %  05/04/21 1015 -- -- -- -- -- 100 %  05/04/21 1010 -- -- -- -- -- 100 %  05/04/21 1005 -- -- -- -- -- 100 %  05/04/21 1000 -- -- -- -- 15 100 %  05/04/21 0955 -- -- -- -- -- 100 %  05/04/21 0950 -- -- -- -- -- 100 %  05/04/21 0945 -- -- -- -- -- 100 %  05/04/21 0940 -- -- -- -- -- 99 %  05/04/21 0935 -- -- -- -- -- 100 %  05/04/21 0925 -- -- -- -- -- 100 %    Physical exam: General appearance: alert, cooperative, appears stated age, and no distress Abdomen: soft, non-tender; bowel sounds normal; no masses,  no organomegaly GU: No gross VB Lungs: clear to auscultation bilaterally Heart: regular rate and rhythm Extremities: no lower extremity edema, no calf pain with palpation Skin: WNL Psych: appropriate Neurologic: still with uncontrolled subtle facial movements, bilateral lower extremity weakness, good grip strength  Medications Current Facility-Administered Medications  Medication Dose Route Frequency Provider Last Rate Last Admin   0.9 %  sodium chloride infusion (Manually program via Guardrails IV Fluids)   Intravenous Once Constant, Peggy, MD 10 mL/hr at 05/03/21 2300 Rate Verify at 05/03/21 2300   acetaminophen (TYLENOL) tablet 650 mg  650 mg Oral Q4H PRN Constant, Peggy, MD   650 mg at 05/04/21 1808   benzocaine-Menthol (DERMOPLAST) 20-0.5 % topical spray 1 application  1 application Topical QID PRN Adam Phenix, MD   1 application at 05/04/21 1730   calcium carbonate (TUMS - dosed in mg elemental calcium) chewable tablet 400 mg of elemental calcium  2 tablet Oral Q4H PRN Constant, Peggy, MD       docusate sodium (COLACE)  capsule 100 mg  100 mg Oral Daily Constant, Peggy, MD       labetalol (NORMODYNE) injection 20 mg  20 mg Intravenous PRN Constant, Peggy, MD       And   labetalol (NORMODYNE) injection 40 mg  40 mg Intravenous PRN Constant, Peggy, MD       And   labetalol (NORMODYNE)  injection 80 mg  80 mg Intravenous PRN Constant, Peggy, MD       And   hydrALAZINE (APRESOLINE) injection 10 mg  10 mg Intravenous PRN Constant, Peggy, MD       HYDROmorphone (DILAUDID) injection 0.5 mg  0.5 mg Intravenous Q3H PRN Warden Fillers, MD   0.5 mg at 05/05/21 0759   MEDLINE mouth rinse  15 mL Mouth Rinse BID Constant, Peggy, MD   15 mL at 05/04/21 2211   NIFEdipine (PROCARDIA-XL/NIFEDICAL-XL) 24 hr tablet 30 mg  30 mg Oral Daily Constant, Peggy, MD   30 mg at 05/04/21 1700   oxyCODONE-acetaminophen (PERCOCET/ROXICET) 5-325 MG per tablet 1-2 tablet  1-2 tablet Oral Q6H PRN Constant, Peggy, MD       prenatal multivitamin tablet 1 tablet  1 tablet Oral Q1200 Constant, Peggy, MD          Labs  Recent Labs  Lab 05/03/21 1541 05/04/21 0946  WBC 8.7 11.3*  HGB 5.6* 10.7*  HCT 18.2* 31.6*  PLT 384 426*    Recent Labs  Lab 05/03/21 1541 05/04/21 0626 05/05/21 0522  NA 139 140 138  K 4.1 4.1 3.9  CL 108 106 105  CO2 26 25 25   BUN 11 5* 7  CREATININE 0.83 0.70 0.79  CALCIUM 8.6* 8.1* 8.1*  PROT 6.0* 6.7 7.0  BILITOT 0.5 1.1 1.1  ALKPHOS 145* 131* 132*  ALT 37 69* 88*  AST 63* 137* 138*  GLUCOSE 83 90 87    Radiology CLINICAL DATA:  Mental status change   EXAM: CT HEAD WITHOUT CONTRAST   TECHNIQUE: Contiguous axial images were obtained from the base of the skull through the vertex without intravenous contrast.   COMPARISON:  CT head 11/04/2020   FINDINGS: Brain: No evidence of acute infarction, hemorrhage, hydrocephalus, extra-axial collection or mass lesion/mass effect.   Vascular: Negative for hyperdense vessel   Skull: Negative   Sinuses/Orbits: Mild mucosal edema paranasal sinuses.  Negative orbit   Other: None   IMPRESSION: Negative CT head   Mild sinus mucosal edema.   Assessment & Plan:  Postpartum preeclampsia:  BP improved on procardia, will monitor to see if there needs to be a dose modification LFT actually more elevated, no RUQ pain, will trend values Kidney function is appropriate   Neuro function/pseudoseizures: Appreciate neurology assistance Will get PT for eval and treat due to weakness Pysch to reconsult since patient is now awake   If patient stable, plan for d/c on 05/06/21 Code Status: Full Code  Total time taking care of the patient was 20 minutes, with greater than 50% of the time spent in face to face interaction with the patient.  14/2/22, MD  [facility] [employer])

## 2021-05-05 NOTE — Discharge Instructions (Signed)
Cognitive Behavioral Therapy Book discussed with Psychiatry The Pregnancy and Postpartum Anxiety Workbook: Practical Skills to Help You Overcome Anxiety, Worry, Panic Attacks, Obsessions, and Compulsions  By:  Evlyn Kanner. Wiegartz  Environmental education officer), Anna Genre. Gyoerkoe  Environmental education officer), Overton Mam (Foreword)

## 2021-05-05 NOTE — Progress Notes (Signed)
Neurology Progress Note  Subjective: Patient's abnormal movements are much improved today with minimal head "yes" nodding during assessment without abnormal eye movements. Patient also has improvement in mental status and communicates with examiner.  Patient complains of lower back pain that shoots up her spine with associated pain with leg movement and pain from her lower back with neck movement.   Exam: Vitals:   05/05/21 0600 05/05/21 0700  BP:    Pulse:    Resp:    Temp:    SpO2: 99% 100%   Gen: Laying in hospital bed with some reports of discomfort from her lower back pain Resp: non-labored breathing, no respiratory distress Abd: soft, non-distended  Neuro: Mental Status: Awake, alert to self, age, month, year, and location. She states that she is unable to recall events leading up to hospitalization. She follows commands without difficulty.  Speech is hypophonic but without dysarthria or aphasia.  No neglect is noted. Patient has improved but minimal head nodding motion during examination that is intermittent and resolves with distraction Cranial Nerves: PERRL, EOMI without ptosis, visual fields are full, there is decreased sensation to light touch on the left face compared to the right, face is symmetric resting and with movement, hearing is intact to voice, shoulders shrug symmetrically, tongue protrudes midline.  Motor: Examination is somewhat effort limited but she elevates bilateral upper extremities with antigravity movement and with coaching is able to sustain antigravity positioning. Bilateral lower extremities briefly elevate antigravity with significant complaints of back pain with lower extremity movement. Patient actively resists passive lower extremity elevation.  Sensory: Patient reports decreased sensation to the left face, arm, and leg to light touch.  DTR: 2+ and symmetric throughout Plantars: Toes downgoing bilaterally Gait: Deferred  Pertinent Labs: CBC     Component Value Date/Time   WBC 11.3 (H) 05/04/2021 0946   RBC 3.61 (L) 05/04/2021 0946   HGB 10.7 (L) 05/04/2021 0946   HGB 9.2 (L) 04/18/2021 1641   HCT 31.6 (L) 05/04/2021 0946   HCT 28.5 (L) 04/18/2021 1641   PLT 426 (H) 05/04/2021 0946   PLT 287 04/18/2021 1641   MCV 87.5 05/04/2021 0946   MCV 91 04/18/2021 1641   MCH 29.6 05/04/2021 0946   MCHC 33.9 05/04/2021 0946   RDW 17.5 (H) 05/04/2021 0946   RDW 12.9 04/18/2021 1641   LYMPHSABS 1.7 05/04/2021 0946   LYMPHSABS 2.2 10/06/2020 1059   MONOABS 1.1 (H) 05/04/2021 0946   EOSABS 0.0 05/04/2021 0946   EOSABS 0.0 10/06/2020 1059   BASOSABS 0.0 05/04/2021 0946   BASOSABS 0.0 10/06/2020 1059   CMP     Component Value Date/Time   NA 138 05/05/2021 0522   NA 142 03/15/2021 1646   K 3.9 05/05/2021 0522   CL 105 05/05/2021 0522   CO2 25 05/05/2021 0522   GLUCOSE 87 05/05/2021 0522   BUN 7 05/05/2021 0522   BUN 5 (L) 03/15/2021 1646   CREATININE 0.79 05/05/2021 0522   CALCIUM 8.1 (L) 05/05/2021 0522   PROT 7.0 05/05/2021 0522   PROT 7.1 03/15/2021 1646   ALBUMIN 2.8 (L) 05/05/2021 0522   ALBUMIN 3.4 (L) 03/15/2021 1646   AST 138 (H) 05/05/2021 0522   ALT 88 (H) 05/05/2021 0522   ALKPHOS 132 (H) 05/05/2021 0522   BILITOT 1.1 05/05/2021 0522   BILITOT 0.5 03/15/2021 1646   GFRNONAA >60 05/05/2021 0522   GFRAA >60 02/25/2019 0940   Imaging Reviewed:  EEG 11/30:  "This study is within normal  limits. The excessive beta activity seen in the background is most likely due to the effect of benzodiazepine and is a benign EEG pattern. No seizures or epileptiform discharges were seen throughout the recording.  Patient was noted to have multiple episodes of head shaking ( yes-yes movements) and whole body shaking throughout the study without concomitant EEG change. These episodes were NOT epileptic."  [facility] 11/30: Negative CT head Mild sinus mucosal edema.  Assessment: 25 year old post partum black female, presenting with  continuous seizure-like activity 1. On exam, she continues to exhibit movements that appear most consistent with a functional etiology with overall improvement in symptoms today per family at bedside. She no longer has pursing movements of lips on exam. No meningismus on exam but complains of lower back pain with neck movement and lower extremity movement. 2. Electrophysiology:  -EEG 11/29: This study is within normal limits. The excessive beta activity seen in the background is most likely due to the effect of benzodiazepine and is a benign EEG pattern. No seizures or epileptiform discharges were seen throughout the recording. Patient was noted to have multiple episodes of head shaking throughout the study without concomitant EEG change. These episodes were NOT epileptic.   -LTM EEG report for 05/03/2021 1710 to 05/04/2021 1015:  This study is within normal limits. The excessive beta activity seen in the background is most likely due to the effect of benzodiazepine and is a benign EEG pattern. No seizures or epileptiform discharges were seen throughout the recording. Patient was noted to have multiple episodes of head shaking (yes-yes movements) and whole body shaking throughout the study without concomitant EEG change. These episodes were NOT epileptic.  3. Patient has been under significant stress lately per mother.  4. 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 In the absence of trauma, there is an unclear etiology for her lower back pain. Agree with MRI lumbar spine to rule out organic cause of lower back pain.  5. HTN.  6. No fever or meningismus to suggest a meningitis.   Recommendations: - Agree with MRI lumbar spine for evaluation of lower back pain - Reassuring examination today, no indication for MRI brain at this time with significant improvement in mental status and ongoing concerns for quality of imaging with abnormal head movements.  - Continue management of  metabolic abnormalities per primary team   Lanae Boast, AGACNP-BC [facility] [phone]  Electronically signed: Dr. Caryl Pina

## 2021-05-05 NOTE — Plan of Care (Signed)

## 2021-05-05 NOTE — Progress Notes (Signed)
Late entry note. Chaplain notified by pt RN that pt was found unresponsive with seizures and family was appropriately overwhelmed and worried. Chaplain introduced spiritual care and offered support and reflective listening. Chaplain assisted medical team with transport to CT scan (pushing O2 cart and supporting patient who cried out in pain when bed went over bumps.) Chaplain empowered pt's partner and uncle by assisting hem to find ways to help Heather Noble find physical comfort and offered space to process the fear they're experiencing.   Marland Kitchenama

## 2021-05-05 NOTE — Progress Notes (Signed)
Chaplain follow up visit with pt. Chaplain audibly exclaimed upon seeing pt's dramatic change from yesterday. Pt celebrated how much better she feels. Chaplain asked open ended questions to facilitate story telling and emotional expression as pt begins to make sense and meaning of the last week of her life, discerning what she remembers. Chaplain named the duality of joy in recovery and grief in losing that time with her son and the trauma she endured and encouraged pt to continue to practice self care as she continues to heal.  Please page as further needs arise.  Heather Noble. Carley Hammed, M.Div. [facility] Chaplain Pager [phone] Office [phone]

## 2021-05-06 ENCOUNTER — Other Ambulatory Visit (HOSPITAL_COMMUNITY): Payer: Self-pay

## 2021-05-06 ENCOUNTER — Ambulatory Visit: Payer: Medicaid Other

## 2021-05-06 MED ORDER — ENALAPRIL MALEATE 10 MG PO TABS
10.0000 mg | ORAL_TABLET | Freq: Every day | ORAL | 1 refills | Status: DC
  Filled 2021-05-06: qty 30, 30d supply, fill #0

## 2021-05-06 MED ORDER — ENALAPRIL MALEATE 5 MG PO TABS: 10.0000 mg | ORAL_TABLET | Freq: Every day | ORAL | Status: DC

## 2021-05-06 NOTE — Progress Notes (Signed)
Discharge instructions and prescriptions given to pt. Discussed signs and symptoms to report to the MD, upcoming appointments, and meds. Pt verbalizes understanding and has no questions or concerns at this time. Pt discharged home from hospital in stable condition. 

## 2021-05-06 NOTE — Discharge Summary (Signed)
Physician Discharge Summary  Patient ID: Heather Noble MRN: [id number] DOB/AGE: [birth year] 25 y.o.  Admit date: 05/03/2021 Discharge date: 05/06/2021  Admission Diagnoses: postpartum preeclampsia, pseudoseizures  Discharge Diagnoses:  Principal Problem:   Preeclampsia in postpartum period Pseudoseizures  Discharged Condition: good  Hospital Course: Pt was admitted with severe preeclampsia and initial presentation of eclampsia PPD 5 from [facility].  Further evaluation showed no seizure activity as verified by neurology.  Pt did receive 24 hours of magnesium sulfate and was started on IV antihypertensives.  Initially, pt was very lethargic but would follow commands.  Once more awake, the patient complained of persistent headache and low back pain.  Of note pt did not have epidural with her delivery.  CT scan of head and lumbar MRI were both negative.  Pt was also seen by psychiatry and social work once she was more lucid.  Recommendations were for in house counseling which is being set up.   Pt was also seen by physical therapy which had no recommendations.  Pt was started on po procardia and enalapril; however, due to headache the patient refused further doses of procardia.  Consults: neurology, psychiatry, and social work  Significant Diagnostic Studies: radiology: MRI: lumbar spine and CT scan: of head  Treatments: magnesium sulfate  Discharge Exam: Blood pressure (!) 134/92, pulse 83, temperature 98.9 F (37.2 C), temperature source Oral, resp. rate 18, SpO2 98 %, unknown if currently breastfeeding. General appearance: alert, cooperative, and no distress Head: Normocephalic, without obvious abnormality, atraumatic Resp: clear to auscultation bilaterally Cardio: regular rate and rhythm GI: soft, non-tender; bowel sounds normal; no masses,  no organomegaly Extremities: extremities normal, atraumatic, no cyanosis or edema and Homans sign is negative, no sign of DVT Neurologic: Mental  status: Alert, oriented, thought content appropriate, alertness: alert, orientation: time, date, person, place, affect: normal  Disposition: Discharge disposition: 01-Home or Self Care       Discharge Instructions     Activity as tolerated   Complete by: As directed    Call MD for:  difficulty breathing, headache or visual disturbances   Complete by: As directed    Call MD for:  persistant dizziness or light-headedness   Complete by: As directed    Call MD for:  persistant nausea and vomiting   Complete by: As directed    Call MD for:  severe uncontrolled pain   Complete by: As directed    Call MD for:  temperature >100.4   Complete by: As directed    Diet - low sodium heart healthy   Complete by: As directed    Driving restriction    Complete by: As directed    Avoid driving for at least 1 weeks.      Allergies as of 05/06/2021   No Known Allergies      Medication List     STOP taking these medications    Blood Pressure Kit Devi   famotidine 20 MG tablet Commonly known as: PEPCID   ibuprofen 800 MG tablet Commonly known as: ADVIL   NIFEdipine 30 MG 24 hr tablet Commonly known as: PROCARDIA-XL/NIFEDICAL-XL   ondansetron 4 MG disintegrating tablet Commonly known as: Zofran ODT   oxyCODONE 5 MG immediate release tablet Commonly known as: Oxy IR/ROXICODONE       TAKE these medications    [facility] Prenatal Gummy 0.4-25 MG Chew Chew 3 tablets by mouth daily.   enalapril 10 MG tablet Commonly known as: VASOTEC Take 1 tablet (10 mg total) by mouth daily.  Start taking on: May 07, 2021        Follow-up [place] Follow up on 05/11/2021.   Specialty: Obstetrics and Gynecology Why: BP check for postpartum preeclampsia, also needs CMP drawn Contact information: [address] [place] [phone]               At BP check will will inquire regarding sleep  disturbances and hallucinations. Signed: Griffin Basil 05/06/2021, 11:33 AM

## 2021-05-06 NOTE — Evaluation (Signed)
Physical Therapy Evaluation Patient Details Name: Heather Noble MRN: [id number] DOB: 02-13-1996 Today's Date: 05/06/2021  History of Present Illness  Pt adm 11/29 after seizure like activity at home. Pt post-partum with delivery on 11/24 with preeclampsia complications. 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 Pt also with c/o's of back pain and lumbar MRI negative.  Clinical Impression  Pt presents to PT with decr in mobility due to back pain and recent pseudoseizures. Pt with slow movements but is able to mobilize adequately. Pt with improvements over the last couple of days and expect pt will continue to make spontaneous improvement as is usually seen with pseudoseizures. Feel pt can return home. Mother would like pt to practice stairs since she lives on 3rd story apartment. Will return shortly with step to practice.        Recommendations for follow up therapy are one component of a multi-disciplinary discharge planning process, led by the attending physician.  Recommendations may be updated based on patient status, additional functional criteria and insurance authorization.  Follow Up Recommendations No PT follow up    Assistance Recommended at Discharge PRN  Functional Status Assessment Patient has had a recent decline in their functional status and demonstrates the ability to make significant improvements in function in a reasonable and predictable amount of time.  Equipment Recommendations  None recommended by PT    Recommendations for Other Services       Precautions / Restrictions Precautions Precautions: None      Mobility  Bed Mobility Overal bed mobility: Modified Independent             General bed mobility comments: Incr time. Instructed on movements to minimize back pain    Transfers Overall transfer level: Modified independent Equipment used: None                    Ambulation/Gait Ambulation/Gait assistance:  Supervision Gait Distance (Feet): 250 Feet Assistive device: None Gait Pattern/deviations: Step-through pattern;Decreased stride length Gait velocity: decr Gait velocity interpretation: <1.31 ft/sec, indicative of household ambulator   General Gait Details: Pt with slow, deliberate gait without instability or loss of balance.  Stairs            Wheelchair Mobility    Modified Rankin (Stroke Patients Only)       Balance Overall balance assessment: No apparent balance deficits (not formally assessed)                                           Pertinent Vitals/Pain Pain Assessment: Faces Faces Pain Scale: Hurts little more Pain Location: head and back Pain Descriptors / Indicators: Aching Pain Intervention(s): Monitored during session    Home Living Family/patient expects to be discharged to:: Private residence Living Arrangements: Spouse/significant other Available Help at Discharge: Family;Available 24 hours/day Type of Home: Apartment Home Access: Stairs to enter Entrance Stairs-Rails: Right Entrance Stairs-Number of Steps: 3 flights   Home Layout: One level Home Equipment: None      Prior Function Prior Level of Function : Independent/Modified Independent                     Hand Dominance        Extremity/Trunk Assessment   Upper Extremity Assessment Upper Extremity Assessment: Overall WFL for tasks assessed    Lower Extremity Assessment Lower Extremity Assessment: Overall [facility]  for tasks assessed       Communication   Communication: No difficulties  Cognition Arousal/Alertness: Awake/alert Behavior During Therapy: Flat affect Overall Cognitive Status: Within Functional Limits for tasks assessed                                          General Comments      Exercises     Assessment/Plan    PT Assessment Patient does not need any further PT services  PT Problem List         PT Treatment  Interventions      PT Goals (Current goals can be found in the Care Plan section)  Acute Rehab PT Goals Patient Stated Goal: return home    Frequency     Barriers to discharge        Co-evaluation               AM-PAC PT "6 Clicks" Mobility  Outcome Measure Help needed turning from your back to your side while in a flat bed without using bedrails?: None Help needed moving from lying on your back to sitting on the side of a flat bed without using bedrails?: None Help needed moving to and from a bed to a chair (including a wheelchair)?: None Help needed standing up from a chair using your arms (e.g., wheelchair or bedside chair)?: None Help needed to walk in hospital room?: None Help needed climbing 3-5 steps with a railing? : A Little 6 Click Score: 23    End of Session   Activity Tolerance: Patient tolerated treatment well Patient left: in chair;with call bell/phone within reach;with family/visitor present Nurse Communication: Mobility status PT Visit Diagnosis: Pain Pain - part of body:  (back and head)    Time: 7681-1572 PT Time Calculation (min) (ACUTE ONLY): 27 min   Charges:   PT Evaluation $PT Eval Moderate Complexity: 1 Mod          [facility] PT Acute Rehabilitation Services Pager [phone] Office [phone]   Angelina Ok [facility] 05/06/2021, 10:36 AM

## 2021-05-06 NOTE — Progress Notes (Signed)
Physical Therapy Treatment Patient Details Name: Heather Noble MRN: [id number] DOB: 1996-05-03 Today's Date: 05/06/2021   History of Present Illness Pt adm 11/29 after seizure like activity at home. Pt post-partum with delivery on 11/24 with preeclampsia complications. 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 Pt also with c/o's of back pain and lumbar MRI negative.    PT Comments    Returned to practice stairs. Pt able to go up/down step x 10. Feel she can perform the stairs necessary to return to her apartment. Pt dc'd from PT.    Recommendations for follow up therapy are one component of a multi-disciplinary discharge planning process, led by the attending physician.  Recommendations may be updated based on patient status, additional functional criteria and insurance authorization.  Follow Up Recommendations  No PT follow up     Assistance Recommended at Discharge PRN  Equipment Recommendations  None recommended by PT    Recommendations for Other Services       Precautions / Restrictions Precautions Precautions: None     Mobility  Bed Mobility Overal bed mobility: Modified Independent             General bed mobility comments: Pt up in chair    Transfers Overall transfer level: Modified independent Equipment used: None                    Ambulation/Gait Ambulation/Gait assistance: Supervision Gait Distance (Feet): 250 Feet Assistive device: None Gait Pattern/deviations: Step-through pattern;Decreased stride length Gait velocity: decr Gait velocity interpretation: <1.31 ft/sec, indicative of household ambulator   General Gait Details: Pt with slow, deliberate gait without instability or loss of balance.   Stairs Stairs: Yes Stairs assistance: Supervision Stair Management: One rail Left;Forwards;Backwards Number of Stairs: 10 (up/down 1 step x 10 using portable step) General stair comments: Pt with steady up forwards and  down backwards on portable step.   Wheelchair Mobility    Modified Rankin (Stroke Patients Only)       Balance Overall balance assessment: No apparent balance deficits (not formally assessed)                                          Cognition Arousal/Alertness: Awake/alert Behavior During Therapy: Flat affect Overall Cognitive Status: Within Functional Limits for tasks assessed                                          Exercises      General Comments        Pertinent Vitals/Pain Pain Assessment: Faces Faces Pain Scale: Hurts little more Pain Location: head and back Pain Descriptors / Indicators: Aching Pain Intervention(s): Monitored during session    Home Living Family/patient expects to be discharged to:: Private residence Living Arrangements: Spouse/significant other Available Help at Discharge: Family;Available 24 hours/day Type of Home: Apartment Home Access: Stairs to enter Entrance Stairs-Rails: Right Entrance Stairs-Number of Steps: 3 flights   Home Layout: One level Home Equipment: None      Prior Function            PT Goals (current goals can now be found in the care plan section) Acute Rehab PT Goals Patient Stated Goal: return home Progress towards PT goals: Goals met/education completed, patient discharged from PT  Frequency           PT Plan      Co-evaluation              AM-PAC PT "6 Clicks" Mobility   Outcome Measure  Help needed turning from your back to your side while in a flat bed without using bedrails?: None Help needed moving from lying on your back to sitting on the side of a flat bed without using bedrails?: None Help needed moving to and from a bed to a chair (including a wheelchair)?: None Help needed standing up from a chair using your arms (e.g., wheelchair or bedside chair)?: None Help needed to walk in hospital room?: None Help needed climbing 3-5 steps with a  railing? : A Little 6 Click Score: 23    End of Session   Activity Tolerance: Patient tolerated treatment well Patient left: in chair;with call bell/phone within reach;with family/visitor present Nurse Communication: Mobility status PT Visit Diagnosis: Pain Pain - part of body:  (back and head)     Time: 3329-5188 PT Time Calculation (min) (ACUTE ONLY): 10 min  Charges:  $Gait Training: 8-22 mins                     [place] Pager [phone] Office [place] 05/06/2021, 10:43 AM

## 2021-05-06 NOTE — Progress Notes (Signed)
MRI lumbar spine with mild lumbar degenerative change. Negative for lumbar stenosis or neural impingement.  Agree with Psychiatry team's most recent recommendations.   Neurology will sign off. Please call if there are additional questions.   Electronically signed: Dr. Caryl Pina

## 2021-05-11 ENCOUNTER — Ambulatory Visit (INDEPENDENT_AMBULATORY_CARE_PROVIDER_SITE_OTHER): Payer: Medicaid Other

## 2021-05-11 ENCOUNTER — Telehealth (HOSPITAL_COMMUNITY): Payer: Self-pay | Admitting: *Deleted

## 2021-05-11 ENCOUNTER — Other Ambulatory Visit: Payer: Self-pay

## 2021-05-11 VITALS — BP 138/89 | HR 90

## 2021-05-11 DIAGNOSIS — O1495 Unspecified pre-eclampsia, complicating the puerperium: Secondary | ICD-10-CM

## 2021-05-11 NOTE — Progress Notes (Addendum)
Subjective:  Heather Noble is a 25 y.o. female here for BP check.   Hypertension ROS: taking medications as instructed, no medication side effects noted, no TIA's, no chest pain on exertion, no dyspnea on exertion, and no swelling of ankles.    Objective:  BP 145/98  BP 138/89   Pulse 90   Breastfeeding Yes   Appearance alert, well appearing, and in no distress. General exam BP noted to be well controlled today in office.    Assessment:   Blood Pressure stable.   Plan:  Reviewed with Dr. Clearance Coots. Dr. Debroah Loop delayed in procedure. Current treatment plan is effective, no change in therapy.  CMP today - lab tech notified me pt was unable to tolerate blood draw due to soreness and requests to repeat during pp visit.   Patient was assessed and managed by nursing staff during this encounter. I have reviewed the chart and agree with the documentation and plan. I have also made any necessary editorial changes.  Coral Ceo, MD 05/12/2021 1:12 PM

## 2021-05-11 NOTE — Telephone Encounter (Signed)
Patient was readmitted to hospital for seizure-like activity. Had follow-up appointment with MD today. Reported taking blood pressure medications as prescribed. Has home visit scheduled with [facility] nurse on 12/12, and another follow-up appointment with MD on 12/23.Patient voiced no other questions or concerns at this time. EPDS=1. Patient voiced no questions or concerns regarding infant at this time. Patient reports infant sleeps in a bassinet on his back. RN reviewed ABCs of safe sleep. Patient verbalized understanding. Patient requested RN email information on hospital's virtual postpartum classes and support groups. Email sent. Deforest Hoyles, RN, 05/11/21, [phone]

## 2021-05-27 ENCOUNTER — Ambulatory Visit (INDEPENDENT_AMBULATORY_CARE_PROVIDER_SITE_OTHER): Payer: Medicaid Other | Admitting: Obstetrics & Gynecology

## 2021-05-27 ENCOUNTER — Other Ambulatory Visit: Payer: Self-pay

## 2021-05-27 VITALS — BP 137/89 | HR 90 | Ht 65.0 in | Wt 204.5 lb

## 2021-05-27 DIAGNOSIS — O1495 Unspecified pre-eclampsia, complicating the puerperium: Secondary | ICD-10-CM | POA: Diagnosis not present

## 2021-05-27 DIAGNOSIS — Z30011 Encounter for initial prescription of contraceptive pills: Secondary | ICD-10-CM

## 2021-05-27 MED ORDER — NORGESTIMATE-ETH ESTRADIOL 0.25-35 MG-MCG PO TABS: 1.0000 | ORAL_TABLET | Freq: Every day | ORAL | 11 refills | Status: DC

## 2021-05-27 MED ORDER — ENALAPRIL MALEATE 10 MG PO TABS: 10.0000 mg | ORAL_TABLET | Freq: Every day | ORAL | 1 refills | Status: DC

## 2021-05-27 NOTE — Progress Notes (Signed)
Post Partum Visit Note  Heather Noble is a 25 y.o. G2P2002 female who presents for a postpartum visit. She is 4 weeks postpartum following a normal spontaneous vaginal delivery.  I have fully reviewed the prenatal and intrapartum course. The delivery was at 38.5 gestational weeks.  Anesthesia: local. Postpartum course has been complicated by postpartum pre E. Baby is doing well: yes. Baby is feeding by bottle - Similac Total 360 . Bleeding no bleeding. Bowel function is normal. Bladder function is normal. Patient is not sexually active. Contraception method is none. Postpartum depression screening: negative.  She had postpartum hemorrhage and eclampsia, now on Vasotec. She request OCP The pregnancy intention screening data noted above was reviewed. Potential methods of contraception were discussed. The patient elected to proceed with No data recorded.   Edinburgh Postnatal Depression Scale - 05/27/21 0926       Edinburgh Postnatal Depression Scale:  In the Past 7 Days   I have been able to laugh and see the funny side of things. 0    I have looked forward with enjoyment to things. 0    I have blamed myself unnecessarily when things went wrong. 0    I have been anxious or worried for no good reason. 0    I have felt scared or panicky for no good reason. 0    Things have been getting on top of me. 0    I have been so unhappy that I have had difficulty sleeping. 0    I have felt sad or miserable. 0    I have been so unhappy that I have been crying. 0    The thought of harming myself has occurred to me. 0    Edinburgh Postnatal Depression Scale Total 0             Health Maintenance Due  Topic Date Due   COVID-19 Vaccine (1) Never done   Pneumococcal Vaccine 45-4 Years old (1 - PCV) Never done   HPV VACCINES (1 - 2-dose series) Never done   TETANUS/TDAP  Never done   INFLUENZA VACCINE  Never done    The following portions of the patient's history were reviewed and updated  as appropriate: allergies, current medications, past family history, past medical history, past social history, past surgical history, and problem list.  Review of Systems Pertinent items are noted in HPI.  Objective:  BP 137/89    Pulse 90    Ht 5\' 5"  (1.651 m)    Wt 204 lb 8 oz (92.8 kg)    Breastfeeding No    BMI 34.03 kg/m    General:  alert, cooperative, and no distress   Breasts:  normal  Lungs:   Heart:    Abdomen: soft, non-tender; bowel sounds normal; no masses,  no organomegaly   Wound   GU exam:  not indicated       Assessment:   Preeclampsia in postpartum period - Plan: enalapril (VASOTEC) 10 MG tablet  Postpartum care and examination  Oral contraception initial prescription - Plan: norgestimate-ethinyl estradiol (ORTHO-CYCLEN) 0.25-35 MG-MCG tablet   normal postpartum exam.   Plan:   Essential components of care per ACOG recommendations:  1.  Mood and well being: Patient with negative depression screening today. Reviewed local resources for support.  - Patient tobacco use? No.   - hx of drug use? No.    2. Infant care and feeding:  -Patient currently breastmilk feeding? No.  -Social determinants of health (SDOH)  reviewed in EPIC. No concerns   3. Sexuality, contraception and birth spacing - Patient does not want a pregnancy in the next year.  Desired family size is 2 children.  - Reviewed forms of contraception in tiered fashion. Patient desired oral contraceptives (estrogen/progesterone) today.   - Discussed birth spacing of 18 months  4. Sleep and fatigue -Encouraged family/partner/community support of 4 hrs of uninterrupted sleep to help with mood and fatigue  5. Physical Recovery  - Discussed patients delivery and complications. She describes her labor as good. - Patient had a Vaginal, no problems at delivery. Patient had a 2nd degree laceration. Perineal healing reviewed. Patient expressed understanding - Patient has urinary incontinence? No. -  Patient is safe to resume physical and sexual activity  6.  Health Maintenance - HM due items addressed Yes - Last pap smear  Diagnosis  Date Value Ref Range Status  10/06/2020   Final   - Negative for intraepithelial lesion or malignancy (NILM)   Pap smear not done at today's visit.  -Breast Cancer screening indicated? No.   7. Chronic Disease/Pregnancy Condition follow up: Hypertension  - PCP follow up  Scheryl Darter, MD [facility] for [facility], [facility]

## 2021-06-05 DIAGNOSIS — R569 Unspecified convulsions: Secondary | ICD-10-CM | POA: Insufficient documentation

## 2021-06-29 ENCOUNTER — Other Ambulatory Visit: Payer: Self-pay | Admitting: Obstetrics & Gynecology

## 2021-06-29 DIAGNOSIS — O1495 Unspecified pre-eclampsia, complicating the puerperium: Secondary | ICD-10-CM

## 2021-12-27 ENCOUNTER — Other Ambulatory Visit: Payer: Self-pay | Admitting: Internal Medicine

## 2022-01-03 LAB — HEMOGLOBINOPATHY EVALUATION
Fetal Hemoglobin Testing: 2.2 % — ABNORMAL HIGH (ref 0.0–1.9)
HCT: 35.7 % (ref 35.0–45.0)
Hemoglobin A2 - HGBRFX: 3 % (ref 2.2–3.2)
Hemoglobin: 10.8 g/dL — ABNORMAL LOW (ref 11.7–15.5)
Hgb A: 55.9 % — ABNORMAL LOW (ref >96.0)
Hgb S Quant: 38.9 % — ABNORMAL HIGH
MCH: 28.9 pg (ref 27.0–33.0)
MCV: 95.5 fL (ref 80.0–100.0)
RBC: 3.74 10*6/uL — ABNORMAL LOW (ref 3.80–5.10)
RDW: 12 % (ref 11.0–15.0)

## 2022-01-03 LAB — CBC
HCT: 33.4 % — ABNORMAL LOW (ref 35.0–45.0)
Hemoglobin: 10.9 g/dL — ABNORMAL LOW (ref 11.7–15.5)
MCH: 29.3 pg (ref 27.0–33.0)
MCHC: 32.6 g/dL (ref 32.0–36.0)
MCV: 89.8 fL (ref 80.0–100.0)
MPV: 9.6 fL (ref 7.5–12.5)
Platelets: 457 10*3/uL — ABNORMAL HIGH (ref 140–400)
RBC: 3.72 10*6/uL — ABNORMAL LOW (ref 3.80–5.10)
RDW: 11.8 % (ref 11.0–15.0)
WBC: 6.1 10*3/uL (ref 3.8–10.8)

## 2022-01-03 LAB — VITAMIN B12: Vitamin B-12: 401 pg/mL (ref 200–1100)

## 2022-01-03 LAB — IRON, TOTAL/TOTAL IRON BINDING CAP
%SAT: 18 % (calc) (ref 16–45)
Iron: 69 ug/dL (ref 40–190)
TIBC: 387 mcg/dL (calc) (ref 250–450)

## 2022-01-03 LAB — SICKLE CELL SCREEN: Sickle Solubility Test - HGBRFX: POSITIVE — AB

## 2022-01-03 LAB — FERRITIN: Ferritin: 10 ng/mL — ABNORMAL LOW (ref 16–154)

## 2022-01-03 LAB — FOLATE: Folate: 9.6 ng/mL

## 2022-05-13 ENCOUNTER — Encounter (HOSPITAL_COMMUNITY): Payer: Self-pay | Admitting: *Deleted

## 2022-05-13 ENCOUNTER — Ambulatory Visit (HOSPITAL_COMMUNITY)
Admission: EM | Admit: 2022-05-13 | Discharge: 2022-05-13 | Disposition: A | Discharge status: Home / Self Care | Payer: Medicaid Other

## 2022-05-13 DIAGNOSIS — S025XXA Fracture of tooth (traumatic), initial encounter for closed fracture: Secondary | ICD-10-CM

## 2022-05-13 DIAGNOSIS — K0889 Other specified disorders of teeth and supporting structures: Secondary | ICD-10-CM | POA: Diagnosis not present

## 2022-05-13 MED ORDER — NAPROXEN SODIUM 550 MG PO TABS: 550.0000 mg | ORAL_TABLET | Freq: Two times a day (BID) | ORAL | 0 refills | Status: DC

## 2022-05-13 MED ORDER — PENICILLIN V POTASSIUM 500 MG PO TABS: 500.0000 mg | ORAL_TABLET | Freq: Three times a day (TID) | ORAL | 0 refills | Status: DC

## 2022-05-13 NOTE — Discharge Instructions (Signed)
Advised to take the antibiotics that he is every 12 hours regularly to help reduce pain. Advised take the Pen-Vee K 500 mg every 8 hours to help prevent and treat infection. Advised to go back to normal appointment if symptoms possible to help treat dental pain.  Problems addressed. Advised follow-up PCP or return to urgent care if symptoms fail to improve.

## 2022-05-13 NOTE — ED Triage Notes (Signed)
Pt c/o starting with left lower tooth pain onset last night. Has been taking Tyl and IBU, and applying herbal toothache oint without any relief.

## 2022-05-13 NOTE — ED Provider Notes (Signed)
[facility]    CSN: [id number] Arrival date & time: 05/13/22  1730      History   Chief Complaint Chief Complaint  Patient presents with   Dental Pain    HPI Heather Noble is a 26 y.o. female.   63-year-old female presents with tooth pain.  Patient indicates last night her feeling broke all 2 of her teeth 1 on the lower left lower quadrant.  Patient indicates that she has been having increasing discomfort and pain of both teeth that is not relieved with Tylenol or ibuprofen.  Patient indicates she has sensitivity with hot and cold.  She indicates that she is not able to chew due to the pain and discomfort from the broken teeth.  Patient indicates she did call her dentist to arrange an appointment however that particular office did not take her insurance.  She indicates she will call the dentist on Monday to arrange an appointment.  He denies having any fever or chills.   Dental Pain   Past Medical History:  Diagnosis Date   Headache    Pregnancy induced hypertension     Patient Active Problem List   Diagnosis Date Noted   Preeclampsia in postpartum period 05/03/2021   History of gestational hypertension 04/21/2017   Sickle cell trait (HCC) 11/03/2016   Migraine with aura and without status migrainosus, not intractable 10/16/2014    Past Surgical History:  Procedure Laterality Date   CESAREAN SECTION N/A 04/21/2017   Procedure: CESAREAN SECTION;  Surgeon: Tereso Newcomer, MD;  Location: [facility];  Service: Obstetrics;  Laterality: N/A;   WISDOM TOOTH EXTRACTION  2015    OB History     Gravida  2   Para  2   Term  2   Preterm  0   AB  0   Living  2      SAB  0   IAB  0   Ectopic  0   Multiple  0   Live Births  2            Home Medications    Prior to Admission medications   Medication Sig Start Date End Date Taking? Authorizing Provider  AMLODIPINE BESYLATE PO Take by mouth.   Yes [provider]   naproxen sodium (ANAPROX DS) 550 MG tablet Take 1 tablet (550 mg total) by mouth 2 (two) times daily with a meal. For pain relief. 05/13/22  Yes Ellsworth Lennox, PA-C  penicillin v potassium (VEETID) 500 MG tablet Take 1 tablet (500 mg total) by mouth 3 (three) times daily. 05/13/22  Yes Ellsworth Lennox, PA-C  enalapril (VASOTEC) 10 MG tablet Take 1 tablet (10 mg total) by mouth daily. 05/27/21   Adam Phenix, MD  norgestimate-ethinyl estradiol (ORTHO-CYCLEN) 0.25-35 MG-MCG tablet Take 1 tablet by mouth daily. 05/27/21   Adam Phenix, MD  Prenatal MV & Min w/FA-DHA ([facility] PRENATAL GUMMY) 0.4-25 MG CHEW Chew 3 tablets by mouth daily. 09/08/20   Hermina Staggers, MD    Family History Family History  Problem Relation Age of Onset   Hypertension Maternal Grandmother    Osteoarthritis Maternal Grandmother    Gout Maternal Grandmother    Thyroid disease Maternal Grandmother    Diabetes Maternal Grandmother    Cancer Paternal Grandfather        unknown    Social History Social History   Tobacco Use   Smoking status: Never   Smokeless tobacco: Never  Vaping Use  Vaping Use: Never used  Substance Use Topics   Alcohol use: No    Comment: occ   Drug use: No     Allergies   Patient has no known allergies.   Review of Systems Review of Systems  HENT:  Positive for dental problem (broken teeth lower on both sides).      Physical Exam Triage Vital Signs ED Triage Vitals  Enc Vitals Group     BP 05/13/22 1824 (!) 142/96     Pulse Rate 05/13/22 1824 83     Resp 05/13/22 1824 18     Temp 05/13/22 1824 98.2 F (36.8 C)     Temp Source 05/13/22 1824 Oral     SpO2 05/13/22 1824 98 %     Weight --      Height --      Head Circumference --      Peak Flow --      Pain Score 05/13/22 1823 8     Pain Loc --      Pain Edu? --      Excl. in GC? --    No data found.  Updated Vital Signs BP (!) 142/96 Comment: has not taken HTN med today  Pulse 83   Temp 98.2 F (36.8 C) (Oral)    Resp 18   LMP 05/02/2022 (Approximate)   SpO2 98%   Breastfeeding No   Visual Acuity Right Eye Distance:   Left Eye Distance:   Bilateral Distance:    Right Eye Near:   Left Eye Near:    Bilateral Near:     Physical Exam Constitutional:      Appearance: Normal appearance.  HENT:     Mouth/Throat:      Comments: Mouth: There are 2 broken fillings and teeth on the lower portion left is a molar in the right is in the incisor.  There is minimal swelling and no drainage present. Neurological:     Mental Status: She is alert.      UC Treatments / Results  Labs (all labs ordered are listed, but only abnormal results are displayed) Labs Reviewed - No data to display  EKG   Radiology No results found.  Procedures Procedures (including critical care time)  Medications Ordered in UC Medications - No data to display  Initial Impression / Assessment and Plan / UC Course  I have reviewed the triage vital signs and the nursing notes.  Pertinent labs & imaging results that were available during my care of the patient were reviewed by me and considered in my medical decision making (see chart for details).    Plan: 1.  The dental pain will be treated with the following: A.  Anaprox DS every 12 hours with food to help decrease pain. 2.  The fracture of the tooth will be treated with the following: A.  Pen-Vee K 500 mg every 8 hours to treat infection. 3.  Advised dental evaluation as soon as appointment can be arranged. 4.  Advised to follow-up with PCP or return to urgent care as needed. Final Clinical Impressions(s) / UC Diagnoses   Final diagnoses:  Pain, dental  Closed fracture of tooth, initial encounter     Discharge Instructions      Advised to take the antibiotics that he is every 12 hours regularly to help reduce pain. Advised take the Pen-Vee K 500 mg every 8 hours to help prevent and treat infection. Advised to go back to normal appointment if symptoms  possible to help treat dental pain.  Problems addressed. Advised follow-up PCP or return to urgent care if symptoms fail to improve.    ED Prescriptions     Medication Sig Dispense Auth. Provider   penicillin v potassium (VEETID) 500 MG tablet Take 1 tablet (500 mg total) by mouth 3 (three) times daily. 30 tablet Ellsworth Lennox, PA-C   naproxen sodium (ANAPROX DS) 550 MG tablet Take 1 tablet (550 mg total) by mouth 2 (two) times daily with a meal. For pain relief. 20 tablet Ellsworth Lennox, PA-C      PDMP not reviewed this encounter.   Ellsworth Lennox, PA-C 05/13/22 1840

## 2022-05-20 ENCOUNTER — Encounter (HOSPITAL_COMMUNITY): Payer: Self-pay

## 2022-05-20 ENCOUNTER — Ambulatory Visit (HOSPITAL_COMMUNITY)
Admission: EM | Admit: 2022-05-20 | Discharge: 2022-05-20 | Disposition: A | Discharge status: Home / Self Care | Payer: Medicaid Other | Attending: Emergency Medicine | Admitting: Emergency Medicine

## 2022-05-20 DIAGNOSIS — Z9889 Other specified postprocedural states: Secondary | ICD-10-CM | POA: Diagnosis not present

## 2022-05-20 DIAGNOSIS — K0889 Other specified disorders of teeth and supporting structures: Secondary | ICD-10-CM

## 2022-05-20 MED ORDER — ACETAMINOPHEN 325 MG PO TABS
ORAL_TABLET | ORAL | Status: AC
  Filled 2022-05-20: qty 1

## 2022-05-20 MED ORDER — TRAMADOL HCL 50 MG PO TABS: 50.0000 mg | ORAL_TABLET | Freq: Four times a day (QID) | ORAL | 0 refills | Status: AC | PRN

## 2022-05-20 MED ORDER — LIDOCAINE VISCOUS HCL 2 % MT SOLN
OROMUCOSAL | Status: AC
  Filled 2022-05-20: qty 15

## 2022-05-20 MED ORDER — LIDOCAINE VISCOUS HCL 2 % MT SOLN
15.0000 mL | Freq: Once | OROMUCOSAL | Status: AC
  Administered 2022-05-20: 15 mL via OROMUCOSAL

## 2022-05-20 MED ORDER — LIDOCAINE VISCOUS HCL 2 % MT SOLN: 15.0000 mL | OROMUCOSAL | 0 refills | Status: DC | PRN

## 2022-05-20 MED ORDER — ACETAMINOPHEN 325 MG PO TABS
650.0000 mg | ORAL_TABLET | Freq: Once | ORAL | Status: AC
  Administered 2022-05-20: 650 mg via ORAL

## 2022-05-20 NOTE — ED Triage Notes (Signed)
Pt states she had teeth pulled on both sides of her mouth 2 days ago and she is having pain. States she is taking 800mg  Motrin with no relief.

## 2022-05-20 NOTE — ED Provider Notes (Signed)
[facility]    CSN: [id number] Arrival date & time: 05/20/22  1224     History   Chief Complaint Chief Complaint  Patient presents with   Dental Pain    HPI Heather Noble is a 26 y.o. female.  Presents with dental pain Had 2 broken teeth pulled 3 days ago, reports 10/10 pain Taking 800 mg ibuprofen without relief. Lasts for about an hour and then pain comes back No fevers No trouble swallowing. Pain with chewing  Taking amox prescribed by dentist  Past Medical History:  Diagnosis Date   Headache    Pregnancy induced hypertension     Patient Active Problem List   Diagnosis Date Noted   Preeclampsia in postpartum period 05/03/2021   History of gestational hypertension 04/21/2017   Sickle cell trait (HCC) 11/03/2016   Migraine with aura and without status migrainosus, not intractable 10/16/2014    Past Surgical History:  Procedure Laterality Date   CESAREAN SECTION N/A 04/21/2017   Procedure: CESAREAN SECTION;  Surgeon: Tereso Newcomer, MD;  Location: [facility];  Service: Obstetrics;  Laterality: N/A;   WISDOM TOOTH EXTRACTION  2015    OB History     Gravida  2   Para  2   Term  2   Preterm  0   AB  0   Living  2      SAB  0   IAB  0   Ectopic  0   Multiple  0   Live Births  2            Home Medications    Prior to Admission medications   Medication Sig Start Date End Date Taking? Authorizing Provider  lidocaine (XYLOCAINE) 2 % solution Use as directed 15 mLs in the mouth or throat every hour as needed for mouth pain. Swish in the mouth and spit out 05/20/22  Yes Bemnet Trovato, Lurena Joiner, PA-C  traMADol (ULTRAM) 50 MG tablet Take 1 tablet (50 mg total) by mouth every 6 (six) hours as needed for up to 3 days. 05/20/22 05/23/22 Yes Tyrianna Lightle, PA-C  AMLODIPINE BESYLATE PO Take by mouth.    [provider]  enalapril (VASOTEC) 10 MG tablet Take 1 tablet (10 mg total) by mouth daily. 05/27/21   Adam Phenix, MD  naproxen sodium (ANAPROX DS) 550 MG tablet Take 1 tablet (550 mg total) by mouth 2 (two) times daily with a meal. For pain relief. 05/13/22   Ellsworth Lennox, PA-C  norgestimate-ethinyl estradiol (ORTHO-CYCLEN) 0.25-35 MG-MCG tablet Take 1 tablet by mouth daily. 05/27/21   Adam Phenix, MD  penicillin v potassium (VEETID) 500 MG tablet Take 1 tablet (500 mg total) by mouth 3 (three) times daily. 05/13/22   Ellsworth Lennox, PA-C  Prenatal MV & Min w/FA-DHA ([employer] PRENATAL GUMMY) 0.4-25 MG CHEW Chew 3 tablets by mouth daily. 09/08/20   Hermina Staggers, MD    Family History Family History  Problem Relation Age of Onset   Hypertension Maternal Grandmother    Osteoarthritis Maternal Grandmother    Gout Maternal Grandmother    Thyroid disease Maternal Grandmother    Diabetes Maternal Grandmother    Cancer Paternal Grandfather        unknown    Social History Social History   Tobacco Use   Smoking status: Never   Smokeless tobacco: Never  Vaping Use   Vaping Use: Never used  Substance Use Topics   Alcohol use: No  Comment: occ   Drug use: No     Allergies   Patient has no known allergies.   Review of Systems Review of Systems Per HPI  Physical Exam Triage Vital Signs ED Triage Vitals  Enc Vitals Group     BP 05/20/22 1441 137/87     Pulse Rate 05/20/22 1441 96     Resp 05/20/22 1441 16     Temp 05/20/22 1441 99 F (37.2 C)     Temp Source 05/20/22 1441 Oral     SpO2 05/20/22 1441 98 %     Weight --      Height --      Head Circumference --      Peak Flow --      Pain Score 05/20/22 1443 10     Pain Loc --      Pain Edu? --      Excl. in [place]? --    No data found.  Updated Vital Signs BP 137/87 (BP Location: Right Arm)   Pulse 96   Temp 99 F (37.2 C) (Oral)   Resp 16   LMP 05/02/2022 (Approximate)   SpO2 98%    Physical Exam Vitals and nursing note reviewed.  Constitutional:      General: She is in acute distress.     Comments: Appears very  uncomfortable, holding jaw  HENT:     Mouth/Throat:     Dentition: Dental tenderness present. No gingival swelling or dental abscesses.     Pharynx: Oropharynx is clear.      Comments: Two teeth removed, soft blood clot/scab covering each opening. Very tender around the area. No swelling, drainage, or sign of infection  Cardiovascular:     Rate and Rhythm: Normal rate and regular rhythm.     Pulses: Normal pulses.     Heart sounds: Normal heart sounds.  Pulmonary:     Effort: Pulmonary effort is normal.     Breath sounds: Normal breath sounds.  Neurological:     Mental Status: She is alert and oriented to person, place, and time.     UC Treatments / Results  Labs (all labs ordered are listed, but only abnormal results are displayed) Labs Reviewed - No data to display  EKG   Radiology No results found.  Procedures Procedures   Medications Ordered in UC Medications  acetaminophen (TYLENOL) tablet 650 mg (650 mg Oral Given 05/20/22 1513)  lidocaine (XYLOCAINE) 2 % viscous mouth solution 15 mL (15 mLs Mouth/Throat Given 05/20/22 1513)    Initial Impression / Assessment and Plan / UC Course  I have reviewed the triage vital signs and the nursing notes.  Pertinent labs & imaging results that were available during my care of the patient were reviewed by me and considered in my medical decision making (see chart for details).  Tylenol dose given for pain. Viscous lidocaine swish and spit was very successful in providing pain relief. Patient appearing more comfortable and able to move jaw better.  Discussed continue ibuprofen/tylenol, use lidocaine as needed.  Sent a few tabs tramadol for night time use.  PDMP reviewed. Discussed side effects. Recommend to call dentist Monday morning regarding pain. She will complete amoxicillin course as prescribed by dentist. Return precautions discussed. Patient agrees to plan  Final Clinical Impressions(s) / UC Diagnoses   Final  diagnoses:  Pain, dental  History of recent dental procedure     Discharge Instructions      Use the lidocaine swish and spit  for topical numbing. Continue alternating ibuprofen and tylenol every 4-6 hours for pain.  You can take the tramadol at night. This is a narcotic medication and will make you drowsy. If you are not driving, drinking alcohol, or making judgement decisions, you can take every 6 hours during the day.  Please call your dentist first thing Monday morning regarding your pain.     ED Prescriptions     Medication Sig Dispense Auth. Provider   traMADol (ULTRAM) 50 MG tablet Take 1 tablet (50 mg total) by mouth every 6 (six) hours as needed for up to 3 days. 9 tablet Jorgina Binning, PA-C   lidocaine (XYLOCAINE) 2 % solution Use as directed 15 mLs in the mouth or throat every hour as needed for mouth pain. Swish in the mouth and spit out 100 mL Samik Balkcom, Wells Guiles, PA-C      I have reviewed the PDMP during this encounter.   [place], [place] 05/20/22 1534

## 2022-05-20 NOTE — Discharge Instructions (Addendum)
Use the lidocaine swish and spit for topical numbing. Continue alternating ibuprofen and tylenol every 4-6 hours for pain.  You can take the tramadol at night. This is a narcotic medication and will make you drowsy. If you are not driving, drinking alcohol, or making judgement decisions, you can take every 6 hours during the day.  Please call your dentist first thing Monday morning regarding your pain.

## 2022-05-21 ENCOUNTER — Emergency Department (HOSPITAL_COMMUNITY)
Admission: EM | Admit: 2022-05-21 | Discharge: 2022-05-21 | Disposition: A | Discharge status: Home / Self Care | Payer: Medicaid Other | Source: Home / Self Care

## 2023-03-09 IMAGING — CT CT HEAD W/O CM
4 series · 17 of 47 positions shown, 19 images · non-contrast
Comparison: 09/09/2014

CLINICAL DATA: Syncope

EXAM:
CT HEAD WITHOUT CONTRAST
TECHNIQUE: Contiguous axial images were obtained from the base of the skull
through the vertex without intravenous contrast.

[Series 3: head without · axial · non-contrast · 0.42mm/px · z∈[+1190,+1304]mm · 7 of 31 slices shown, 9 images]
[im 4/31  brain]
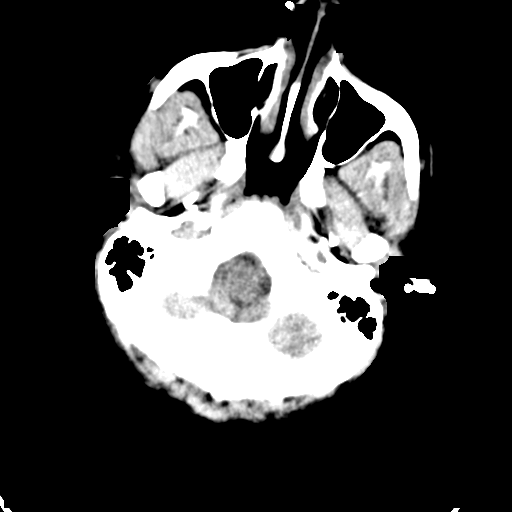
[im 4/31  bone]
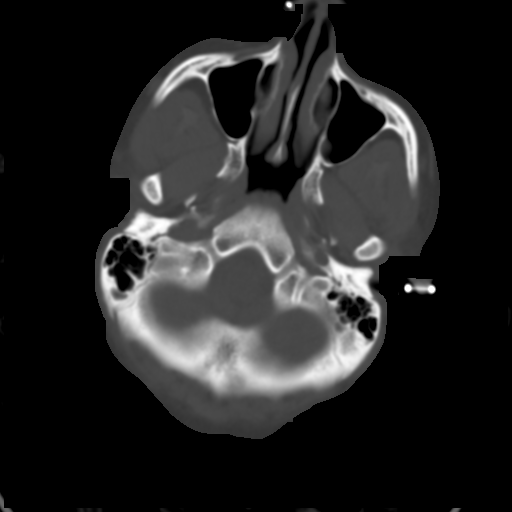
[im 8/31  brain]
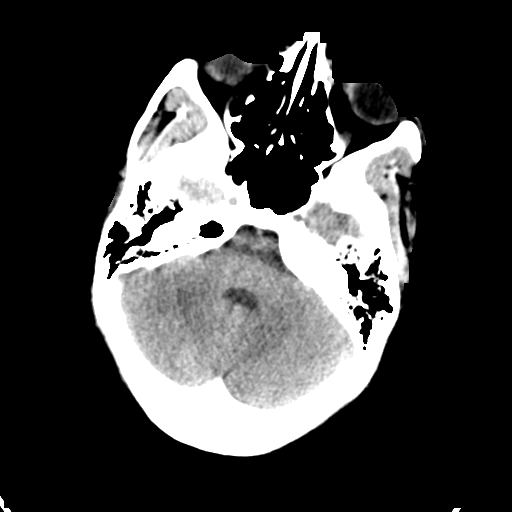
[im 12/31  brain]
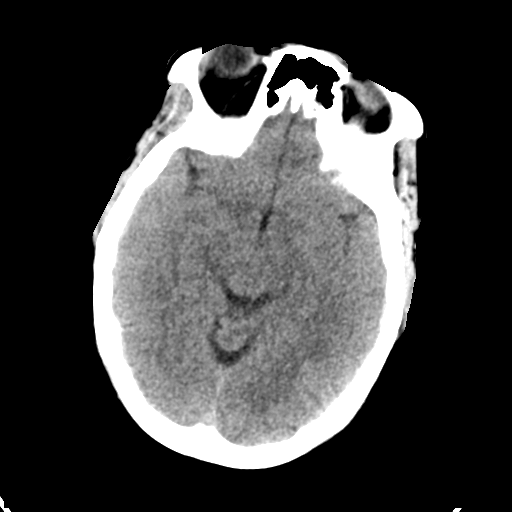
[im 16/31  brain]
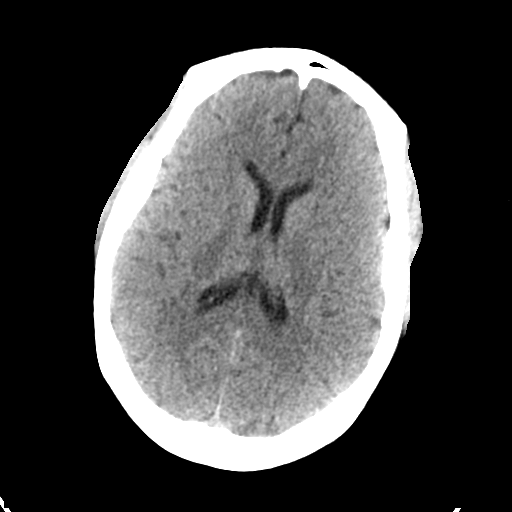
[im 19/31  brain]
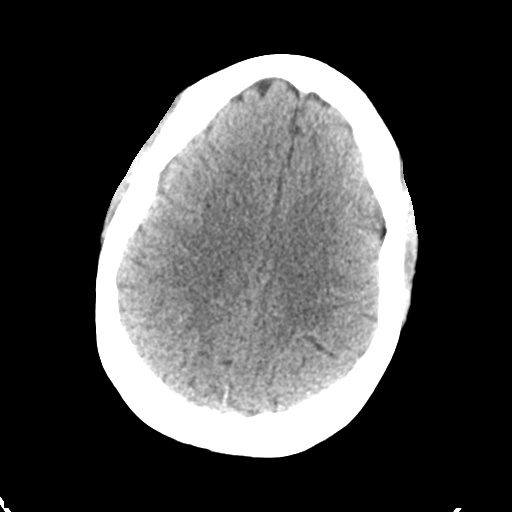
[im 19/31  bone]
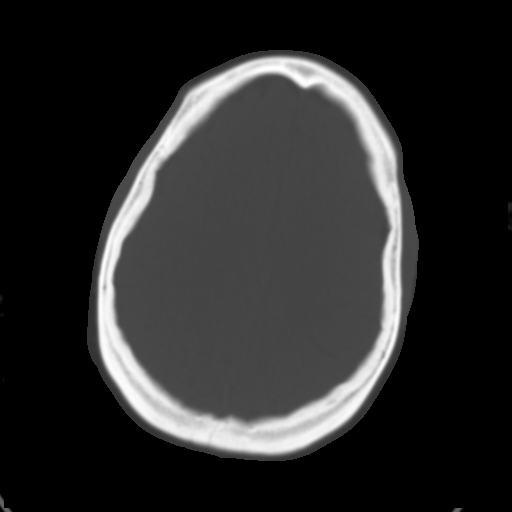
[im 23/31  brain]
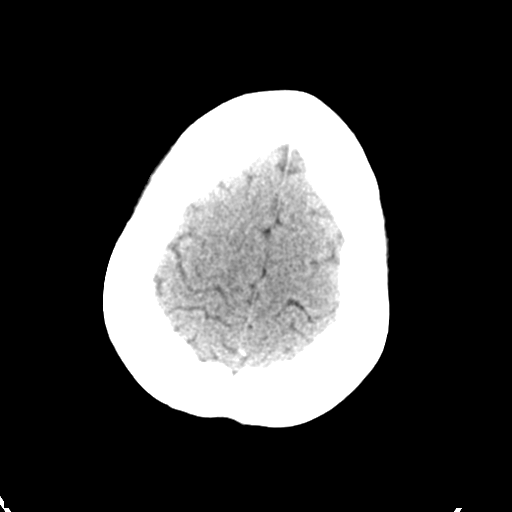
[im 27/31  brain]
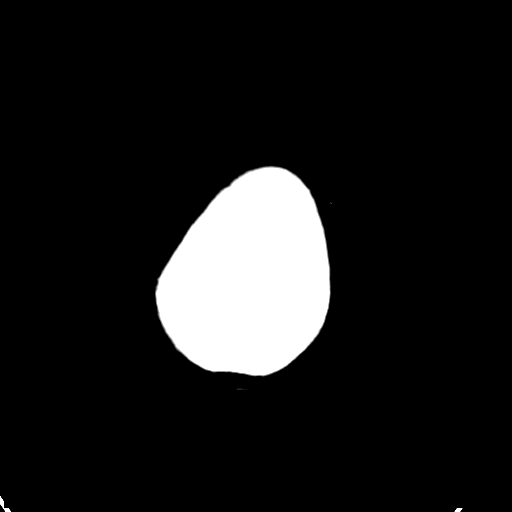

[Series 4: head bone · axial · 0.42mm/px · z∈[+1188,+1242]mm · 4 of 77 slices shown]
[im 8/77  bone]
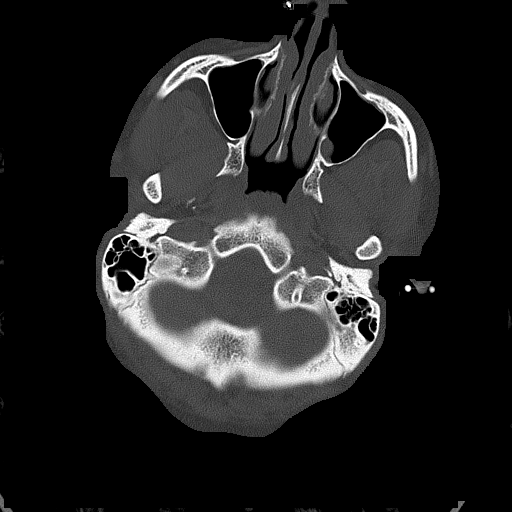
[im 16/77  bone]
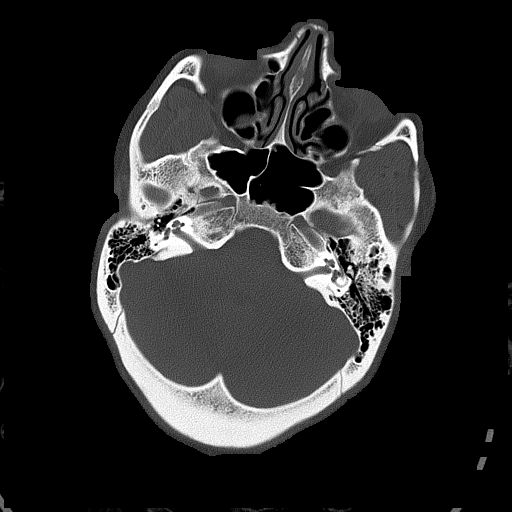
[im 23/77  bone]
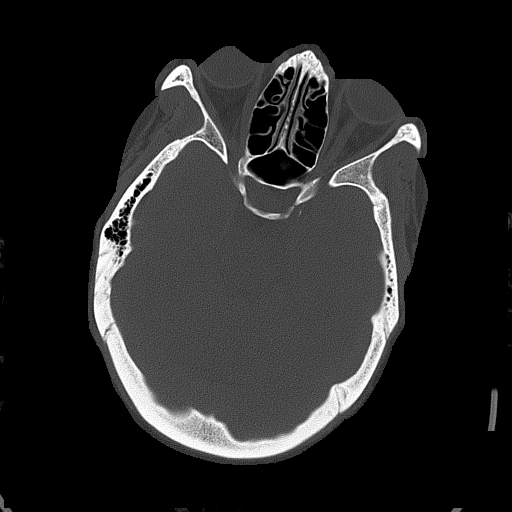
[im 35/77  bone]
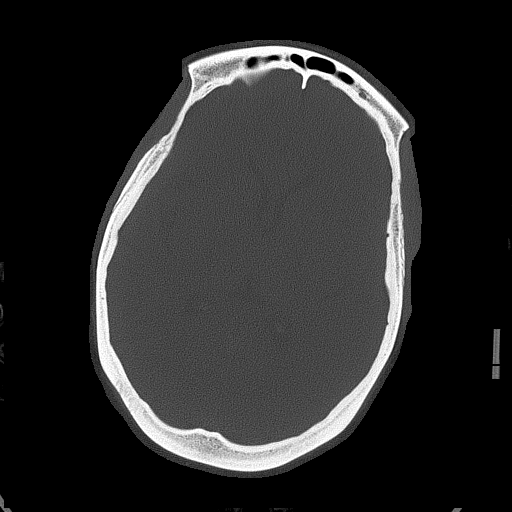

[Series 5: head without cor · coronal · non-contrast · 0.30mm/px · 3 of 71 slices shown]
[im 24/71  brain]
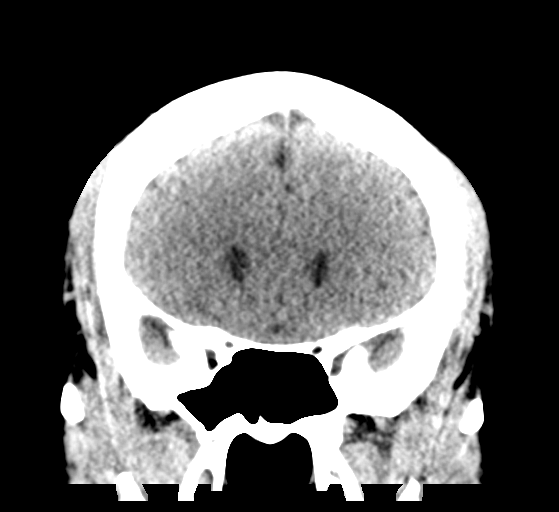
[im 32/71  brain]
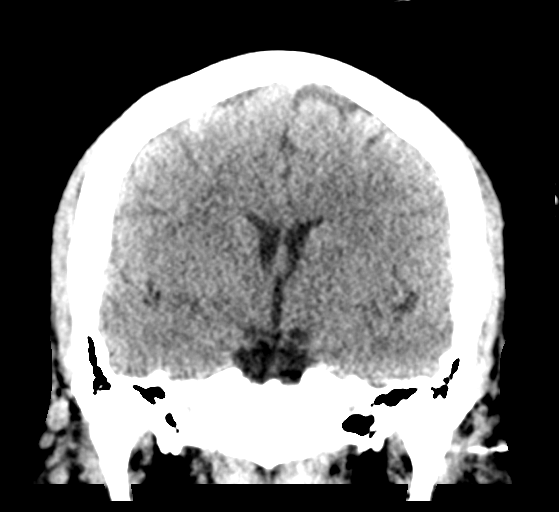
[im 39/71  brain]
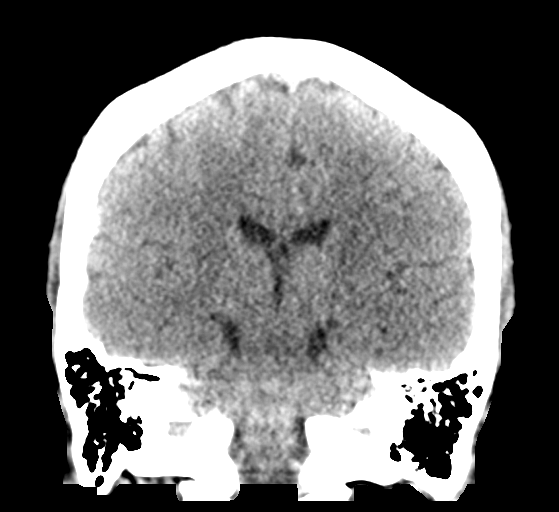

[Series 6: head without sag · sagittal · non-contrast · 0.30mm/px · 3 of 58 slices shown]
[im 20/58  brain]
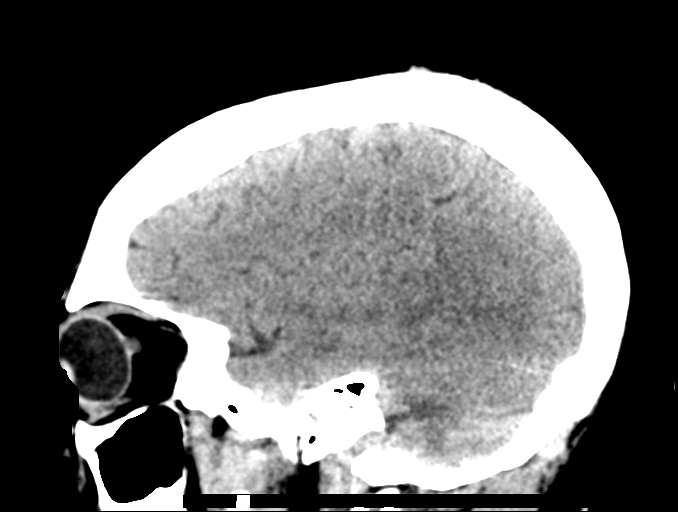
[im 29/58  brain]
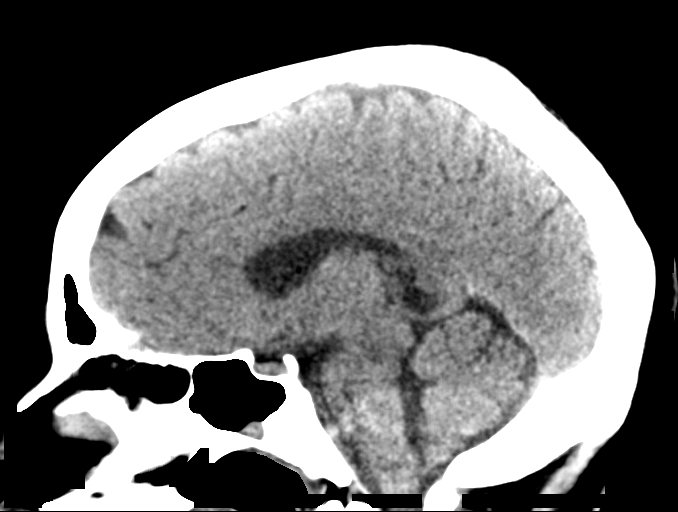
[im 39/58  brain]
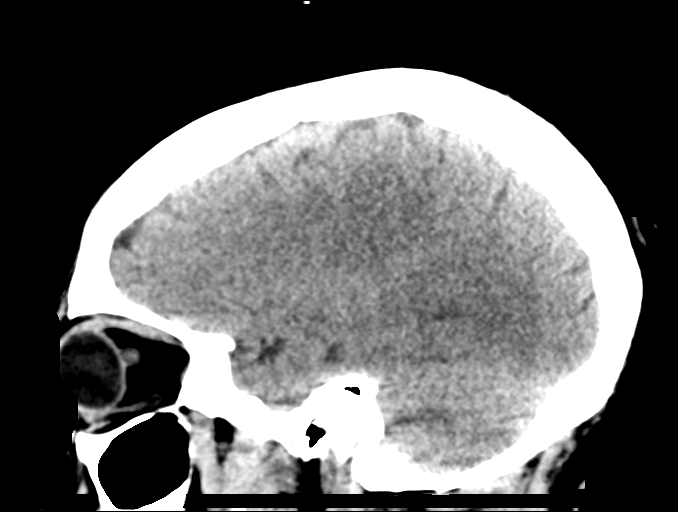

[17 of 47 positions shown; findings below may reference images not displayed]

FINDINGS: Brain: No acute intracranial abnormality. Specifically, no
hemorrhage, hydrocephalus, mass lesion, acute infarction, or
significant intracranial injury.

Vascular: No hyperdense vessel or unexpected calcification.

Skull: No acute calvarial abnormality.

Sinuses/Orbits: No acute findings

Other: None
IMPRESSION: Normal study.

## 2023-06-06 NOTE — L&D Delivery Note (Signed)
 Delivery Note Labor onset:  02/24/24 Labor Onset Time: 0600 Complete dilation at  0647 Onset of pushing at 0651 FHR second stage Cat 1 Analgesia/Anesthesia intrapartum: unmedicated  Guided pushing with maternal urge. Delivery of a viable female at 44. Fetal head delivered in LOA position.  Nuchal cord: x1, loose.  Infant placed on maternal abd, dried, and tactile stim.  Cord double clamped after 8 min and cut by father.  RN x3 present for birth.  Cord blood sample collected: Yes Placenta delivered Schultz side, intact, with 3 VC.  Placenta to L&D Yes. Uterine tone firm, bleeding small  No laceration identified.  QBL (mL): 100 Complications: none APGAR: APGAR (1 MIN): 7  APGAR (5 MINS): 9  APGAR (10 MINS):   Mom to [facility].  Baby to Couplet care / Skin to Skin. Baby girl, undecided on name   Heather KATHEE Peal DNP, CNM 02/24/2024, 7:58 AM

## 2023-11-07 LAB — OB RESULTS CONSOLE RPR: RPR: NONREACTIVE

## 2023-11-07 LAB — HEPATITIS C ANTIBODY: HCV Ab: NEGATIVE

## 2023-11-07 LAB — OB RESULTS CONSOLE RUBELLA ANTIBODY, IGM: Rubella: NON-IMMUNE/NOT IMMUNE

## 2023-11-07 LAB — OB RESULTS CONSOLE GC/CHLAMYDIA: Neisseria Gonorrhea: NEGATIVE

## 2023-11-07 LAB — OB RESULTS CONSOLE HIV ANTIBODY (ROUTINE TESTING): HIV: NONREACTIVE

## 2023-11-07 LAB — OB RESULTS CONSOLE HEPATITIS B SURFACE ANTIGEN: Hepatitis B Surface Ag: NEGATIVE

## 2023-11-26 ENCOUNTER — Other Ambulatory Visit: Payer: Self-pay | Admitting: Obstetrics and Gynecology

## 2023-11-26 ENCOUNTER — Telehealth: Payer: Self-pay

## 2023-11-26 DIAGNOSIS — O0930 Supervision of pregnancy with insufficient antenatal care, unspecified trimester: Secondary | ICD-10-CM

## 2023-11-26 NOTE — Telephone Encounter (Signed)
 Copied from CRM [id number]. Topic: Appointments - Appointment Scheduling >> Nov 26, 2023 11:00 AM Delon DASEN wrote: Katheryn with [facility]- needs to schedule patient- referring for sickle cell trait and pregnancy issues - [phone] x [phone]

## 2023-11-27 ENCOUNTER — Telehealth: Payer: Self-pay | Admitting: General Practice

## 2023-11-27 ENCOUNTER — Telehealth: Payer: Self-pay

## 2023-11-27 NOTE — Telephone Encounter (Signed)
 Copied from CRM [id number]. Topic: Appointments - Appointment Scheduling >> Nov 26, 2023 11:00 AM Delon DASEN wrote: Katheryn with [facility]- needs to schedule patient- referring for sickle cell trait and pregnancy issues - [phone] >> Nov 27, 2023 11:57 AM Delon DASEN wrote: Katheryn returning call to [facility] - please return call  [phone]

## 2023-11-27 NOTE — Telephone Encounter (Signed)
 I spoke with Heather Noble to inform the referring office that we have sent this referral to [facility].

## 2023-11-28 ENCOUNTER — Ambulatory Visit: Payer: Self-pay | Admitting: Nurse Practitioner

## 2023-11-28 ENCOUNTER — Encounter: Payer: Self-pay | Admitting: Nurse Practitioner

## 2023-11-28 VITALS — BP 119/67 | HR 90 | Temp 97.2°F | Ht 65.0 in | Wt 231.0 lb

## 2023-11-28 DIAGNOSIS — Z3490 Encounter for supervision of normal pregnancy, unspecified, unspecified trimester: Secondary | ICD-10-CM | POA: Insufficient documentation

## 2023-11-28 DIAGNOSIS — D649 Anemia, unspecified: Secondary | ICD-10-CM

## 2023-11-28 DIAGNOSIS — O99012 Anemia complicating pregnancy, second trimester: Secondary | ICD-10-CM

## 2023-11-28 DIAGNOSIS — Z3A25 25 weeks gestation of pregnancy: Secondary | ICD-10-CM | POA: Diagnosis not present

## 2023-11-28 DIAGNOSIS — O99019 Anemia complicating pregnancy, unspecified trimester: Secondary | ICD-10-CM

## 2023-11-28 NOTE — Progress Notes (Signed)
 New Patient Office Visit  Subjective:  Patient ID: Heather Noble, female    DOB: [birth year]  Age: 28 y.o. MRN: [id number]  CC: No chief complaint on file.   HPI Heather Noble is a 28 y.o. female  has a past medical history of Anemia during pregnancy (11/03/2016), Headache, Pregnancy induced hypertension, and Sickle cell trait (HCC) (11/03/2016).  Patient presented establish care.  Has no previous PCP  She is currently 6 months pregnant sees OB/GYN at [facility], this is her third pregnancy has 2 children ,she is taking prenatal vitamins daily.  Had labs done recently  by OB/GYN that showed that she has anemia, she had received IV Venofer during her last pregnancy and her current OB/GYN is considering iron  infusion for her, takes prenatal vitamin daily She currently denies fever, chills, chest pain, shortness of breath, abdominal pain, nausea, vomiting    Past Medical History:  Diagnosis Date   Anemia during pregnancy 11/03/2016   Headache    Pregnancy induced hypertension    Sickle cell trait (HCC) 11/03/2016    Past Surgical History:  Procedure Laterality Date   CESAREAN SECTION N/A 04/21/2017   Procedure: CESAREAN SECTION;  Surgeon: Herchel Gloris LABOR, MD;  Location: [facility];  Service: Obstetrics;  Laterality: N/A;   WISDOM TOOTH EXTRACTION  2015    Family History  Problem Relation Age of Onset   Hypertension Maternal Grandmother    Osteoarthritis Maternal Grandmother    Gout Maternal Grandmother    Thyroid disease Maternal Grandmother    Diabetes Maternal Grandmother    Cancer Paternal Grandfather        unknown    Social History   Socioeconomic History   Marital status: Single    Spouse name: Not on file   Number of children: 2   Years of education: Not on file   Highest education level: Not on file  Occupational History   Not on file  Tobacco Use   Smoking status: Never   Smokeless tobacco: Never  Vaping Use   Vaping  status: Never Used  Substance and Sexual Activity   Alcohol use: No    Comment: occ   Drug use: No   Sexual activity: Yes    Partners: Male    Birth control/protection: Condom  Other Topics Concern   Not on file  Social History Narrative   Lives with her fiance and 2 kids    Social Drivers of Corporate investment banker Strain: Not on file  Food Insecurity: Not on file  Transportation Needs: Not on file  Physical Activity: Not on file  Stress: Not on file  Social Connections: Not on file  Intimate Partner Violence: Not on file    ROS Review of Systems  Constitutional:  Negative for appetite change, chills, fatigue and fever.  HENT:  Negative for congestion, postnasal drip, rhinorrhea and sneezing.   Respiratory:  Negative for cough, shortness of breath and wheezing.   Cardiovascular:  Negative for chest pain, palpitations and leg swelling.  Gastrointestinal:  Negative for abdominal pain, constipation, nausea and vomiting.  Genitourinary:  Negative for difficulty urinating, dysuria, flank pain and frequency.  Musculoskeletal:  Negative for arthralgias, back pain, joint swelling and myalgias.  Skin:  Negative for color change, pallor, rash and wound.  Neurological:  Negative for dizziness, facial asymmetry, weakness, numbness and headaches.  Psychiatric/Behavioral:  Negative for behavioral problems, confusion, self-injury and suicidal ideas.     Objective:   Today's Vitals: BP 119/67  Pulse 90   Temp (!) 97.2 F (36.2 C)   Ht 5' 5 (1.651 m)   Wt 231 lb (104.8 kg)   SpO2 100%   BMI 38.44 kg/m   Physical Exam Vitals and nursing note reviewed.  Constitutional:      General: She is not in acute distress.    Appearance: Normal appearance. She is not ill-appearing, toxic-appearing or diaphoretic.   Eyes:     General: No scleral icterus.       Right eye: No discharge.        Left eye: No discharge.     Extraocular Movements: Extraocular movements intact.      Conjunctiva/sclera: Conjunctivae normal.    Cardiovascular:     Rate and Rhythm: Normal rate and regular rhythm.     Pulses: Normal pulses.     Heart sounds: Normal heart sounds. No murmur heard.    No friction rub. No gallop.  Pulmonary:     Effort: Pulmonary effort is normal. No respiratory distress.     Breath sounds: Normal breath sounds. No stridor. No wheezing, rhonchi or rales.  Chest:     Chest wall: No tenderness.  Abdominal:     General: There is no distension.     Palpations: Abdomen is soft.     Tenderness: There is no abdominal tenderness. There is no right CVA tenderness, left CVA tenderness or guarding.   Musculoskeletal:        General: No swelling, tenderness, deformity or signs of injury.     Right lower leg: No edema.     Left lower leg: No edema.   Skin:    General: Skin is warm and dry.     Capillary Refill: Capillary refill takes less than 2 seconds.     Coloration: Skin is not jaundiced or pale.     Findings: No bruising, erythema or lesion.   Neurological:     Mental Status: She is alert and oriented to person, place, and time.     Motor: No weakness.     Coordination: Coordination normal.     Gait: Gait normal.   Psychiatric:        Mood and Affect: Mood normal.        Behavior: Behavior normal.        Thought Content: Thought content normal.        Judgment: Judgment normal.     Assessment & Plan:   Problem List Items Addressed This Visit       Other   Anemia during pregnancy - Primary   Followed by OB/GYN They plan on doing iron  transfusion hide in the hospital Continue prenatal vitamins Lab Results  Component Value Date   WBC 6.1 12/27/2021   HGB 10.9 (L) 12/27/2021   HGB 10.8 (L) 12/27/2021   HCT 33.4 (L) 12/27/2021   HCT 35.7 12/27/2021   MCV 89.8 12/27/2021   MCV 95.5 12/27/2021   PLT 457 (H) 12/27/2021         Pregnancy   Patient encouraged to maintain close follow-up with a gynecologist       Outpatient Encounter  Medications as of 11/28/2023  Medication Sig   Prenatal Vit-Fe Fumarate-FA (PRENATAL VITAMIN PO) Take by mouth.   norgestimate -ethinyl estradiol  (ORTHO-CYCLEN) 0.25-35 MG-MCG tablet Take 1 tablet by mouth daily. (Patient not taking: Reported on 11/28/2023)   [DISCONTINUED] AMLODIPINE  BESYLATE PO Take by mouth.   [DISCONTINUED] enalapril  (VASOTEC ) 10 MG tablet Take 1 tablet (10 mg total) by mouth  daily.   [DISCONTINUED] lidocaine  (XYLOCAINE ) 2 % solution Use as directed 15 mLs in the mouth or throat every hour as needed for mouth pain. Swish in the mouth and spit out   [DISCONTINUED] naproxen  sodium (ANAPROX  DS) 550 MG tablet Take 1 tablet (550 mg total) by mouth 2 (two) times daily with a meal. For pain relief.   [DISCONTINUED] penicillin  v potassium (VEETID) 500 MG tablet Take 1 tablet (500 mg total) by mouth 3 (three) times daily.   [DISCONTINUED] Prenatal MV & Min w/FA-DHA ([employer] PRENATAL GUMMY) 0.4-25 MG CHEW Chew 3 tablets by mouth daily. (Patient not taking: Reported on 11/28/2023)   No facility-administered encounter medications on file as of 11/28/2023.    Follow-up: Return in about 5 months (around 04/29/2024) for CPE.   Iman Orourke R Tane Biegler, FNP

## 2023-11-28 NOTE — Assessment & Plan Note (Signed)
 Followed by OB/GYN They plan on doing iron  transfusion hide in the hospital Continue prenatal vitamins Lab Results  Component Value Date   WBC 6.1 12/27/2021   HGB 10.9 (L) 12/27/2021   HGB 10.8 (L) 12/27/2021   HCT 33.4 (L) 12/27/2021   HCT 35.7 12/27/2021   MCV 89.8 12/27/2021   MCV 95.5 12/27/2021   PLT 457 (H) 12/27/2021

## 2023-11-28 NOTE — Assessment & Plan Note (Signed)
 Patient encouraged to maintain close follow-up with a gynecologist

## 2023-11-28 NOTE — Patient Instructions (Signed)

## 2023-12-12 ENCOUNTER — Non-Acute Institutional Stay (HOSPITAL_COMMUNITY)
Admission: RE | Admit: 2023-12-12 | Discharge: 2023-12-12 | Disposition: A | Discharge status: Home / Self Care | Source: Ambulatory Visit | Attending: Internal Medicine | Admitting: Internal Medicine

## 2023-12-12 DIAGNOSIS — Z3A00 Weeks of gestation of pregnancy not specified: Secondary | ICD-10-CM | POA: Insufficient documentation

## 2023-12-12 DIAGNOSIS — O99019 Anemia complicating pregnancy, unspecified trimester: Secondary | ICD-10-CM | POA: Insufficient documentation

## 2023-12-12 MED ORDER — SODIUM CHLORIDE 0.9 % IV SOLN
510.0000 mg | Freq: Once | INTRAVENOUS | Status: AC
  Administered 2023-12-12: 510 mg via INTRAVENOUS
  Filled 2023-12-12: qty 17

## 2023-12-12 MED ORDER — ACETAMINOPHEN 500 MG PO TABS
1000.0000 mg | ORAL_TABLET | Freq: Once | ORAL | Status: AC
  Administered 2023-12-12: 1000 mg via ORAL
  Filled 2023-12-12: qty 2

## 2023-12-12 MED ORDER — SODIUM CHLORIDE 0.9 % IV SOLN: INTRAVENOUS | Status: DC | PRN

## 2023-12-12 MED ORDER — DIPHENHYDRAMINE HCL 25 MG PO CAPS
25.0000 mg | ORAL_CAPSULE | Freq: Once | ORAL | Status: AC
  Administered 2023-12-12: 25 mg via ORAL
  Filled 2023-12-12: qty 1

## 2023-12-12 NOTE — Progress Notes (Signed)
 PATIENT CARE CENTER NOTE  Diagnosis: ICD-10:099.019: Anemia complicating pregnancy, unspecified trimester  Provider: Orie Bonus, CNM  Procedure: Feraheme  510 mg (dose #1 of 2)   Note: Patient received Feraheme  510 mg     infusion (dose #1 of 2) via PIV. Pt pre-medicated per orders with PO Tylenol  1,000 mg and PO Benadryl  25 mg. Pt Tolerated infusion well with no adverse reaction. Vital signs stable. Pt wanted to rest after infusion, and pt observed for 1.5 hours post infusion. AVS printed and given to pt.   Patient advised that she should return in 1 week for next dose, and can schedule appointment at the front desk. Pt is alert, oriented and ambulatory at discharge.

## 2023-12-18 ENCOUNTER — Encounter (HOSPITAL_COMMUNITY)

## 2023-12-19 ENCOUNTER — Non-Acute Institutional Stay (HOSPITAL_COMMUNITY)
Admission: RE | Admit: 2023-12-19 | Discharge: 2023-12-19 | Disposition: A | Discharge status: Home / Self Care | Source: Ambulatory Visit | Attending: Internal Medicine | Admitting: Internal Medicine

## 2023-12-19 DIAGNOSIS — Z3A00 Weeks of gestation of pregnancy not specified: Secondary | ICD-10-CM | POA: Diagnosis not present

## 2023-12-19 DIAGNOSIS — O99019 Anemia complicating pregnancy, unspecified trimester: Secondary | ICD-10-CM | POA: Diagnosis present

## 2023-12-19 MED ORDER — DIPHENHYDRAMINE HCL 25 MG PO CAPS
25.0000 mg | ORAL_CAPSULE | Freq: Once | ORAL | Status: AC
  Administered 2023-12-19: 25 mg via ORAL
  Filled 2023-12-19: qty 1

## 2023-12-19 MED ORDER — ACETAMINOPHEN 500 MG PO TABS
1000.0000 mg | ORAL_TABLET | Freq: Once | ORAL | Status: AC
  Administered 2023-12-19: 1000 mg via ORAL
  Filled 2023-12-19: qty 2

## 2023-12-19 MED ORDER — SODIUM CHLORIDE 0.9 % IV SOLN
510.0000 mg | Freq: Once | INTRAVENOUS | Status: AC
  Administered 2023-12-19: 510 mg via INTRAVENOUS
  Filled 2023-12-19: qty 17

## 2023-12-19 MED ORDER — SODIUM CHLORIDE 0.9 % IV SOLN
INTRAVENOUS | Status: DC | PRN
  Administered 2023-12-19: 10 mL/h via INTRAVENOUS

## 2023-12-19 NOTE — Progress Notes (Addendum)
 PATIENT CARE CENTER NOTE:  Diagnosis: ICD -89:900980: Anemia complicating pregnancy, unspecified trimester  Provider: Orie Bonus CNM  Procedure: Feraheme  510mg  infusion   Patient received IV Feraheme   (dose #2 of 2). Pt premedicated with PO Tylenol  and PO benadryl  prior to infusion per orders. Observed for 1 hour post infusion, per pt benadryl  makes her drowsy and wanted to wait 30 additional minutes today before leaving. Tolerated well, no adverse reaction noted, vitals stable. Pt declined printed AVS.  Patient alert, oriented, and ambulatory at the time of discharge.

## 2023-12-26 DIAGNOSIS — Z8759 Personal history of other complications of pregnancy, childbirth and the puerperium: Secondary | ICD-10-CM | POA: Insufficient documentation

## 2023-12-26 DIAGNOSIS — O0933 Supervision of pregnancy with insufficient antenatal care, third trimester: Secondary | ICD-10-CM | POA: Insufficient documentation

## 2023-12-26 DIAGNOSIS — O09293 Supervision of pregnancy with other poor reproductive or obstetric history, third trimester: Secondary | ICD-10-CM | POA: Insufficient documentation

## 2023-12-26 DIAGNOSIS — O43193 Other malformation of placenta, third trimester: Secondary | ICD-10-CM | POA: Insufficient documentation

## 2024-01-04 ENCOUNTER — Ambulatory Visit (HOSPITAL_BASED_OUTPATIENT_CLINIC_OR_DEPARTMENT_OTHER): Admitting: Obstetrics and Gynecology

## 2024-01-04 ENCOUNTER — Ambulatory Visit: Attending: Obstetrics and Gynecology

## 2024-01-04 VITALS — BP 121/72 | HR 85

## 2024-01-04 DIAGNOSIS — Z3A32 32 weeks gestation of pregnancy: Secondary | ICD-10-CM | POA: Insufficient documentation

## 2024-01-04 DIAGNOSIS — D573 Sickle-cell trait: Secondary | ICD-10-CM

## 2024-01-04 DIAGNOSIS — Z8759 Personal history of other complications of pregnancy, childbirth and the puerperium: Secondary | ICD-10-CM

## 2024-01-04 DIAGNOSIS — O09293 Supervision of pregnancy with other poor reproductive or obstetric history, third trimester: Secondary | ICD-10-CM

## 2024-01-04 DIAGNOSIS — O34219 Maternal care for unspecified type scar from previous cesarean delivery: Secondary | ICD-10-CM

## 2024-01-04 DIAGNOSIS — O0933 Supervision of pregnancy with insufficient antenatal care, third trimester: Secondary | ICD-10-CM | POA: Diagnosis present

## 2024-01-04 DIAGNOSIS — O0930 Supervision of pregnancy with insufficient antenatal care, unspecified trimester: Secondary | ICD-10-CM | POA: Insufficient documentation

## 2024-01-04 DIAGNOSIS — O99013 Anemia complicating pregnancy, third trimester: Secondary | ICD-10-CM | POA: Diagnosis not present

## 2024-01-04 DIAGNOSIS — O43193 Other malformation of placenta, third trimester: Secondary | ICD-10-CM | POA: Insufficient documentation

## 2024-01-04 NOTE — Progress Notes (Signed)
 Maternal-Fetal Medicine Consultation Name: Heather Noble MRN: [id number]  G3 P2002 at 32w 5d gestation. -Late prenatal care.  Patient is here for fetal anatomy scan.  She reports that she had cell-free fetal DNA screening that did not show increased risks for fetal aneuploidies. 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 -Sickle cell trait.  Her partner has not been screened.  All her children or from the same partner and they do not have sickle cell disease. -Previous cesarean delivery followed by a successful vaginal birth after cesarean delivery (VBAC).  Ultrasound Fetal growth is appropriate for gestational age.  Amniotic fluid normal good fetal activity seen.  No obvious fetal structural defects are seen.  Fetal anatomical survey appears normal but limited by advanced gestational age. Placental cord insertion appears normal.  Placenta is anterior and there is no evidence of previa or placenta accreta spectrum. Patient understands limitations of ultrasound in detecting fetal anomalies.  Our concerns include Previous cesarean delivery Patient had a successful VBAC and she has a high likelihood of having another VBAC.  I reassured the patient of the placental location.  Repeat cesarean deliveries increase the risks of placenta previa and/or placenta accreta spectrum. Patient is keen on VBAC.  Sickle cell trait Patient is aware that there is a 1 in 4 chance of having baby with sickle cell disease if her partner is a carrier.  I encouraged her to screen her partner.  Patient gives history of gestational hypertension/preeclampsia and is second pregnancy.  She is not taking low-dose aspirin .   Low-dose aspirin  is unlikely to benefit at this gestational age.  Recommendations - Fetal growth assessment in 4 weeks that may be performed at your office    Consultation including face-to-face (more than 50%) counseling 30 minutes.

## 2024-01-15 ENCOUNTER — Ambulatory Visit

## 2024-01-16 ENCOUNTER — Ambulatory Visit: Payer: Self-pay | Admitting: Nurse Practitioner

## 2024-01-21 NOTE — Therapy (Signed)
 OUTPATIENT PHYSICAL THERAPY LOWER EXTREMITY EVALUATION   Patient Name: Zyra Parrillo MRN: [id number] DOB:1995-09-24, 28 y.o., female Today's Date: 01/21/2024  END OF SESSION:   Past Medical History:  Diagnosis Date   Anemia during pregnancy 11/03/2016   Headache    Pregnancy induced hypertension    Sickle cell trait (HCC) 11/03/2016   Past Surgical History:  Procedure Laterality Date   CESAREAN SECTION N/A 04/21/2017   Procedure: CESAREAN SECTION;  Surgeon: Herchel Gloris LABOR, MD;  Location: [facility];  Service: Obstetrics;  Laterality: N/A;   WISDOM TOOTH EXTRACTION  2015   Patient Active Problem List   Diagnosis Date Noted   History of severe pre-eclampsia 12/26/2023   History of postpartum hemorrhage, currently pregnant in third trimester 12/26/2023   Marginal insertion of umbilical cord affecting management of mother in third trimester 12/26/2023   Late prenatal care affecting pregnancy in third trimester 12/26/2023   Pregnancy 11/28/2023   Seizure-like activity (HCC) 06/05/2021   History of gestational hypertension 04/21/2017   Sickle cell trait (HCC) 11/03/2016   Anemia during pregnancy 11/03/2016    PCP: Paseda, Folashade R, FNP   REFERRING PROVIDER: Armond Cape, MD   REFERRING DIAG: M25.559 (ICD-10-CM) - Pain in unspecified hip   THERAPY DIAG:  No diagnosis found.  Rationale for Evaluation and Treatment: Rehabilitation  ONSET DATE: 2 months ago  SUBJECTIVE:   SUBJECTIVE STATEMENT: Hip pain became very sever 2 months ago. She has had LBP the entire pregnancy. Patient is [redacted] weeks pregnant. EDD 02/24/24. Third pregnancy. Has a 29 yo and 29 yo. Patient reports back pain when not pregnant,but not hip pain. Right hip usually hurts the most. Uses step stool to get into bed. Sometimes has to lift leg.   PERTINENT HISTORY: Csection 2018 and VBAC for second,  anemia and HTN during pregnancy PAIN:  Are you having pain? Yes: NPRS scale: 10/10 Pain  location: front sides and back of hips Pain description: sharp pain Aggravating factors: sleeping  Relieving factors: pillow between knees or under knees helps some, LTR  PRECAUTIONS: Other: pregnant  RED FLAGS: None   WEIGHT BEARING RESTRICTIONS: No  FALLS:  Has patient fallen in last 6 months? Yes. 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  DIAGNOSTIC FINDINGS: none  PATIENT SURVEYS:  PSFS: THE PATIENT SPECIFIC FUNCTIONAL SCALE  Place score of 0-10 (0 = unable to perform activity and 10 = able to perform activity at the same level as before injury or problem)  Activity Date: 01/22/24    Sleeping 4    2.sit to stand transition 5    3.sitting/driving 3    4.      Total Score 4      Total Score = Sum of activity scores/number of activities  Minimally Detectable Change: 3 points (for single activity); 2 points (for average score)  [name]. The Patient Specific Functional Scale . Retrieved from [email or web address]   COGNITION: Overall cognitive status: Within functional limits for tasks assessed     SENSATION: WFL  MUSCLE LENGTH: Piriformis B, L HS  POSTURE: increased lumbar lordosis  PALPATION: Marked pain in gluteals, SIJ  LOWER EXTREMITY ROM: WFL for tasks assessed  LOWER EXTREMITY MMT: Hips 4+/5, pain with hip flexion and ABD   FUNCTIONAL TESTS:  5 times sit to stand: 27.84 sec                                                                                                                                TREATMENT DATE:   01/22/24 See pt ed and HEP  KT tape 5 I strips to abdomen  PATIENT EDUCATION:  Education details: PT eval findings, anticipated POC, initial HEP, and KT tape precautions   Person educated: Patient Education method: Explanation, Demonstration, Tactile cues, Verbal cues, and Handouts Education comprehension: verbalized understanding and returned demonstration  HOME EXERCISE PROGRAM: Access Code: [email or web address] URL: [email or web address] Date: 01/22/2024 Prepared by: Mliss  Exercises - Seated Lateral Trunk Stretch on Swiss Ball  - 1 x daily - 7 x weekly - 3 reps - 5 breaths hold - Seated Hip Adduction Squeeze with Ball  - 2 x daily - 7 x weekly - 2 sets - 10 reps - 5 hold - Seated Piriformis Stretch  - 3 x daily - 7 x weekly - 1 sets - 3 reps - 30-60 sec hold - Diaphragmatic Breathing in Supported Child's Pose with Pelvic Floor Relaxation  - 1 x daily - 3 x weekly - 2 sets - 10 reps - Quadruped Cat Cow  - 1 x daily - 3 x weekly - 2 sets - 10 reps  Pt education: KT tape and aquatic info   ASSESSMENT:  CLINICAL IMPRESSION: Patient is a 28 y.o. female who was seen today for physical therapy evaluation and treatment for bil hip pain beginning 2-3 months ago. She is [redacted] weeks pregnant. She rates her pain at 10/10 and has increased pain with supine and sidelying, prolonged sitting, sit to stand transfers and walking. Her pain affects her ability to sleep, work and take care of her family. She also reports low back pain. She has functional hip ROM, but flexibility deficits and marked TTP in hip musculature. She will benefit from skilled PT to address these deficits and those listed below. She would likely benefit from aquatic PT as well due to her intolerance to lying down.   OBJECTIVE IMPAIRMENTS: decreased activity tolerance, difficulty walking, decreased strength, increased muscle spasms, impaired flexibility, postural  dysfunction, and pain.   ACTIVITY LIMITATIONS: carrying, lifting, bending, sitting, standing, squatting, sleeping, stairs, transfers, bed mobility, bathing, toileting, dressing, hygiene/grooming, locomotion level, and caring for others  PARTICIPATION LIMITATIONS: meal prep, cleaning, laundry, driving, shopping, community activity, and occupation  PERSONAL FACTORS: Time since onset of injury/illness/exacerbation and 1 comorbidity: pregnancy are also affecting patient's functional outcome.   REHAB POTENTIAL: Good  CLINICAL DECISION MAKING: Stable/uncomplicated  EVALUATION COMPLEXITY: Low   GOALS: Goals reviewed with patient? Yes  SHORT TERM GOALS: Target date: 02/19/2024  The patient will demonstrate  knowledge of basic self care strategies and exercises to promote healing  including MFR  Baseline: Goal status: INITIAL  2.  The patient will report a 30% improvement in pain levels with functional activities which are currently difficult including sit to stand, turning over in bed and bending Baseline:  Goal status: INITIAL  3.  Ind with initial HEP Baseline:  Goal status: INITIAL    LONG TERM GOALS: Target date: 03/18/2024  The patient will be independent in a safe self progression of a home exercise program to promote further recovery of function   Baseline:  Goal status: INITIAL  2. The patient will report a 75% improvement in pain levels with functional activities which are currently difficult including bed mobility, sit to stand, sitting and walking. Baseline:  Goal status: INITIAL  3.   Improved PSFS score to 2 (average) indicating improved function with less pain Baseline:  Goal status: INITIAL    PLAN:  PT FREQUENCY: 2x/week  PT DURATION: 8 weeks  PLANNED INTERVENTIONS: 97164- PT Re-evaluation, 97110-Therapeutic exercises, 97530- Therapeutic activity, 97112- Neuromuscular re-education, 97535- Self Care, 02859- Manual therapy, [id number]- Aquatic Therapy, [phone]-  Ionotophoresis 4mg /ml Dexamethasone , 79439 (1-2 muscles), 20561 (3+ muscles)- Dry Needling, Patient/Family education, Taping, Joint mobilization, Spinal mobilization, Cryotherapy, and Moist heat  PLAN FOR NEXT SESSION: Review and progress HEP, Land: add ITB stretch, HS stretch, hip flexor stretch, ADDuctor stretch, core and LE strengthening, manual to B gluteals and lumbar.   Mliss Cummins, PT 01/21/24 9:28 PM

## 2024-01-22 ENCOUNTER — Ambulatory Visit: Payer: Self-pay | Admitting: Nurse Practitioner

## 2024-01-22 ENCOUNTER — Ambulatory Visit: Attending: Obstetrics and Gynecology | Admitting: Physical Therapy

## 2024-01-22 ENCOUNTER — Other Ambulatory Visit: Payer: Self-pay

## 2024-01-22 DIAGNOSIS — M25551 Pain in right hip: Secondary | ICD-10-CM | POA: Diagnosis present

## 2024-01-22 DIAGNOSIS — M5459 Other low back pain: Secondary | ICD-10-CM | POA: Insufficient documentation

## 2024-01-22 DIAGNOSIS — R252 Cramp and spasm: Secondary | ICD-10-CM | POA: Insufficient documentation

## 2024-01-22 DIAGNOSIS — M25552 Pain in left hip: Secondary | ICD-10-CM | POA: Insufficient documentation

## 2024-01-23 ENCOUNTER — Ambulatory Visit (HOSPITAL_BASED_OUTPATIENT_CLINIC_OR_DEPARTMENT_OTHER): Attending: Obstetrics and Gynecology | Admitting: Physical Therapy

## 2024-01-23 ENCOUNTER — Encounter (HOSPITAL_BASED_OUTPATIENT_CLINIC_OR_DEPARTMENT_OTHER): Payer: Self-pay | Admitting: Physical Therapy

## 2024-01-23 DIAGNOSIS — M25551 Pain in right hip: Secondary | ICD-10-CM | POA: Diagnosis present

## 2024-01-23 DIAGNOSIS — M5459 Other low back pain: Secondary | ICD-10-CM | POA: Insufficient documentation

## 2024-01-23 DIAGNOSIS — R252 Cramp and spasm: Secondary | ICD-10-CM | POA: Insufficient documentation

## 2024-01-23 DIAGNOSIS — M25552 Pain in left hip: Secondary | ICD-10-CM | POA: Insufficient documentation

## 2024-01-23 NOTE — Therapy (Signed)
 OUTPATIENT PHYSICAL THERAPY LOWER EXTREMITY    Patient Name: Heather Noble MRN: [id number] DOB:Oct 30, 1995, 28 y.o., female Today's Date: 01/23/2024  END OF SESSION:  PT End of Session - 01/23/24 0953     Visit Number 2    Date for PT Re-Evaluation 03/18/24    Authorization Type [employer] MCD no auth required    PT Start Time 0945   pt arrives late   PT Stop Time 1015    PT Time Calculation (min) 30 min    Activity Tolerance Patient tolerated treatment well    Behavior During Therapy [facility] for tasks assessed/performed          Past Medical History:  Diagnosis Date   Anemia during pregnancy 11/03/2016   Headache    Pregnancy induced hypertension    Sickle cell trait (HCC) 11/03/2016   Past Surgical History:  Procedure Laterality Date   CESAREAN SECTION N/A 04/21/2017   Procedure: CESAREAN SECTION;  Surgeon: Herchel Gloris LABOR, MD;  Location: [facility];  Service: Obstetrics;  Laterality: N/A;   WISDOM TOOTH EXTRACTION  2015   Patient Active Problem List   Diagnosis Date Noted   History of severe pre-eclampsia 12/26/2023   History of postpartum hemorrhage, currently pregnant in third trimester 12/26/2023   Marginal insertion of umbilical cord affecting management of mother in third trimester 12/26/2023   Late prenatal care affecting pregnancy in third trimester 12/26/2023   Pregnancy 11/28/2023   Seizure-like activity (HCC) 06/05/2021   History of gestational hypertension 04/21/2017   Sickle cell trait (HCC) 11/03/2016   Anemia during pregnancy 11/03/2016    PCP: Paseda, Folashade R, FNP   REFERRING PROVIDER: Armond Cape, MD   REFERRING DIAG: M25.559 (ICD-10-CM) - Pain in unspecified hip   THERAPY DIAG:  Pain in right hip  Pain in left hip  Cramp and spasm  Other low back pain  Rationale for Evaluation and Treatment: Rehabilitation  ONSET DATE: 2 months ago  SUBJECTIVE:   SUBJECTIVE STATEMENT: Pain in right hip much better today after the  therapy yesterday 2/10   Initial Subjective Hip pain became very sever 2 months ago. She has had LBP the entire pregnancy. Patient is [redacted] weeks pregnant. EDD 02/24/24. Third pregnancy. Has a 29 yo and 29 yo. Patient reports back pain when not pregnant,but not hip pain. Right hip usually hurts the most. Uses step stool to get into bed. Sometimes has to lift leg.   PERTINENT HISTORY: Csection 2018 and VBAC for second,  anemia and HTN during pregnancy PAIN:  Are you having pain? Yes: NPRS scale: 10/10 Pain location: front sides and back of hips Pain description: sharp pain Aggravating factors: sleeping  Relieving factors: pillow between knees or under knees helps some, LTR  PRECAUTIONS: Other: pregnant  RED FLAGS: None   WEIGHT BEARING RESTRICTIONS: No  FALLS:  Has patient fallen in last 6 months? Yes. 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  DIAGNOSTIC FINDINGS: none  PATIENT SURVEYS:  PSFS: THE PATIENT SPECIFIC FUNCTIONAL SCALE  Place score of 0-10 (0 = unable to perform activity and 10 =  able to perform activity at the same level as before injury or problem)  Activity Date: 01/22/24    Sleeping 4    2.sit to stand transition 5    3.sitting/driving 3    4.      Total Score 4      Total Score = Sum of activity scores/number of activities  Minimally Detectable Change: 3 points (for single activity); 2 points (for average score)  [name]. The Patient Specific Functional Scale . Retrieved from  [email or web address]   COGNITION: Overall cognitive status: Within functional limits for tasks assessed     SENSATION: WFL  MUSCLE LENGTH: Piriformis B, L HS  POSTURE: increased lumbar lordosis  PALPATION: Marked pain in gluteals, SIJ  LOWER EXTREMITY ROM: WFL for tasks assessed  LOWER EXTREMITY MMT: Hips 4+/5, pain with hip flexion and ABD   FUNCTIONAL TESTS:  5 times sit to stand: 27.84 sec                                                                                                                               TREATMENT DATE:   [facility] Adult PT Treatment:                                                DATE: 01/23/24 Pt seen for aquatic therapy today.  Treatment took place in water 3.5-4.75 ft in depth at the [facility] pool. Temp of water was 91.  Pt entered/exited the pool via stairs using alternating pattern with hand rail.  *Intro to setting *walking forward, back and side stepping in 3.6-4.0 ft with ue support of barbell->unsupported *L stretch x 3; hip hiking with stretch *figure four stretch holding to hand rails *round ligament stretch on bench  *decompression position with noodle wrapped  posteriorly then anteriorly across chest cycling *walking between exercises for recovery   Pt requires the buoyancy and hydrostatic pressure of water for support, and to offload joints by unweighting joint load by at least 50 % in navel deep water and by at least 75-80% in chest to neck deep water.  Viscosity of the water is needed for resistance of strengthening. Water current perturbations provides challenge to standing balance requiring increased core activation.     PATIENT EDUCATION:  Education details: PT eval findings, anticipated POC, initial HEP, and KT tape precautions   Person educated: Patient Education method: Explanation, Demonstration, Tactile cues, Verbal cues, and Handouts Education comprehension:  verbalized understanding and returned demonstration  HOME EXERCISE PROGRAM: Access Code: [email or web address] URL: [email or web address] Date: 01/22/2024 Prepared by: Mliss  Exercises - Seated Lateral Trunk Stretch on Swiss Ball  - 1 x daily - 7 x weekly - 3 reps - 5 breaths hold - Seated Hip Adduction Squeeze with Ball  - 2 x daily - 7 x  weekly - 2 sets - 10 reps - 5 hold - Seated Piriformis Stretch  - 3 x daily - 7 x weekly - 1 sets - 3 reps - 30-60 sec hold - Diaphragmatic Breathing in Supported Child's Pose with Pelvic Floor Relaxation  - 1 x daily - 3 x weekly - 2 sets - 10 reps - Quadruped Cat Cow  - 1 x daily - 3 x weekly - 2 sets - 10 reps  Pt education: KT tape and aquatic info   ASSESSMENT:  CLINICAL IMPRESSION: Good response from initial PT session with 8 NPRS reduction in pain and report of sleeping well last night. Pt demonstrates safety and independence in aquatic setting with therapist instructing from deck. She is slightly apprehensive initially in setting which reduces as session progresses.  Pt is directed through various movement patterns and trials in both sitting and standing positions. She tolerates and enjoys all stretching reporting positions are pain reducing and pressure relieving. Pt spends an extra 30 minutes moving throughout water further reducing stress and tension throughout.  Pain reduced submerged to 0/10.  Goals are ongoing.    Initial Impression Patient is a 28 y.o. female who was seen today for physical therapy evaluation and treatment for bil hip pain beginning 2-3 months ago. She is [redacted] weeks pregnant. She rates her pain at 10/10 and has increased pain with supine and sidelying, prolonged sitting, sit to stand transfers and walking. Her pain affects her ability to sleep, work and take care of her family. She also reports low back pain. She has functional hip ROM, but flexibility deficits and marked TTP in hip musculature. She will benefit from skilled  PT to address these deficits and those listed below. She would likely benefit from aquatic PT as well due to her intolerance to lying down.   OBJECTIVE IMPAIRMENTS: decreased activity tolerance, difficulty walking, decreased strength, increased muscle spasms, impaired flexibility, postural dysfunction, and pain.   ACTIVITY LIMITATIONS: carrying, lifting, bending, sitting, standing, squatting, sleeping, stairs, transfers, bed mobility, bathing, toileting, dressing, hygiene/grooming, locomotion level, and caring for others  PARTICIPATION LIMITATIONS: meal prep, cleaning, laundry, driving, shopping, community activity, and occupation  PERSONAL FACTORS: Time since onset of injury/illness/exacerbation and 1 comorbidity: pregnancy are also affecting patient's functional outcome.   REHAB POTENTIAL: Good  CLINICAL DECISION MAKING: Stable/uncomplicated  EVALUATION COMPLEXITY: Low   GOALS: Goals reviewed with patient? Yes  SHORT TERM GOALS: Target date: 02/19/2024  The patient will demonstrate knowledge of basic self care strategies and exercises to promote healing  including MFR  Baseline: Goal status: INITIAL  2.  The patient will report a 30% improvement in pain levels with functional activities which are currently difficult including sit to stand, turning over in bed and bending Baseline:  Goal status: INITIAL  3.  Ind with initial HEP Baseline:  Goal status: INITIAL    LONG TERM GOALS: Target date: 03/18/2024  The patient will be independent in a safe self progression of a home exercise program to promote further recovery of function   Baseline:  Goal status: INITIAL  2. The patient will report a 75% improvement in pain levels with functional activities which are currently difficult including bed mobility, sit to stand, sitting and walking. Baseline:  Goal status: INITIAL  3.   Improved PSFS score to 2 (average) indicating improved function with less pain Baseline:  Goal  status: INITIAL    PLAN:  PT FREQUENCY: 2x/week  PT DURATION: 8 weeks  PLANNED INTERVENTIONS: 02835- PT Re-evaluation,  97110-Therapeutic exercises, 97530- Therapeutic activity, [id number]- Neuromuscular re-education, [phone]- Self Care, 02859- Manual therapy, [phone]- Aquatic Therapy, [phone]- Ionotophoresis 4mg /ml Dexamethasone , 79439 (1-2 muscles), 20561 (3+ muscles)- Dry Needling, Patient/Family education, Taping, Joint mobilization, Spinal mobilization, Cryotherapy, and Moist heat  PLAN FOR NEXT SESSION: Review and progress HEP, Land: add ITB stretch, HS stretch, hip flexor stretch, ADDuctor stretch, core and LE strengthening, manual to B gluteals and lumbar.   Ronal Camanche [place]) Missi Mcmackin MPT 01/23/24 9:55 AM [facility] [address] [place], [place], [address] Phone: [phone]   Fax:  [phone]

## 2024-01-29 ENCOUNTER — Encounter (HOSPITAL_BASED_OUTPATIENT_CLINIC_OR_DEPARTMENT_OTHER): Payer: Self-pay | Admitting: Physical Therapy

## 2024-01-29 ENCOUNTER — Ambulatory Visit (HOSPITAL_BASED_OUTPATIENT_CLINIC_OR_DEPARTMENT_OTHER): Payer: Self-pay | Admitting: Physical Therapy

## 2024-01-29 ENCOUNTER — Ambulatory Visit: Admitting: Physical Therapy

## 2024-01-29 DIAGNOSIS — M5459 Other low back pain: Secondary | ICD-10-CM

## 2024-01-29 DIAGNOSIS — R252 Cramp and spasm: Secondary | ICD-10-CM

## 2024-01-29 DIAGNOSIS — M25552 Pain in left hip: Secondary | ICD-10-CM

## 2024-01-29 DIAGNOSIS — M25551 Pain in right hip: Secondary | ICD-10-CM | POA: Diagnosis not present

## 2024-01-29 NOTE — Therapy (Deleted)
 [facility] Out[facility] Rehabilitation at [facility] [address] [place], [place], [address] Phone: [phone]   Fax:  [phone]  Patient Details  Name: Kayleigh Broadwell MRN: [id number] Date of Birth: 11-Dec-1995 Referring Provider:  Armond Cape, MD  Encounter Date: 01/29/2024   Matilda Kohut, PT 01/29/2024, 11:16 AM  [facility] at [facility] [address] [place], [place], [address] Phone: [phone]   Fax:  [phone]

## 2024-01-29 NOTE — Therapy (Signed)
 OUTPATIENT PHYSICAL THERAPY LOWER EXTREMITY    Patient Name: Heather Noble MRN: [id number] DOB:[birth year], 28 y.o., female Today's Date: 01/29/2024  END OF SESSION:  PT End of Session - 01/29/24 1127     Visit Number 3    Date for PT Re-Evaluation 03/18/24    Authorization Type [employer] MCD no auth required    PT Start Time 1107    PT Stop Time 1145    PT Time Calculation (min) 38 min    Activity Tolerance Patient tolerated treatment well    Behavior During Therapy [facility] for tasks assessed/performed           Past Medical History:  Diagnosis Date   Anemia during pregnancy 11/03/2016   Headache    Pregnancy induced hypertension    Sickle cell trait (HCC) 11/03/2016   Past Surgical History:  Procedure Laterality Date   CESAREAN SECTION N/A 04/21/2017   Procedure: CESAREAN SECTION;  Surgeon: Herchel Gloris LABOR, MD;  [facility]: [facility];  Service: Obstetrics;  Laterality: N/A;   WISDOM TOOTH EXTRACTION  2015   Patient Active Problem List   Diagnosis Date Noted   History of severe pre-eclampsia 12/26/2023   History of postpartum hemorrhage, currently pregnant in third trimester 12/26/2023   Marginal insertion of umbilical cord affecting management of mother in third trimester 12/26/2023   Late prenatal care affecting pregnancy in third trimester 12/26/2023   Pregnancy 11/28/2023   Seizure-like activity (HCC) 06/05/2021   History of gestational hypertension 04/21/2017   Sickle cell trait (HCC) 11/03/2016   Anemia during pregnancy 11/03/2016    PCP: Paseda, Folashade R, FNP   REFERRING PROVIDER: Armond Cape, MD   REFERRING DIAG: M25.559 (ICD-10-CM) - Pain in unspecified hip   THERAPY DIAG:  Pain in right hip  Pain in left hip  Cramp and spasm  Other low back pain  Rationale for Evaluation and Treatment: Rehabilitation  ONSET DATE: 2 months ago  SUBJECTIVE:   SUBJECTIVE STATEMENT: My back popped when I got into bed the night I saw you and it  relieved all of the pressure in my back.  Has been better since.   Initial Subjective Hip pain became very sever 2 months ago. She has had LBP the entire pregnancy. Patient is [redacted] weeks pregnant. EDD 02/24/24. Third pregnancy. Has a 29 yo and 29 yo. Patient reports back pain when not pregnant,but not hip pain. Right hip usually hurts the most. Uses step stool to get into bed. Sometimes has to lift leg.   PERTINENT HISTORY: Csection 2018 and VBAC for second,  anemia and HTN during pregnancy PAIN:  Are you having pain? Yes: NPRS scale: 10/10 Pain location: front sides and back of hips Pain description: sharp pain Aggravating factors: sleeping  Relieving factors: pillow between knees or under knees helps some, LTR  PRECAUTIONS: Other: pregnant  RED FLAGS: None   WEIGHT BEARING RESTRICTIONS: No  FALLS:  Has patient fallen in last 6 months? Yes. 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  DIAGNOSTIC FINDINGS: none  PATIENT SURVEYS:  PSFS: THE PATIENT SPECIFIC FUNCTIONAL SCALE  Place score of 0-10 (0 = unable to perform activity and 10 = able to perform activity at the same level as before injury or problem)  Activity Date: 01/22/24    Sleeping 4    2.sit to stand transition 5    3.sitting/driving 3    4.      Total Score 4      Total Score = Sum of activity scores/number of activities  Minimally Detectable Change: 3 points (for single activity); 2 points (for average score)  [name]. The Patient Specific Functional Scale . Retrieved from  [email or web address]   COGNITION: Overall cognitive status: Within functional limits for tasks assessed     SENSATION: WFL  MUSCLE LENGTH: Piriformis B, L HS  POSTURE: increased lumbar lordosis  PALPATION: Marked pain in gluteals, SIJ  LOWER EXTREMITY ROM: WFL for tasks assessed  LOWER EXTREMITY MMT: Hips 4+/5, pain with hip flexion and ABD   FUNCTIONAL TESTS:  5 times sit to stand: 27.84 sec                                                                                                                               TREATMENT DATE:   [facility] Adult PT Treatment:                                                DATE: 01/29/24 Pt seen for aquatic therapy today.  Treatment took place in water 3.5-4.75 ft in depth at the [facility] pool. Temp of water was 91.  Pt entered/exited the pool via stairs using alternating pattern with hand rail.  *walking forward, back and side stepping in 3.6-4.0 ft unsupported *Yellow HB carry bil then unilaterally forward and back *L stretch x 3 with long hold; hip hiking with stretch *figure four stretch holding to hand rails *round ligament stretch on bench  *Glute stretch on bench *hip flex stretch on step *decompression position with noodle wrapped posteriorly then anteriorly across chest cycling *walking between exercises for recovery   Pt requires the buoyancy and hydrostatic pressure of water for support, and to offload joints by unweighting joint load by at least 50 % in navel deep water and by at least 75-80% in chest to neck deep water.  Viscosity of the water is needed for resistance of strengthening. Water current perturbations provides challenge to standing balance requiring increased core activation.     PATIENT EDUCATION:  Education details: PT eval findings, anticipated POC, initial HEP, and KT tape precautions   Person educated: Patient Education method: Explanation,  Demonstration, Tactile cues, Verbal cues, and Handouts Education comprehension: verbalized understanding and returned demonstration  HOME EXERCISE PROGRAM: Access Code: [email or web address] URL: [email or web address] Date: 01/22/2024 Prepared by: Mliss  Exercises - Seated Lateral Trunk Stretch on [employer]  - 1 x daily -  7 x weekly - 3 reps - 5 breaths hold - Seated Hip Adduction Squeeze with Ball  - 2 x daily - 7 x weekly - 2 sets - 10 reps - 5 hold - Seated Piriformis Stretch  - 3 x daily - 7 x weekly - 1 sets - 3 reps - 30-60 sec hold - Diaphragmatic Breathing in Supported Child's Pose with Pelvic Floor Relaxation  - 1 x daily - 3 x weekly - 2 sets - 10 reps - Quadruped Cat Cow  - 1 x daily - 3 x weekly - 2 sets - 10 reps  Pt education: KT tape and aquatic info   ASSESSMENT:  CLINICAL IMPRESSION: Excellent response to initial aquatic session with reduction in pain sensitivity that has lasted until this session.  She reports today low levels of Right SI and hip/round ligament pain.  None on left.  She tolerates fairly aggressive stretching adding glute to program along with others as noted above. Cycling on noodle further reduces discomfort and allows for overall body relaxation     Initial Impression Patient is a 28 y.o. female who was seen today for physical therapy evaluation and treatment for bil hip pain beginning 2-3 months ago. She is [redacted] weeks pregnant. She rates her pain at 10/10 and has increased pain with supine and sidelying, prolonged sitting, sit to stand transfers and walking. Her pain affects her ability to sleep, work and take care of her family. She also reports low back pain. She has functional hip ROM, but flexibility deficits and marked TTP in hip musculature. She will benefit from skilled PT to address these deficits and those listed below. She would likely benefit from aquatic PT as well due to her intolerance to lying down.   OBJECTIVE IMPAIRMENTS: decreased  activity tolerance, difficulty walking, decreased strength, increased muscle spasms, impaired flexibility, postural dysfunction, and pain.   ACTIVITY LIMITATIONS: carrying, lifting, bending, sitting, standing, squatting, sleeping, stairs, transfers, bed mobility, bathing, toileting, dressing, hygiene/grooming, locomotion level, and caring for others  PARTICIPATION LIMITATIONS: meal prep, cleaning, laundry, driving, shopping, community activity, and occupation  PERSONAL FACTORS: Time since onset of injury/illness/exacerbation and 1 comorbidity: pregnancy are also affecting patient's functional outcome.   REHAB POTENTIAL: Good  CLINICAL DECISION MAKING: Stable/uncomplicated  EVALUATION COMPLEXITY: Low   GOALS: Goals reviewed with patient? Yes  SHORT TERM GOALS: Target date: 02/19/2024  The patient will demonstrate knowledge of basic self care strategies and exercises to promote healing  including MFR  Baseline: Goal status: INITIAL  2.  The patient will report a 30% improvement in pain levels with functional activities which are currently difficult including sit to stand, turning over in bed and bending Baseline:  Goal status: INITIAL  3.  Ind with initial HEP Baseline:  Goal status: INITIAL    LONG TERM GOALS: Target date: 03/18/2024  The patient will be independent in a safe self progression of a home exercise program to promote further recovery of function   Baseline:  Goal status: INITIAL  2. The patient will report a 75% improvement in pain levels with functional activities which are currently difficult including bed mobility, sit to stand, sitting and walking. Baseline:  Goal status: INITIAL  3.   Improved PSFS score to 2 (average) indicating improved function with less pain Baseline:  Goal status: INITIAL    PLAN:  PT FREQUENCY: 2x/week  PT DURATION: 8 weeks  PLANNED INTERVENTIONS: 97164- PT Re-evaluation, 97110-Therapeutic exercises, 97530- Therapeutic  activity, [id number]- Neuromuscular re-education, 97535- Self Care,  02859- Manual therapy, [id number]- Aquatic Therapy, [phone]- Ionotophoresis 4mg /ml Dexamethasone , 20560 (1-2 muscles), 20561 (3+ muscles)- Dry Needling, Patient/Family education, Taping, Joint mobilization, Spinal mobilization, Cryotherapy, and Moist heat  PLAN FOR NEXT SESSION: Review and progress HEP, Land: add ITB stretch, HS stretch, hip flexor stretch, ADDuctor stretch, core and LE strengthening, manual to B gluteals and lumbar.   [id number] [place]) Johniece Hornbaker MPT 01/29/24 1:05 PM [facility] [address] [place], [place], [address] Phone: [phone]   Fax:  [phone]

## 2024-01-30 NOTE — Therapy (Incomplete)
 OUTPATIENT PHYSICAL THERAPY LOWER EXTREMITY    Patient Name: Heather Noble MRN: [id number] DOB:February 06, 1996, 28 y.o., female Today's Date: 01/30/2024  END OF SESSION:     Past Medical History:  Diagnosis Date   Anemia during pregnancy 11/03/2016   Headache    Pregnancy induced hypertension    Sickle cell trait (HCC) 11/03/2016   Past Surgical History:  Procedure Laterality Date   CESAREAN SECTION N/A 04/21/2017   Procedure: CESAREAN SECTION;  Surgeon: Herchel Gloris LABOR, MD;  Location: [facility];  Service: Obstetrics;  Laterality: N/A;   WISDOM TOOTH EXTRACTION  2015   Patient Active Problem List   Diagnosis Date Noted   History of severe pre-eclampsia 12/26/2023   History of postpartum hemorrhage, currently pregnant in third trimester 12/26/2023   Marginal insertion of umbilical cord affecting management of mother in third trimester 12/26/2023   Late prenatal care affecting pregnancy in third trimester 12/26/2023   Pregnancy 11/28/2023   Seizure-like activity (HCC) 06/05/2021   History of gestational hypertension 04/21/2017   Sickle cell trait (HCC) 11/03/2016   Anemia during pregnancy 11/03/2016    PCP: Paseda, Folashade R, FNP   REFERRING PROVIDER: Armond Cape, MD   REFERRING DIAG: M25.559 (ICD-10-CM) - Pain in unspecified hip   THERAPY DIAG:  No diagnosis found.  Rationale for Evaluation and Treatment: Rehabilitation  ONSET DATE: 2 months ago  SUBJECTIVE:   SUBJECTIVE STATEMENT: ***   Initial Subjective Hip pain became very sever 2 months ago. She has had LBP the entire pregnancy. Patient is [redacted] weeks pregnant. EDD 02/24/24. Third pregnancy. Has a 29 yo and 29 yo. Patient reports back pain when not pregnant,but not hip pain. Right hip usually hurts the most. Uses step stool to get into bed. Sometimes has to lift leg.   PERTINENT HISTORY: Csection 2018 and VBAC for second,  anemia and HTN during pregnancy PAIN:  Are you having pain? Yes:  NPRS scale: 10/10 Pain location: front sides and back of hips Pain description: sharp pain Aggravating factors: sleeping  Relieving factors: pillow between knees or under knees helps some, LTR  PRECAUTIONS: Other: pregnant  RED FLAGS: None   WEIGHT BEARING RESTRICTIONS: No  FALLS:  Has patient fallen in last 6 months? Yes. 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  DIAGNOSTIC FINDINGS: none  PATIENT SURVEYS:  PSFS: THE PATIENT SPECIFIC FUNCTIONAL SCALE  Place score of 0-10 (0 = unable to perform activity and 10 = able to perform activity at the same level as before injury or problem)  Activity Date: 01/22/24    Sleeping 4    2.sit to stand transition 5    3.sitting/driving 3    4.      Total Score 4      Total Score = Sum of activity scores/number of activities  Minimally Detectable Change: 3 points (for single activity); 2 points (for average score)  [name]. The Patient Specific Functional Scale . Retrieved from [email or web address]   COGNITION: Overall cognitive status: Within functional limits for tasks assessed     SENSATION: [facility]  MUSCLE LENGTH: Piriformis B, L HS  POSTURE: increased lumbar lordosis  PALPATION: Marked pain in gluteals, SIJ  LOWER EXTREMITY ROM: WFL for tasks assessed  LOWER EXTREMITY MMT: Hips 4+/5, pain with hip flexion and ABD   FUNCTIONAL TESTS:  5 times sit to stand: 27.84 sec                                                                                                                                TREATMENT DATE:   01/31/24 NuStep Level 5- 5 mins- PT present to discuss status Seated hip flexor stretch 2x30 sec B (chair) Cat Cow 2 x 10 [employer] x 10 with blue foam roll Hip IR with leg supported on foam roll 2 x 10 bilateral  Hooklying red loop 2x10 hip abduction 5 sec hold Hooklying red loop 2x10 hip flexion 2x10 bridges + ball squeeze 2 x 10 Hooklying alt hand and knee press with purple ball x 10 bilateral  Seated piriformis stretch 2 x 30 sec B      [facility] Adult PT Treatment:                                                DATE: 01/29/24 Pt seen for aquatic therapy today.  Treatment took place in water 3.5-4.75 ft in depth at the [facility] pool. Temp of water was 91.  Pt entered/exited the pool via stairs using alternating pattern with hand rail.  *walking forward, back and side stepping in 3.6-4.0 ft unsupported *Yellow HB carry bil then unilaterally forward and back *L stretch x 3 with long hold; hip hiking with stretch *figure four stretch holding to hand rails *round ligament stretch on bench  *Glute stretch on bench *hip flex stretch on step *decompression position with noodle wrapped posteriorly then anteriorly across chest cycling *walking between exercises for recovery   Pt requires the buoyancy and hydrostatic pressure of water for support, and to offload joints by unweighting joint load by at least 50 % in navel deep water and by at least 75-80% in chest to neck deep water.  Viscosity of the water is needed for resistance of strengthening. Water current perturbations provides challenge to standing balance requiring increased core activation.     PATIENT EDUCATION:  Education details: PT eval findings, anticipated POC, initial HEP, and KT tape precautions   Person educated: Patient Education method: Explanation, Demonstration, Tactile cues, Verbal cues, and Handouts Education  comprehension: verbalized understanding and returned demonstration  HOME EXERCISE PROGRAM: Access Code: [email or web address] URL: [email or web address] Date: 01/22/2024 Prepared by: Mliss  Exercises - Seated Lateral Trunk Stretch on Swiss Ball  - 1 x daily - 7 x weekly - 3 reps - 5 breaths hold - Seated Hip Adduction Squeeze with Ball  - 2 x daily - 7 x weekly -  2 sets - 10 reps - 5 hold - Seated Piriformis Stretch  - 3 x daily - 7 x weekly - 1 sets - 3 reps - 30-60 sec hold - Diaphragmatic Breathing in Supported Child's Pose with Pelvic Floor Relaxation  - 1 x daily - 3 x weekly - 2 sets - 10 reps - Quadruped Cat Cow  - 1 x daily - 3 x weekly - 2 sets - 10 reps  Pt education: KT tape and aquatic info   ASSESSMENT:  CLINICAL IMPRESSION: ***     Initial Impression Patient is a 28 y.o. female who was seen today for physical therapy evaluation and treatment for bil hip pain beginning 2-3 months ago. She is [redacted] weeks pregnant. She rates her pain at 10/10 and has increased pain with supine and sidelying, prolonged sitting, sit to stand transfers and walking. Her pain affects her ability to sleep, work and take care of her family. She also reports low back pain. She has functional hip ROM, but flexibility deficits and marked TTP in hip musculature. She will benefit from skilled PT to address these deficits and those listed below. She would likely benefit from aquatic PT as well due to her intolerance to lying down.   OBJECTIVE IMPAIRMENTS: decreased activity tolerance, difficulty walking, decreased strength, increased muscle spasms, impaired flexibility, postural dysfunction, and pain.   ACTIVITY LIMITATIONS: carrying, lifting, bending, sitting, standing, squatting, sleeping, stairs, transfers, bed mobility, bathing, toileting, dressing, hygiene/grooming, locomotion level, and caring for others  PARTICIPATION LIMITATIONS: meal prep, cleaning, laundry, driving, shopping, community activity,  and occupation  PERSONAL FACTORS: Time since onset of injury/illness/exacerbation and 1 comorbidity: pregnancy are also affecting patient's functional outcome.   REHAB POTENTIAL: Good  CLINICAL DECISION MAKING: Stable/uncomplicated  EVALUATION COMPLEXITY: Low   GOALS: Goals reviewed with patient? Yes  SHORT TERM GOALS: Target date: 02/19/2024  The patient will demonstrate knowledge of basic self care strategies and exercises to promote healing  including MFR  Baseline: Goal status: INITIAL  2.  The patient will report a 30% improvement in pain levels with functional activities which are currently difficult including sit to stand, turning over in bed and bending Baseline:  Goal status: INITIAL  3.  Ind with initial HEP Baseline:  Goal status: INITIAL    LONG TERM GOALS: Target date: 03/18/2024  The patient will be independent in a safe self progression of a home exercise program to promote further recovery of function   Baseline:  Goal status: INITIAL  2. The patient will report a 75% improvement in pain levels with functional activities which are currently difficult including bed mobility, sit to stand, sitting and walking. Baseline:  Goal status: INITIAL  3.   Improved PSFS score to 2 (average) indicating improved function with less pain Baseline:  Goal status: INITIAL    PLAN:  PT FREQUENCY: 2x/week  PT DURATION: 8 weeks  PLANNED INTERVENTIONS: 97164- PT Re-evaluation, 97110-Therapeutic exercises, 97530- Therapeutic activity, 97112- Neuromuscular re-education, 97535- Self Care, 02859- Manual therapy, [phone]- Aquatic Therapy, [id number]- Ionotophoresis 4mg /ml Dexamethasone , 79439 (1-2 muscles), 20561 (3+ muscles)- Dry Needling, Patient/Family education, Taping, Joint mobilization, Spinal mobilization, Cryotherapy, and Moist heat  PLAN FOR NEXT SESSION: Review and progress HEP, Land: add ITB stretch, HS stretch, hip flexor stretch, ADDuctor stretch, core and LE  strengthening, manual to B gluteals and lumbar.   Mliss Cummins, PT 01/30/24 6:02 PM

## 2024-01-31 ENCOUNTER — Ambulatory Visit: Admitting: Physical Therapy

## 2024-02-04 NOTE — Therapy (Signed)
 OUTPATIENT PHYSICAL THERAPY LOWER EXTREMITY    Patient Name: Heather Noble MRN: [id number] DOB:[birth year], 28 y.o., female Today's Date: 02/05/2024  END OF SESSION:  PT End of Session - 02/05/24 0907     Visit Number 4    Date for PT Re-Evaluation 03/18/24    Authorization Type UHC MCD no auth required    PT Start Time 0906    PT Stop Time 0934    PT Time Calculation (min) 28 min    Activity Tolerance Patient tolerated treatment well    Behavior During Therapy [facility] for tasks assessed/performed            Past Medical History:  Diagnosis Date   Anemia during pregnancy 11/03/2016   Headache    Pregnancy induced hypertension    Sickle cell trait (HCC) 11/03/2016   Past Surgical History:  Procedure Laterality Date   CESAREAN SECTION N/A 04/21/2017   Procedure: CESAREAN SECTION;  Surgeon: Herchel Gloris LABOR, MD;  Location: [facility];  Service: Obstetrics;  Laterality: N/A;   WISDOM TOOTH EXTRACTION  2015   Patient Active Problem List   Diagnosis Date Noted   History of severe pre-eclampsia 12/26/2023   History of postpartum hemorrhage, currently pregnant in third trimester 12/26/2023   Marginal insertion of umbilical cord affecting management of mother in third trimester 12/26/2023   Late prenatal care affecting pregnancy in third trimester 12/26/2023   Pregnancy 11/28/2023   Seizure-like activity (HCC) 06/05/2021   History of gestational hypertension 04/21/2017   Sickle cell trait (HCC) 11/03/2016   Anemia during pregnancy 11/03/2016    PCP: Paseda, Folashade R, FNP   REFERRING PROVIDER: Armond Cape, MD   REFERRING DIAG: M25.559 (ICD-10-CM) - Pain in unspecified hip   THERAPY DIAG:  Pain in right hip  Pain in left hip  Cramp and spasm  Other low back pain  Rationale for Evaluation and Treatment: Rehabilitation  ONSET DATE: 2 months ago  SUBJECTIVE:   SUBJECTIVE STATEMENT: Pain is much better. The baby is dropping more and more. Pain  was really bad last night.    Initial Subjective Hip pain became very sever 2 months ago. She has had LBP the entire pregnancy. Patient is [redacted] weeks pregnant. EDD 02/24/24. Third pregnancy. Has a 30 yo and 29 yo. Patient reports back pain when not pregnant,but not hip pain. Right hip usually hurts the most. Uses step stool to get into bed. Sometimes has to lift leg.   PERTINENT HISTORY: Csection 2018 and VBAC for second,  anemia and HTN during pregnancy PAIN:  Are you having pain? Yes: NPRS scale: 0/10 Pain location: front sides and back of hips Pain description: sharp pain Aggravating factors: sleeping  Relieving factors: pillow between knees or under knees helps some, LTR  PRECAUTIONS: Other: pregnant  RED FLAGS: None   WEIGHT BEARING RESTRICTIONS: No  FALLS:  Has patient fallen in last 6 months? Yes. 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  DIAGNOSTIC FINDINGS: none  PATIENT SURVEYS:  PSFS: THE PATIENT SPECIFIC FUNCTIONAL SCALE  Place score of 0-10 (0 = unable to  perform activity and 10 = able to perform activity at the same level as before injury or problem)  Activity Date: 01/22/24    Sleeping 4    2.sit to stand transition 5    3.sitting/driving 3    4.      Total Score 4      Total Score = Sum of activity scores/number of activities  Minimally Detectable Change: 3 points (for single activity); 2 points (for average score)  [name]. The Patient Specific Functional Scale . Retrieved from  [email or web address]   COGNITION: Overall cognitive status: Within functional limits for tasks assessed     SENSATION: WFL  MUSCLE LENGTH: Piriformis B, L HS  POSTURE: increased lumbar lordosis  PALPATION: Marked pain in gluteals, SIJ  LOWER EXTREMITY ROM: WFL for tasks assessed  LOWER EXTREMITY MMT: Hips 4+/5, pain with hip flexion and ABD   FUNCTIONAL TESTS:  5 times sit to stand: 27.84 sec                                                                                                                               TREATMENT DATE:   02/05/24 Seated hip flexor stretch 2x20 sec B (chair) Seated hip ADD stretch 2x 20 sec B (chair) Open Books x 5 with blue foam roll Hip IR with leg supported on foam roll 2 x 10 bilateral (first 10 felt good, 2nd set not comfortable) Hooklying Clam x 10 - some cramping R SL hip ABD with hips at 45 deg flexion x 10 B Hooklying red loop clam x 20      [facility] Adult PT Treatment:                                                DATE: 01/29/24 Pt seen for aquatic therapy today.  Treatment took place in water 3.5-4.75 ft in depth at the [facility] pool. Temp of water was 91.  Pt entered/exited the pool via stairs using alternating pattern with hand rail.  *walking forward, back and side stepping in 3.6-4.0 ft unsupported *Yellow HB carry bil then unilaterally forward and back *L stretch x 3 with long hold; hip hiking with stretch *figure four stretch holding to hand rails *round ligament stretch on bench  *Glute stretch on bench *hip flex stretch on step *decompression position with noodle wrapped posteriorly then anteriorly across chest cycling *walking between exercises for recovery   Pt requires the buoyancy and hydrostatic pressure of water for support, and to offload joints by unweighting joint load by at least 50 % in navel deep water and by at least 75-80% in chest to neck deep  water.  Viscosity of the water is needed for resistance of strengthening. Water current perturbations provides challenge to standing balance requiring increased core activation.  PATIENT EDUCATION:  Education details: PT eval findings, anticipated POC, initial HEP, and KT tape precautions   Person educated: Patient Education method: Explanation, Demonstration, Tactile cues, Verbal cues, and Handouts Education comprehension: verbalized understanding and returned demonstration  HOME EXERCISE PROGRAM: Access Code: [email or web address] URL: [email or web address] Date: 02/05/2024 Prepared by: Mliss  Exercises - Seated Lateral Trunk Stretch on Swiss Ball  - 1 x daily - 7 x weekly - 3 reps - 5 breaths hold - Seated Hip Adduction Squeeze with Ball  - 2 x daily - 7 x weekly - 2 sets - 10 reps - 5 hold - Seated Piriformis Stretch  - 3 x daily - 7 x weekly - 1 sets - 3 reps - 30-60 sec hold - Diaphragmatic Breathing in Supported Child's Pose with Pelvic Floor Relaxation  - 1 x daily - 3 x weekly - 2 sets - 10 reps - Quadruped Cat Cow  - 1 x daily - 3 x weekly - 2 sets - 10 reps - Seated Hip Flexor Stretch  - 2 x daily - 7 x weekly - 1 sets - 3 reps - 30-60 sec hold - Seated Hip Adductor Stretch  - 1 x daily - 3 x weekly - 2 sets - 10 reps - Sidelying Open Book Thoracic Rotation with Knee on Foam Roll  - 1 x daily - 3 x weekly - 2 sets - 10 reps - Sidelying Hip Abduction  - 1 x daily - 3 x weekly - 2 sets - 10 reps - Hooklying Clamshell with Resistance  - 1 x daily - 3 x weekly - 2 sets - 10 reps - Sidelying Reverse Clamshell  - 1 x daily - 3 x weekly - 2 sets - 10 reps  Pt education: KT tape and aquatic info   ASSESSMENT:  CLINICAL IMPRESSION: Paient 17 min late today.  Patient reports she is not longer in constant pain. She has decreased intensity in pain and decreased frequency as well. She especially feels really good the day of PT. We added in some new stretches today for hip flexors  and adductors which she liked as well as open books. She had some discomfort in the R hip with IR during the second set, but initially this felt good as well. R sided clams caused  a feeling of spasm on the R when done in SL, but hooklying clams with resistance were fine. HEP updated and pt precautioned to stop any that caused increased pain.     Initial Impression Patient is a 28 y.o. female who was seen today for physical therapy evaluation and treatment for bil hip pain beginning 2-3 months ago. She is [redacted] weeks pregnant. She rates her pain at 10/10 and has increased pain with supine and sidelying, prolonged sitting, sit to stand transfers and walking. Her pain affects her ability to sleep, work and take care of her family. She also reports low back pain. She has functional hip ROM, but flexibility deficits and marked TTP in hip musculature. She will benefit from skilled PT to address these deficits and those listed below. She would likely benefit from aquatic PT as well due to her intolerance to lying down.   OBJECTIVE IMPAIRMENTS: decreased activity tolerance, difficulty walking, decreased strength, increased muscle spasms, impaired flexibility, postural dysfunction, and pain.   ACTIVITY LIMITATIONS: carrying, lifting, bending, sitting, standing, squatting, sleeping, stairs, transfers, bed mobility, bathing, toileting, dressing, hygiene/grooming, locomotion level, and caring for others  PARTICIPATION LIMITATIONS: meal prep, cleaning, laundry, driving, shopping,  community activity, and occupation  PERSONAL FACTORS: Time since onset of injury/illness/exacerbation and 1 comorbidity: pregnancy are also affecting patient's functional outcome.   REHAB POTENTIAL: Good  CLINICAL DECISION MAKING: Stable/uncomplicated  EVALUATION COMPLEXITY: Low   GOALS: Goals reviewed with patient? Yes  SHORT TERM GOALS: Target date: 02/19/2024  The patient will demonstrate knowledge of basic self care  strategies and exercises to promote healing  including MFR  Baseline: Goal status: INITIAL  2.  The patient will report a 30% improvement in pain levels with functional activities which are currently difficult including sit to stand, turning over in bed and bending Baseline:  Goal status: INITIAL  3.  Ind with initial HEP Baseline:  Goal status: INITIAL    LONG TERM GOALS: Target date: 03/18/2024  The patient will be independent in a safe self progression of a home exercise program to promote further recovery of function   Baseline:  Goal status: INITIAL  2. The patient will report a 75% improvement in pain levels with functional activities which are currently difficult including bed mobility, sit to stand, sitting and walking. Baseline:  Goal status: INITIAL  3.   Improved PSFS score to 2 (average) indicating improved function with less pain Baseline:  Goal status: INITIAL    PLAN:  PT FREQUENCY: 2x/week  PT DURATION: 8 weeks  PLANNED INTERVENTIONS: 97164- PT Re-evaluation, 97110-Therapeutic exercises, 97530- Therapeutic activity, 97112- Neuromuscular re-education, 97535- Self Care, 02859- Manual therapy, [id number]- Aquatic Therapy, [phone]- Ionotophoresis 4mg /ml Dexamethasone , 79439 (1-2 muscles), 20561 (3+ muscles)- Dry Needling, Patient/Family education, Taping, Joint mobilization, Spinal mobilization, Cryotherapy, and Moist heat  PLAN FOR NEXT SESSION: Review and progress HEP, Land: add ITB stretch, HS stretch, hip flexor stretch, ADDuctor stretch, core and LE strengthening, manual to B gluteals and lumbar.   Mliss Cummins, PT 02/05/24 11:31 AM

## 2024-02-05 ENCOUNTER — Encounter: Payer: Self-pay | Admitting: Physical Therapy

## 2024-02-05 ENCOUNTER — Ambulatory Visit: Attending: Obstetrics and Gynecology | Admitting: Physical Therapy

## 2024-02-05 DIAGNOSIS — M5459 Other low back pain: Secondary | ICD-10-CM | POA: Insufficient documentation

## 2024-02-05 DIAGNOSIS — R252 Cramp and spasm: Secondary | ICD-10-CM | POA: Insufficient documentation

## 2024-02-05 DIAGNOSIS — M25551 Pain in right hip: Secondary | ICD-10-CM | POA: Diagnosis present

## 2024-02-05 DIAGNOSIS — M25552 Pain in left hip: Secondary | ICD-10-CM | POA: Insufficient documentation

## 2024-02-07 ENCOUNTER — Encounter: Admitting: Physical Therapy

## 2024-02-08 ENCOUNTER — Encounter (HOSPITAL_BASED_OUTPATIENT_CLINIC_OR_DEPARTMENT_OTHER): Payer: Self-pay | Admitting: Physical Therapy

## 2024-02-08 ENCOUNTER — Ambulatory Visit (HOSPITAL_BASED_OUTPATIENT_CLINIC_OR_DEPARTMENT_OTHER): Payer: Self-pay | Attending: Obstetrics and Gynecology | Admitting: Physical Therapy

## 2024-02-08 DIAGNOSIS — M25552 Pain in left hip: Secondary | ICD-10-CM | POA: Insufficient documentation

## 2024-02-08 DIAGNOSIS — M5459 Other low back pain: Secondary | ICD-10-CM | POA: Insufficient documentation

## 2024-02-08 DIAGNOSIS — R252 Cramp and spasm: Secondary | ICD-10-CM | POA: Diagnosis present

## 2024-02-08 DIAGNOSIS — M25551 Pain in right hip: Secondary | ICD-10-CM | POA: Diagnosis present

## 2024-02-08 NOTE — Therapy (Signed)
 OUTPATIENT PHYSICAL THERAPY LOWER EXTREMITY    Patient Name: Heather Noble MRN: [id number] DOB:10-Aug-1995, 28 y.o., female Today's Date: 02/08/2024  END OF SESSION:  PT End of Session - 02/08/24 0907     Visit Number 5    Date for PT Re-Evaluation 03/18/24    Authorization Type UHC MCD no auth required    PT Start Time 0909   arrives 24 mins late   PT Stop Time 0932    PT Time Calculation (min) 23 min    Activity Tolerance Patient tolerated treatment well    Behavior During Therapy [facility] for tasks assessed/performed            Past Medical History:  Diagnosis Date   Anemia during pregnancy 11/03/2016   Headache    Pregnancy induced hypertension    Sickle cell trait (HCC) 11/03/2016   Past Surgical History:  Procedure Laterality Date   CESAREAN SECTION N/A 04/21/2017   Procedure: CESAREAN SECTION;  Surgeon: Herchel Gloris LABOR, MD;  Location: [facility];  Service: Obstetrics;  Laterality: N/A;   WISDOM TOOTH EXTRACTION  2015   Patient Active Problem List   Diagnosis Date Noted   History of severe pre-eclampsia 12/26/2023   History of postpartum hemorrhage, currently pregnant in third trimester 12/26/2023   Marginal insertion of umbilical cord affecting management of mother in third trimester 12/26/2023   Late prenatal care affecting pregnancy in third trimester 12/26/2023   Pregnancy 11/28/2023   Seizure-like activity (HCC) 06/05/2021   History of gestational hypertension 04/21/2017   Sickle cell trait (HCC) 11/03/2016   Anemia during pregnancy 11/03/2016    PCP: Paseda, Folashade R, FNP   REFERRING PROVIDER: Armond Cape, MD   REFERRING DIAG: M25.559 (ICD-10-CM) - Pain in unspecified hip   THERAPY DIAG:  Pain in right hip  Pain in left hip  Cramp and spasm  Other low back pain  Rationale for Evaluation and Treatment: Rehabilitation  ONSET DATE: 2 months ago  SUBJECTIVE:   SUBJECTIVE STATEMENT: Baby dropping   Initial  Subjective Hip pain became very sever 2 months ago. She has had LBP the entire pregnancy. Patient is [redacted] weeks pregnant. EDD 02/24/24. Third pregnancy. Has a 29 yo and 29 yo. Patient reports back pain when not pregnant,but not hip pain. Right hip usually hurts the most. Uses step stool to get into bed. Sometimes has to lift leg.   PERTINENT HISTORY: Csection 2018 and VBAC for second,  anemia and HTN during pregnancy PAIN:  Are you having pain? Yes: NPRS scale: 0/10 Pain location: front sides and back of hips Pain description: sharp pain Aggravating factors: sleeping  Relieving factors: pillow between knees or under knees helps some, LTR  PRECAUTIONS: Other: pregnant  RED FLAGS: None   WEIGHT BEARING RESTRICTIONS: No  FALLS:  Has patient fallen in last 6 months? Yes. 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  DIAGNOSTIC FINDINGS: none  PATIENT SURVEYS:  PSFS: THE PATIENT SPECIFIC FUNCTIONAL SCALE  Place score of 0-10 (0 = unable to perform activity and 10 = able to perform activity at the  same level as before injury or problem)  Activity Date: 01/22/24    Sleeping 4    2.sit to stand transition 5    3.sitting/driving 3    4.      Total Score 4      Total Score = Sum of activity scores/number of activities  Minimally Detectable Change: 3 points (for single activity); 2 points (for average score)  [name]. The Patient Specific Functional Scale . Retrieved from [email or web address]    COGNITION: Overall cognitive status: Within functional limits for tasks assessed     SENSATION: WFL  MUSCLE LENGTH: Piriformis B, L HS  POSTURE: increased lumbar lordosis  PALPATION: Marked pain in gluteals, SIJ  LOWER EXTREMITY ROM: WFL for tasks assessed  LOWER EXTREMITY MMT: Hips 4+/5, pain with hip flexion and ABD   FUNCTIONAL TESTS:  5 times sit to stand: 27.84 sec                                                                                                                               TREATMENT DATE:   [facility] Adult PT Treatment:                                                DATE: 02/08/24 Pt seen for aquatic therapy today.  Treatment took place in water 3.5-4.75 ft in depth at the [facility] pool. Temp of water was 91.  Pt entered/exited the pool via stairs using alternating pattern with hand rail.  *walking forward, back and side stepping in 3.6-4.0 ft unsupported *L stretch x 3 with long hold; hip hiking with stretch *round ligament stretch on bench  *Glute stretch on bench *hip flex stretch on step *prone suspension using yellow noodle; knees to chest x 8 *decompression position with noodle wrapped posteriorly then anteriorly across chest cycling *walking between exercises for recovery   Pt requires the buoyancy and hydrostatic pressure of water for support, and to offload joints by unweighting joint load by at least 50 % in navel deep water and by at least 75-80% in chest to neck deep water.  Viscosity of the water is needed for resistance of strengthening. Water current perturbations provides challenge to standing balance requiring increased core activation.  02/05/24 Seated hip flexor stretch 2x20 sec B (chair) Seated hip ADD stretch 2x 20 sec B (chair) Open Books x 5 with blue foam roll Hip IR with leg supported on foam roll 2 x 10 bilateral (first 10 felt good, 2nd set not comfortable) Hooklying Clam x 10 - some cramping R SL hip ABD with hips  at 45 deg flexion x 10 B Hooklying red loop clam x 20      [facility] Adult PT Treatment:  DATE: 01/29/24 Pt seen for aquatic therapy today.  Treatment took place in water 3.5-4.75 ft in depth at the [facility] pool. Temp of water was 91.  Pt entered/exited the pool via stairs using alternating pattern with hand rail.  *walking forward, back and side stepping in 3.6-4.0 ft unsupported *Yellow HB carry bil then unilaterally forward and back *L stretch x 3 with long hold; hip hiking with stretch *figure four stretch holding to hand rails *round ligament stretch on bench  *Glute stretch on bench *hip flex stretch on step *decompression position with noodle wrapped posteriorly then anteriorly across chest cycling *walking between exercises for recovery   Pt requires the buoyancy and hydrostatic pressure of water for support, and to offload joints by unweighting joint load by at least 50 % in navel deep water and by at least 75-80% in chest to neck deep water.  Viscosity of the water is needed for resistance of strengthening. Water current perturbations provides challenge to standing balance requiring increased core activation.     PATIENT EDUCATION:  Education details: PT eval findings, anticipated POC, initial HEP, and KT tape precautions   Person educated: Patient Education method: Explanation, Demonstration, Tactile cues, Verbal cues, and Handouts Education comprehension: verbalized understanding and returned demonstration  HOME EXERCISE PROGRAM: Access Code: [email or web address] URL: [email or web address] Date: 02/05/2024 Prepared by: Mliss  Exercises - Seated Lateral Trunk Stretch on Swiss Ball  - 1 x daily - 7 x weekly - 3 reps - 5 breaths hold - Seated Hip Adduction Squeeze with Ball  - 2 x daily - 7 x weekly - 2 sets - 10 reps - 5 hold - Seated Piriformis Stretch  - 3 x daily - 7 x weekly - 1 sets - 3 reps - 30-60 sec  hold - Diaphragmatic Breathing in Supported Child's Pose with Pelvic Floor Relaxation  - 1 x daily - 3 x weekly - 2 sets - 10 reps - Quadruped Cat Cow  - 1 x daily - 3 x weekly - 2 sets - 10 reps - Seated Hip Flexor Stretch  - 2 x daily - 7 x weekly - 1 sets - 3 reps - 30-60 sec hold - Seated Hip Adductor Stretch  - 1 x daily - 3 x weekly - 2 sets - 10 reps - Sidelying Open Book Thoracic Rotation with Knee on Foam Roll  - 1 x daily - 3 x weekly - 2 sets - 10 reps - Sidelying Hip Abduction  - 1 x daily - 3 x weekly - 2 sets - 10 reps - Hooklying Clamshell with Resistance  - 1 x daily - 3 x weekly - 2 sets - 10 reps - Sidelying Reverse Clamshell  - 1 x daily - 3 x weekly - 2 sets - 10 reps  Pt education: KT tape and aquatic info   ASSESSMENT:  CLINICAL IMPRESSION: Patient 24 mins late for appt limiting time spent. She reports very mild pain about hips/round ligament after last land visit which resolved quickly.  She is able to complete all ADL's and functional mobility without limitation to pain. No pain reports today.  Good toleration to all stretching and strengthening exercise. She does stay post session for cycling on noodle working on endurance/toleration to activity. Goals ongoing. STG #2 Met      Initial Impression Patient is a 28 y.o. female who was seen today for physical therapy evaluation and treatment for bil hip pain beginning 2-3 months ago. She is [redacted] weeks pregnant. She  rates her pain at 10/10 and has increased pain with supine and sidelying, prolonged sitting, sit to stand transfers and walking. Her pain affects her ability to sleep, work and take care of her family. She also reports low back pain. She has functional hip ROM, but flexibility deficits and marked TTP in hip musculature. She will benefit from skilled PT to address these deficits and those listed below. She would likely benefit from aquatic PT as well due to her intolerance to lying down.   OBJECTIVE IMPAIRMENTS:  decreased activity tolerance, difficulty walking, decreased strength, increased muscle spasms, impaired flexibility, postural dysfunction, and pain.   ACTIVITY LIMITATIONS: carrying, lifting, bending, sitting, standing, squatting, sleeping, stairs, transfers, bed mobility, bathing, toileting, dressing, hygiene/grooming, locomotion level, and caring for others  PARTICIPATION LIMITATIONS: meal prep, cleaning, laundry, driving, shopping, community activity, and occupation  PERSONAL FACTORS: Time since onset of injury/illness/exacerbation and 1 comorbidity: pregnancy are also affecting patient's functional outcome.   REHAB POTENTIAL: Good  CLINICAL DECISION MAKING: Stable/uncomplicated  EVALUATION COMPLEXITY: Low   GOALS: Goals reviewed with patient? Yes  SHORT TERM GOALS: Target date: 02/19/2024  The patient will demonstrate knowledge of basic self care strategies and exercises to promote healing  including MFR  Baseline: Goal status: INITIAL  2.  The patient will report a 30% improvement in pain levels with functional activities which are currently difficult including sit to stand, turning over in bed and bending Baseline:  Goal status: Met 02/08/24  3.  Ind with initial HEP Baseline:  Goal status: INITIAL    LONG TERM GOALS: Target date: 03/18/2024  The patient will be independent in a safe self progression of a home exercise program to promote further recovery of function   Baseline:  Goal status: INITIAL  2. The patient will report a 75% improvement in pain levels with functional activities which are currently difficult including bed mobility, sit to stand, sitting and walking. Baseline:  Goal status: INITIAL  3.   Improved PSFS score to 2 (average) indicating improved function with less pain Baseline:  Goal status: INITIAL    PLAN:  PT FREQUENCY: 2x/week  PT DURATION: 8 weeks  PLANNED INTERVENTIONS: 97164- PT Re-evaluation, 97110-Therapeutic exercises, 97530-  Therapeutic activity, 97112- Neuromuscular re-education, 97535- Self Care, 02859- Manual therapy, [phone]- Aquatic Therapy, [phone]- Ionotophoresis 4mg /ml Dexamethasone , 79439 (1-2 muscles), 20561 (3+ muscles)- Dry Needling, Patient/Family education, Taping, Joint mobilization, Spinal mobilization, Cryotherapy, and Moist heat  PLAN FOR NEXT SESSION: Review and progress HEP, Land: add ITB stretch, HS stretch, hip flexor stretch, ADDuctor stretch, core and LE strengthening, manual to B gluteals and lumbar.   Ronal Ivanhoe) Melvin Marmo MPT 02/08/24 12:43 PM [facility] [address] [place], [place], [address] Phone: [phone]   Fax:  [phone]

## 2024-02-12 ENCOUNTER — Encounter

## 2024-02-14 ENCOUNTER — Ambulatory Visit (HOSPITAL_BASED_OUTPATIENT_CLINIC_OR_DEPARTMENT_OTHER): Payer: Self-pay | Admitting: Physical Therapy

## 2024-02-14 ENCOUNTER — Encounter: Admitting: Physical Therapy

## 2024-02-14 ENCOUNTER — Encounter (HOSPITAL_BASED_OUTPATIENT_CLINIC_OR_DEPARTMENT_OTHER): Payer: Self-pay | Admitting: Physical Therapy

## 2024-02-14 DIAGNOSIS — R252 Cramp and spasm: Secondary | ICD-10-CM

## 2024-02-14 DIAGNOSIS — M5459 Other low back pain: Secondary | ICD-10-CM

## 2024-02-14 DIAGNOSIS — M25551 Pain in right hip: Secondary | ICD-10-CM | POA: Diagnosis not present

## 2024-02-14 DIAGNOSIS — M25552 Pain in left hip: Secondary | ICD-10-CM

## 2024-02-14 LAB — OB RESULTS CONSOLE GBS: GBS: NEGATIVE

## 2024-02-14 NOTE — Therapy (Addendum)
 OUTPATIENT PHYSICAL THERAPY LOWER EXTREMITY    Patient Name: Heather Noble MRN: [id number] DOB:[birth year], 28 y.o., female Today's Date: 02/14/2024  END OF SESSION:  PT End of Session - 02/14/24 1218     Visit Number 6    Date for PT Re-Evaluation 03/18/24    Authorization Type [employer] MCD no auth required    PT Start Time 1200   pt arrives late   PT Stop Time 1230    PT Time Calculation (min) 30 min    Activity Tolerance Patient tolerated treatment well    Behavior During Therapy [facility] for tasks assessed/performed            Past Medical History:  Diagnosis Date   Anemia during pregnancy 11/03/2016   Headache    Pregnancy induced hypertension    Sickle cell trait (HCC) 11/03/2016   Past Surgical History:  Procedure Laterality Date   CESAREAN SECTION N/A 04/21/2017   Procedure: CESAREAN SECTION;  Surgeon: Herchel Gloris LABOR, MD;  Location: [facility];  Service: Obstetrics;  Laterality: N/A;   WISDOM TOOTH EXTRACTION  2015   Patient Active Problem List   Diagnosis Date Noted   History of severe pre-eclampsia 12/26/2023   History of postpartum hemorrhage, currently pregnant in third trimester 12/26/2023   Marginal insertion of umbilical cord affecting management of mother in third trimester 12/26/2023   Late prenatal care affecting pregnancy in third trimester 12/26/2023   Pregnancy 11/28/2023   Seizure-like activity (HCC) 06/05/2021   History of gestational hypertension 04/21/2017   Sickle cell trait (HCC) 11/03/2016   Anemia during pregnancy 11/03/2016    PCP: Paseda, Folashade R, FNP   REFERRING PROVIDER: Armond Cape, MD   REFERRING DIAG: M25.559 (ICD-10-CM) - Pain in unspecified hip   THERAPY DIAG:  Pain in right hip  Pain in left hip  Cramp and spasm  Other low back pain  Rationale for Evaluation and Treatment: Rehabilitation  ONSET DATE: 2 months ago  SUBJECTIVE:   SUBJECTIVE STATEMENT: My hip is good just my back hurts mostly at  night   Initial Subjective Hip pain became very sever 2 months ago. She has had LBP the entire pregnancy. Patient is [redacted] weeks pregnant. EDD 02/24/24. Third pregnancy. Has a 29 yo and 29 yo. Patient reports back pain when not pregnant,but not hip pain. Right hip usually hurts the most. Uses step stool to get into bed. Sometimes has to lift leg.   PERTINENT HISTORY: Csection 2018 and VBAC for second,  anemia and HTN during pregnancy PAIN:  Are you having pain? Yes: NPRS scale: 0/10 Pain location: front sides and back of hips Pain description: sharp pain Aggravating factors: sleeping  Relieving factors: pillow between knees or under knees helps some, LTR  PRECAUTIONS: Other: pregnant  RED FLAGS: None   WEIGHT BEARING RESTRICTIONS: No  FALLS:  Has patient fallen in last 6 months? Yes. 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  DIAGNOSTIC FINDINGS: none  PATIENT SURVEYS:  PSFS: THE PATIENT SPECIFIC FUNCTIONAL SCALE  Place score of 0-10 (0 = unable to perform activity and  10 = able to perform activity at the same level as before injury or problem)  Activity Date: 01/22/24    Sleeping 4    2.sit to stand transition 5    3.sitting/driving 3    4.      Total Score 4      Total Score = Sum of activity scores/number of activities  Minimally Detectable Change: 3 points (for single activity); 2 points (for average score)  [name]. The Patient Specific Functional Scale . Retrieved from  [email or web address]   COGNITION: Overall cognitive status: Within functional limits for tasks assessed     SENSATION: WFL  MUSCLE LENGTH: Piriformis B, L HS  POSTURE: increased lumbar lordosis  PALPATION: Marked pain in gluteals, SIJ  LOWER EXTREMITY ROM: WFL for tasks assessed  LOWER EXTREMITY MMT: Hips 4+/5, pain with hip flexion and ABD   FUNCTIONAL TESTS:  5 times sit to stand: 27.84 sec                                                                                                                               TREATMENT DATE:   [facility] Adult PT Treatment:                                                DATE: 02/14/24 Pt seen for aquatic therapy today.  Treatment took place in water 3.5-4.75 ft in depth at the [facility] pool. Temp of water was 91.  Pt entered/exited the pool via stairs using alternating pattern with hand rail.  *walking forward, back and side stepping in 3.6-4.0 ft unsupported *prone suspension using yellow noodle; knees to chest x 8 *L stretch x 3 with long hold; hip hiking with stretch *round ligament stretch on bench  *Glute stretch on bench *decompression position with noodle wrapped posteriorly then anteriorly across chest cycling *walking between exercises for recovery    Pt requires the buoyancy and hydrostatic pressure of water for support, and to offload joints by unweighting joint load by at least 50 % in navel deep water and by at least 75-80% in chest to neck deep water.  Viscosity of the water is needed for resistance of strengthening. Water current perturbations provides challenge to standing balance requiring increased core activation.  02/05/24 Seated hip flexor stretch 2x20 sec B (chair) Seated hip ADD stretch 2x 20 sec B (chair) Open Books x 5 with blue foam roll Hip IR with leg supported on foam roll 2 x 10 bilateral (first 10 felt good, 2nd set not comfortable) Hooklying  Clam x 10 - some cramping R SL hip ABD with hips at 45 deg flexion x 10 B Hooklying red loop clam x 20      [facility] Adult PT Treatment:  DATE: 01/29/24 Pt seen for aquatic therapy today.  Treatment took place in water 3.5-4.75 ft in depth at the [facility] pool. Temp of water was 91.  Pt entered/exited the pool via stairs using alternating pattern with hand rail.  *walking forward, back and side stepping in 3.6-4.0 ft unsupported *Yellow HB carry bil then unilaterally forward and back *L stretch x 3 with long hold; hip hiking with stretch *figure four stretch holding to hand rails *round ligament stretch on bench  *Glute stretch on bench *hip flex stretch on step *decompression position with noodle wrapped posteriorly then anteriorly across chest cycling *walking between exercises for recovery   Pt requires the buoyancy and hydrostatic pressure of water for support, and to offload joints by unweighting joint load by at least 50 % in navel deep water and by at least 75-80% in chest to neck deep water.  Viscosity of the water is needed for resistance of strengthening. Water current perturbations provides challenge to standing balance requiring increased core activation.     PATIENT EDUCATION:  Education details: PT eval findings, anticipated POC, initial HEP, and KT tape precautions   Person educated: Patient Education method: Explanation, Demonstration, Tactile cues, Verbal cues, and Handouts Education comprehension: verbalized understanding and returned demonstration  HOME EXERCISE PROGRAM: Access Code: [email or web address] URL: [email or web address] Date: 02/05/2024 Prepared by: Mliss  Exercises - Seated Lateral Trunk Stretch on Swiss Ball  - 1 x daily - 7 x weekly - 3 reps - 5 breaths hold - Seated Hip Adduction Squeeze with Ball  - 2 x daily - 7 x weekly - 2 sets - 10 reps - 5 hold - Seated Piriformis Stretch  - 3 x  daily - 7 x weekly - 1 sets - 3 reps - 30-60 sec hold - Diaphragmatic Breathing in Supported Child's Pose with Pelvic Floor Relaxation  - 1 x daily - 3 x weekly - 2 sets - 10 reps - Quadruped Cat Cow  - 1 x daily - 3 x weekly - 2 sets - 10 reps - Seated Hip Flexor Stretch  - 2 x daily - 7 x weekly - 1 sets - 3 reps - 30-60 sec hold - Seated Hip Adductor Stretch  - 1 x daily - 3 x weekly - 2 sets - 10 reps - Sidelying Open Book Thoracic Rotation with Knee on Foam Roll  - 1 x daily - 3 x weekly - 2 sets - 10 reps - Sidelying Hip Abduction  - 1 x daily - 3 x weekly - 2 sets - 10 reps - Hooklying Clamshell with Resistance  - 1 x daily - 3 x weekly - 2 sets - 10 reps - Sidelying Reverse Clamshell  - 1 x daily - 3 x weekly - 2 sets - 10 reps  Pt education: KT tape and aquatic info   ASSESSMENT:  CLINICAL IMPRESSION: Pt arrives late again limiting session. Her due date is approaching (11 days). She reports hip pain has resolved but she is having LBP particularly when in bed at night. She tolerates stretching well release posterior core tightness. She reports compliance with initial HEP although does not have pool access to complete aquatic exercises. With reduction in hip pain pt completing STS transfers and amb without limitation from hip but due to progression into late 3 trimester of pregnancy she has had an expected increase in LBP. Multiple goals met today. Goals ongoing     Initial Impression Patient is a 28 y.o. female who was seen  today for physical therapy evaluation and treatment for bil hip pain beginning 2-3 months ago. She is [redacted] weeks pregnant. She rates her pain at 10/10 and has increased pain with supine and sidelying, prolonged sitting, sit to stand transfers and walking. Her pain affects her ability to sleep, work and take care of her family. She also reports low back pain. She has functional hip ROM, but flexibility deficits and marked TTP in hip musculature. She will benefit from  skilled PT to address these deficits and those listed below. She would likely benefit from aquatic PT as well due to her intolerance to lying down.   OBJECTIVE IMPAIRMENTS: decreased activity tolerance, difficulty walking, decreased strength, increased muscle spasms, impaired flexibility, postural dysfunction, and pain.   ACTIVITY LIMITATIONS: carrying, lifting, bending, sitting, standing, squatting, sleeping, stairs, transfers, bed mobility, bathing, toileting, dressing, hygiene/grooming, locomotion level, and caring for others  PARTICIPATION LIMITATIONS: meal prep, cleaning, laundry, driving, shopping, community activity, and occupation  PERSONAL FACTORS: Time since onset of injury/illness/exacerbation and 1 comorbidity: pregnancy are also affecting patient's functional outcome.   REHAB POTENTIAL: Good  CLINICAL DECISION MAKING: Stable/uncomplicated  EVALUATION COMPLEXITY: Low   GOALS: Goals reviewed with patient? Yes  SHORT TERM GOALS: Target date: 02/19/2024  The patient will demonstrate knowledge of basic self care strategies and exercises to promote healing  including MFR  Baseline: Goal status: Met 02/14/24  2.  The patient will report a 30% improvement in pain levels with functional activities which are currently difficult including sit to stand, turning over in bed and bending Baseline:  Goal status: Met 02/08/24  3.  Ind with initial HEP Baseline:  Goal status: Met 02/14/24    LONG TERM GOALS: Target date: 03/18/2024  The patient will be independent in a safe self progression of a home exercise program to promote further recovery of function   Baseline:  Goal status: INITIAL  2. The patient will report a 75% improvement in pain levels with functional activities which are currently difficult including bed mobility, sit to stand, sitting and walking. Baseline:  Goal status: INITIAL  3.   Improved PSFS score to 2 (average) indicating improved function with less  pain Baseline:  Goal status: INITIAL    PLAN:  PT FREQUENCY: 2x/week  PT DURATION: 8 weeks  PLANNED INTERVENTIONS: 97164- PT Re-evaluation, 97110-Therapeutic exercises, 97530- Therapeutic activity, 97112- Neuromuscular re-education, 97535- Self Care, 02859- Manual therapy, [phone]- Aquatic Therapy, [phone]- Ionotophoresis 4mg /ml Dexamethasone , 79439 (1-2 muscles), 20561 (3+ muscles)- Dry Needling, Patient/Family education, Taping, Joint mobilization, Spinal mobilization, Cryotherapy, and Moist heat  PLAN FOR NEXT SESSION: Review and progress HEP, [place]: add ITB stretch, HS stretch, hip flexor stretch, ADDuctor stretch, core and LE strengthening, manual to B gluteals and lumbar.   [address] [place]) Frank Novelo MPT 02/14/24 12:20 PM [facility] [address] [place], [place], [address] Phone: [phone]   Fax:  [phone]  Addend Ronal Foots) Modesto Ganoe MPT 02/18/24 1:43 PM [facility] [address] [place], [place], [address] Phone: [phone]   Fax:  [phone]

## 2024-02-18 ENCOUNTER — Encounter (HOSPITAL_BASED_OUTPATIENT_CLINIC_OR_DEPARTMENT_OTHER): Payer: Self-pay | Admitting: Physical Therapy

## 2024-02-18 ENCOUNTER — Encounter (HOSPITAL_BASED_OUTPATIENT_CLINIC_OR_DEPARTMENT_OTHER): Attending: Obstetrics and Gynecology | Admitting: Physical Therapy

## 2024-02-18 DIAGNOSIS — M25552 Pain in left hip: Secondary | ICD-10-CM | POA: Insufficient documentation

## 2024-02-18 DIAGNOSIS — R252 Cramp and spasm: Secondary | ICD-10-CM | POA: Diagnosis present

## 2024-02-18 DIAGNOSIS — M25551 Pain in right hip: Secondary | ICD-10-CM | POA: Diagnosis present

## 2024-02-18 NOTE — Therapy (Signed)
 OUTPATIENT PHYSICAL THERAPY LOWER EXTREMITY    Patient Name: Heather Noble MRN: [id number] DOB:March 31, 1996, 28 y.o., female Today's Date: 02/18/2024  END OF SESSION:  PT End of Session - 02/18/24 1337     Visit Number 7    Date for PT Re-Evaluation 03/18/24    Authorization Type [employer] MCD no auth required    PT Start Time 1335    PT Stop Time 1400    PT Time Calculation (min) 25 min    Activity Tolerance Patient tolerated treatment well    Behavior During Therapy [facility] for tasks assessed/performed            Past Medical History:  Diagnosis Date   Anemia during pregnancy 11/03/2016   Headache    Pregnancy induced hypertension    Sickle cell trait (HCC) 11/03/2016   Past Surgical History:  Procedure Laterality Date   CESAREAN SECTION N/A 04/21/2017   Procedure: CESAREAN SECTION;  Surgeon: Herchel Gloris LABOR, MD;  [facility]: [facility];  Service: Obstetrics;  Laterality: N/A;   WISDOM TOOTH EXTRACTION  2015   Patient Active Problem List   Diagnosis Date Noted   History of severe pre-eclampsia 12/26/2023   History of postpartum hemorrhage, currently pregnant in third trimester 12/26/2023   Marginal insertion of umbilical cord affecting management of mother in third trimester 12/26/2023   Late prenatal care affecting pregnancy in third trimester 12/26/2023   Pregnancy 11/28/2023   Seizure-like activity (HCC) 06/05/2021   History of gestational hypertension 04/21/2017   Sickle cell trait (HCC) 11/03/2016   Anemia during pregnancy 11/03/2016    PCP: Paseda, Folashade R, FNP   REFERRING PROVIDER: Armond Cape, MD   REFERRING DIAG: M25.559 (ICD-10-CM) - Pain in unspecified hip   THERAPY DIAG:  Pain in right hip  Pain in left hip  Cramp and spasm  Rationale for Evaluation and Treatment: Rehabilitation  ONSET DATE: 2 months ago  SUBJECTIVE:   SUBJECTIVE STATEMENT: I feel a lot of pressure. Back and hip pain better   Initial Subjective Hip  pain became very sever 2 months ago. She has had LBP the entire pregnancy. Patient is [redacted] weeks pregnant. EDD 02/24/24. Third pregnancy. Has a 29 yo and 29 yo. Patient reports back pain when not pregnant,but not hip pain. Right hip usually hurts the most. Uses step stool to get into bed. Sometimes has to lift leg.   PERTINENT HISTORY: Csection 2018 and VBAC for second,  anemia and HTN during pregnancy PAIN:  Are you having pain? Yes: NPRS scale: 0/10 Pain location: front sides and back of hips Pain description: sharp pain Aggravating factors: sleeping  Relieving factors: pillow between knees or under knees helps some, LTR  PRECAUTIONS: Other: pregnant  RED FLAGS: None   WEIGHT BEARING RESTRICTIONS: No  FALLS:  Has patient fallen in last 6 months? Yes. 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  DIAGNOSTIC FINDINGS: none  PATIENT SURVEYS:  PSFS: THE PATIENT SPECIFIC FUNCTIONAL SCALE  Place score of 0-10 (0 = unable to perform activity and 10 = able to perform activity at the same  level as before injury or problem)  Activity Date: 01/22/24    Sleeping 4    2.sit to stand transition 5    3.sitting/driving 3    4.      Total Score 4      Total Score = Sum of activity scores/number of activities  Minimally Detectable Change: 3 points (for single activity); 2 points (for average score)  [name]. The Patient Specific Functional Scale . Retrieved from [email or web address]   COGNITION: Overall  cognitive status: Within functional limits for tasks assessed     SENSATION: WFL  MUSCLE LENGTH: Piriformis B, L HS  POSTURE: increased lumbar lordosis  PALPATION: Marked pain in gluteals, SIJ  LOWER EXTREMITY ROM: WFL for tasks assessed  LOWER EXTREMITY MMT: Hips 4+/5, pain with hip flexion and ABD   FUNCTIONAL TESTS:  5 times sit to stand: 27.84 sec                                                                                                                               TREATMENT DATE:   [facility] Adult PT Treatment:                                                DATE: 02/18/24 Pt seen for aquatic therapy today.  Treatment took place in water 3.5-4.75 ft in depth at the [facility] pool. Temp of water was 91.  Pt entered/exited the pool via stairs using alternating pattern with hand rail.  *walking forward, back and side stepping in 3.6-4.0 ft unsupported *L stretch x 3 with long hold; hip hiking with stretch *Seated swing like on yellow noodle: PPT *round ligament stretch on bench  *Glute stretch on bench *decompression position with noodle wrapped posteriorly then anteriorly across chest cycling  Pt requires the buoyancy and hydrostatic pressure of water for support, and to offload joints by unweighting joint load by at least 50 % in navel deep water and by at least 75-80% in chest to neck deep water.  Viscosity of the water is needed for resistance of strengthening. Water current perturbations provides challenge to standing balance requiring increased core activation.  02/05/24 Seated hip flexor stretch 2x20 sec B (chair) Seated hip ADD stretch 2x 20 sec B (chair) Open Books x 5 with blue foam roll Hip IR with leg supported on foam roll 2 x 10 bilateral (first 10 felt good, 2nd set not comfortable) Hooklying Clam x 10 - some cramping R SL hip ABD with hips at 45 deg flexion x 10 B Hooklying red loop clam x 20      [facility] Adult PT Treatment:  DATE: 01/29/24 Pt seen for aquatic therapy today.  Treatment took place in water 3.5-4.75 ft in depth at the [facility] pool. Temp of water was 91.  Pt entered/exited the pool via stairs using alternating pattern with hand rail.  *walking forward, back and side stepping in 3.6-4.0 ft unsupported *Yellow HB carry bil then unilaterally forward and back *L stretch x 3 with long hold; hip hiking with stretch *figure four stretch holding to hand rails *round ligament stretch on bench  *Glute stretch on bench *hip flex stretch on step *decompression position with noodle wrapped posteriorly then anteriorly across chest cycling *walking between exercises for recovery   Pt requires the buoyancy and hydrostatic pressure of water for support, and to offload joints by unweighting joint load by at least 50 % in navel deep water and by at least 75-80% in chest to neck deep water.  Viscosity of the water is needed for resistance of strengthening. Water current perturbations provides challenge to standing balance requiring increased core activation.     PATIENT EDUCATION:  Education details: PT eval findings, anticipated POC, initial HEP, and KT tape precautions   Person educated: Patient Education method: Explanation, Demonstration, Tactile cues, Verbal cues, and Handouts Education comprehension: verbalized understanding and returned demonstration  HOME EXERCISE PROGRAM: Access Code: [email or web address] URL: [email or web address] Date: 02/05/2024 Prepared by: Mliss  Exercises - Seated Lateral Trunk Stretch on Swiss Ball  - 1 x daily - 7 x weekly - 3 reps - 5 breaths hold - Seated Hip Adduction Squeeze with Ball  - 2 x daily - 7 x weekly - 2 sets - 10 reps - 5 hold - Seated Piriformis Stretch  - 3 x daily - 7 x weekly - 1 sets - 3 reps - 30-60 sec hold - Diaphragmatic Breathing in Supported Child's Pose with Pelvic Floor Relaxation  - 1 x daily - 3 x weekly  - 2 sets - 10 reps - Quadruped Cat Cow  - 1 x daily - 3 x weekly - 2 sets - 10 reps - Seated Hip Flexor Stretch  - 2 x daily - 7 x weekly - 1 sets - 3 reps - 30-60 sec hold - Seated Hip Adductor Stretch  - 1 x daily - 3 x weekly - 2 sets - 10 reps - Sidelying Open Book Thoracic Rotation with Knee on Foam Roll  - 1 x daily - 3 x weekly - 2 sets - 10 reps - Sidelying Hip Abduction  - 1 x daily - 3 x weekly - 2 sets - 10 reps - Hooklying Clamshell with Resistance  - 1 x daily - 3 x weekly - 2 sets - 10 reps - Sidelying Reverse Clamshell  - 1 x daily - 3 x weekly - 2 sets - 10 reps  Pt education: KT tape and aquatic info   ASSESSMENT:  CLINICAL IMPRESSION: Pt arrives late again limiting session again. She reports no pain but a lot of pressure.  Pt due in 6 days.  She is directed through stretching. Pt is tolerating all functional mobility and ADL's without limitation to LB or hip pain.  Goals ongoing.       Initial Impression Patient is a 28 y.o. female who was seen today for physical therapy evaluation and treatment for bil hip pain beginning 2-3 months ago. She is [redacted] weeks pregnant. She rates her pain at 10/10 and has increased pain with supine and sidelying, prolonged sitting, sit to stand transfers and walking. Her pain affects  her ability to sleep, work and take care of her family. She also reports low back pain. She has functional hip ROM, but flexibility deficits and marked TTP in hip musculature. She will benefit from skilled PT to address these deficits and those listed below. She would likely benefit from aquatic PT as well due to her intolerance to lying down.   OBJECTIVE IMPAIRMENTS: decreased activity tolerance, difficulty walking, decreased strength, increased muscle spasms, impaired flexibility, postural dysfunction, and pain.   ACTIVITY LIMITATIONS: carrying, lifting, bending, sitting, standing, squatting, sleeping, stairs, transfers, bed mobility, bathing, toileting, dressing,  hygiene/grooming, locomotion level, and caring for others  PARTICIPATION LIMITATIONS: meal prep, cleaning, laundry, driving, shopping, community activity, and occupation  PERSONAL FACTORS: Time since onset of injury/illness/exacerbation and 1 comorbidity: pregnancy are also affecting patient's functional outcome.   REHAB POTENTIAL: Good  CLINICAL DECISION MAKING: Stable/uncomplicated  EVALUATION COMPLEXITY: Low   GOALS: Goals reviewed with patient? Yes  SHORT TERM GOALS: Target date: 02/19/2024  The patient will demonstrate knowledge of basic self care strategies and exercises to promote healing  including MFR  Baseline: Goal status: Met 02/14/24  2.  The patient will report a 30% improvement in pain levels with functional activities which are currently difficult including sit to stand, turning over in bed and bending Baseline:  Goal status: Met 02/08/24  3.  Ind with initial HEP Baseline:  Goal status: Met 02/14/24    LONG TERM GOALS: Target date: 03/18/2024  The patient will be independent in a safe self progression of a home exercise program to promote further recovery of function   Baseline:  Goal status: INITIAL  2. The patient will report a 75% improvement in pain levels with functional activities which are currently difficult including bed mobility, sit to stand, sitting and walking. Baseline:  Goal status: INITIAL  3.   Improved PSFS score to 2 (average) indicating improved function with less pain Baseline:  Goal status: INITIAL    PLAN:  PT FREQUENCY: 2x/week  PT DURATION: 8 weeks  PLANNED INTERVENTIONS: 97164- PT Re-evaluation, 97110-Therapeutic exercises, 97530- Therapeutic activity, 97112- Neuromuscular re-education, 97535- Self Care, 02859- Manual therapy, [phone]- Aquatic Therapy, [phone]- Ionotophoresis 4mg /ml Dexamethasone , 79439 (1-2 muscles), 20561 (3+ muscles)- Dry Needling, Patient/Family education, Taping, Joint mobilization, Spinal mobilization,  Cryotherapy, and Moist heat  PLAN FOR NEXT SESSION: Review and progress HEP, Land: add ITB stretch, HS stretch, hip flexor stretch, ADDuctor stretch, core and LE strengthening, manual to B gluteals and lumbar.   Ronal Hammond) Macarthur Lorusso MPT 02/18/24 1:43 PM [facility] [address] [place], [place], [address] Phone: [phone]   Fax:  [phone]

## 2024-02-21 ENCOUNTER — Ambulatory Visit (HOSPITAL_BASED_OUTPATIENT_CLINIC_OR_DEPARTMENT_OTHER): Admitting: Physical Therapy

## 2024-02-22 ENCOUNTER — Other Ambulatory Visit: Payer: Self-pay

## 2024-02-22 ENCOUNTER — Inpatient Hospital Stay (HOSPITAL_COMMUNITY)
Admission: AD | Admit: 2024-02-22 | Discharge: 2024-02-27 | DRG: 806 | Disposition: A | Discharge status: Home / Self Care | Attending: Obstetrics and Gynecology | Admitting: Obstetrics and Gynecology

## 2024-02-22 ENCOUNTER — Encounter (HOSPITAL_COMMUNITY): Payer: Self-pay | Admitting: Obstetrics and Gynecology

## 2024-02-22 DIAGNOSIS — O9902 Anemia complicating childbirth: Secondary | ICD-10-CM | POA: Diagnosis present

## 2024-02-22 DIAGNOSIS — D573 Sickle-cell trait: Secondary | ICD-10-CM | POA: Diagnosis present

## 2024-02-22 DIAGNOSIS — Z8249 Family history of ischemic heart disease and other diseases of the circulatory system: Secondary | ICD-10-CM | POA: Diagnosis not present

## 2024-02-22 DIAGNOSIS — O1414 Severe pre-eclampsia complicating childbirth: Secondary | ICD-10-CM | POA: Diagnosis present

## 2024-02-22 DIAGNOSIS — O34219 Maternal care for unspecified type scar from previous cesarean delivery: Secondary | ICD-10-CM | POA: Diagnosis not present

## 2024-02-22 DIAGNOSIS — I161 Hypertensive emergency: Secondary | ICD-10-CM | POA: Diagnosis present

## 2024-02-22 DIAGNOSIS — O99019 Anemia complicating pregnancy, unspecified trimester: Secondary | ICD-10-CM | POA: Diagnosis present

## 2024-02-22 DIAGNOSIS — O34211 Maternal care for low transverse scar from previous cesarean delivery: Secondary | ICD-10-CM | POA: Diagnosis present

## 2024-02-22 DIAGNOSIS — O1413 Severe pre-eclampsia, third trimester: Principal | ICD-10-CM | POA: Diagnosis present

## 2024-02-22 DIAGNOSIS — Z3A39 39 weeks gestation of pregnancy: Secondary | ICD-10-CM

## 2024-02-22 DIAGNOSIS — Z833 Family history of diabetes mellitus: Secondary | ICD-10-CM | POA: Diagnosis not present

## 2024-02-22 HISTORY — DX: Eclampsia, unspecified as to time period: O15.9

## 2024-02-22 LAB — COMPREHENSIVE METABOLIC PANEL WITH GFR
ALT: 13 U/L (ref 0–44)
AST: 31 U/L (ref 15–41)
Albumin: 3 g/dL — ABNORMAL LOW (ref 3.5–5.0)
Alkaline Phosphatase: 118 U/L (ref 38–126)
Anion gap: 14 (ref 5–15)
BUN: 7 mg/dL (ref 6–20)
CO2: 19 mmol/L — ABNORMAL LOW (ref 22–32)
Calcium: 9.2 mg/dL (ref 8.9–10.3)
Chloride: 103 mmol/L (ref 98–111)
Creatinine, Ser: 0.85 mg/dL (ref 0.44–1.00)
GFR, Estimated: >60 mL/min (ref >60)
Glucose, Bld: 139 mg/dL — ABNORMAL HIGH (ref 70–99)
Potassium: 3.9 mmol/L (ref 3.5–5.1)
Sodium: 136 mmol/L (ref 135–145)
Total Bilirubin: 0.8 mg/dL (ref 0.0–1.2)
Total Protein: 7.1 g/dL (ref 6.5–8.1)

## 2024-02-22 LAB — PROTEIN / CREATININE RATIO, URINE
Creatinine, Urine: 70 mg/dL
Protein Creatinine Ratio: 0.1 mg/mg{creat} (ref 0.00–0.15)
Total Protein, Urine: 7 mg/dL

## 2024-02-22 LAB — CBC
HCT: 33.9 % — ABNORMAL LOW (ref 36.0–46.0)
Hemoglobin: 11.5 g/dL — ABNORMAL LOW (ref 12.0–15.0)
MCH: 30.8 pg (ref 26.0–34.0)
MCHC: 33.9 g/dL (ref 30.0–36.0)
MCV: 90.9 fL (ref 80.0–100.0)
Platelets: 309 K/uL (ref 150–400)
RBC: 3.73 MIL/uL — ABNORMAL LOW (ref 3.87–5.11)
RDW: 14.5 % (ref 11.5–15.5)
WBC: 7 K/uL (ref 4.0–10.5)
nRBC: 0 % (ref 0.0–0.2)

## 2024-02-22 LAB — TYPE AND SCREEN
ABO/RH(D): A POS
Antibody Screen: NEGATIVE

## 2024-02-22 MED ORDER — ONDANSETRON HCL 4 MG/2ML IJ SOLN: 4.0000 mg | Freq: Four times a day (QID) | INTRAMUSCULAR | Status: DC | PRN

## 2024-02-22 MED ORDER — LACTATED RINGERS IV SOLN
500.0000 mL | INTRAVENOUS | Status: AC | PRN
  Administered 2024-02-22: 500 mL via INTRAVENOUS

## 2024-02-22 MED ORDER — ACETAMINOPHEN 325 MG PO TABS
650.0000 mg | ORAL_TABLET | ORAL | Status: DC | PRN
  Filled 2024-02-22: qty 2

## 2024-02-22 MED ORDER — LABETALOL HCL 5 MG/ML IV SOLN: 80.0000 mg | INTRAVENOUS | Status: DC | PRN

## 2024-02-22 MED ORDER — FLEET ENEMA RE ENEM: 1.0000 | ENEMA | RECTAL | Status: DC | PRN

## 2024-02-22 MED ORDER — OXYTOCIN BOLUS FROM INFUSION
333.0000 mL | Freq: Once | INTRAVENOUS | Status: AC
  Administered 2024-02-24: 333 mL via INTRAVENOUS

## 2024-02-22 MED ORDER — OXYTOCIN-SODIUM CHLORIDE 30-0.9 UT/500ML-% IV SOLN
1.0000 m[IU]/min | INTRAVENOUS | Status: DC
  Administered 2024-02-22: 2 m[IU]/min via INTRAVENOUS
  Filled 2024-02-22 (×2): qty 500

## 2024-02-22 MED ORDER — MAGNESIUM SULFATE 40 GM/1000ML IV SOLN
INTRAVENOUS | Status: AC
  Filled 2024-02-22: qty 1000

## 2024-02-22 MED ORDER — LABETALOL HCL 5 MG/ML IV SOLN: 40.0000 mg | INTRAVENOUS | Status: DC | PRN

## 2024-02-22 MED ORDER — FENTANYL CITRATE (PF) 100 MCG/2ML IJ SOLN
50.0000 ug | INTRAMUSCULAR | Status: DC | PRN
  Administered 2024-02-22: 50 ug via INTRAVENOUS
  Administered 2024-02-23 (×2): 100 ug via INTRAVENOUS
  Administered 2024-02-23: 50 ug via INTRAVENOUS
  Administered 2024-02-24: 100 ug via INTRAVENOUS
  Filled 2024-02-22 (×5): qty 2

## 2024-02-22 MED ORDER — HYDRALAZINE HCL 20 MG/ML IJ SOLN: 10.0000 mg | INTRAMUSCULAR | Status: DC | PRN

## 2024-02-22 MED ORDER — OXYTOCIN-SODIUM CHLORIDE 30-0.9 UT/500ML-% IV SOLN
2.5000 [IU]/h | INTRAVENOUS | Status: DC
  Administered 2024-02-24: 2.5 [IU]/h via INTRAVENOUS

## 2024-02-22 MED ORDER — MAGNESIUM SULFATE 40 GM/1000ML IV SOLN
2.0000 g/h | INTRAVENOUS | Status: DC
  Administered 2024-02-22 – 2024-02-24 (×2): 2 g/h via INTRAVENOUS
  Filled 2024-02-22 (×2): qty 1000

## 2024-02-22 MED ORDER — LABETALOL HCL 5 MG/ML IV SOLN: 20.0000 mg | INTRAVENOUS | Status: DC | PRN

## 2024-02-22 MED ORDER — TERBUTALINE SULFATE 1 MG/ML IJ SOLN: 0.2500 mg | Freq: Once | INTRAMUSCULAR | Status: DC | PRN

## 2024-02-22 MED ORDER — BUTALBITAL-APAP-CAFFEINE 50-325-40 MG PO TABS
2.0000 | ORAL_TABLET | Freq: Four times a day (QID) | ORAL | Status: DC | PRN
  Administered 2024-02-22 – 2024-02-23 (×3): 2 via ORAL
  Filled 2024-02-22 (×3): qty 2

## 2024-02-22 MED ORDER — MAGNESIUM SULFATE BOLUS VIA INFUSION
4.0000 g | Freq: Once | INTRAVENOUS | Status: AC
  Administered 2024-02-22: 4 g via INTRAVENOUS
  Filled 2024-02-22: qty 1000

## 2024-02-22 MED ORDER — SOD CITRATE-CITRIC ACID 500-334 MG/5ML PO SOLN: 30.0000 mL | ORAL | Status: DC | PRN

## 2024-02-22 MED ORDER — LACTATED RINGERS IV SOLN
INTRAVENOUS | Status: AC
  Administered 2024-02-23: 75 mL/h via INTRAVENOUS

## 2024-02-22 MED ORDER — LIDOCAINE HCL (PF) 1 % IJ SOLN: 30.0000 mL | INTRAMUSCULAR | Status: DC | PRN

## 2024-02-22 NOTE — H&P (Addendum)
 OB ADMISSION/ HISTORY & PHYSICAL:  Admission Date: 02/22/2024  3:48 PM  Admit Diagnosis: Pre-eclampsia with severe features  Heather Noble is a 28 y.o. female G3P2002 [redacted]w[redacted]d was seen in the office and c/o a HA x 7 days, blurred vision despite wearing corrected lenses, and BP of 150's/80-90's at home. Hx of eclampsia w/ last pregnancy in 2022. Direct admit for IOL. Hx of primary LTCS 2019, VBAC 2022, and desires another TOLAC. PPH in 2022 w/ QBL of  650 mL. Endorses active FM, denies LOF and vaginal bleeding.   History of current pregnancy: [id number]   Patient entered care with [facility] at 24+3 wks.   EDC 02/24/24 by US  at 24 wks   Anatomy scan:  complete w/ anterior placenta.   Antenatal testing: for hx of pre-eclampsia started at 32 weeks Last evaluation: 35+4 wks cephalic/anterior placenta/ AFI 16/ EFW 5+9 (44%)   Significant prenatal events:  Patient Active Problem List   Diagnosis Date Noted   Preeclampsia, severe, third trimester 02/22/2024   History of severe pre-eclampsia 12/26/2023   History of postpartum hemorrhage, currently pregnant in third trimester 12/26/2023   Marginal insertion of umbilical cord affecting management of mother in third trimester 12/26/2023   Late prenatal care affecting pregnancy in third trimester 12/26/2023   Seizure-like activity (HCC) 06/05/2021   Sickle cell trait (HCC) 11/03/2016   Anemia during pregnancy 11/03/2016    Prenatal Labs: ABO, Rh: --/--/A POS (09/19 1612) Antibody: NEG (09/19 1612) Rubella: Nonimmune (06/04 0000)  RPR:   NR HBsAg: Negative (06/04 0000)  HIV: Non-reactive (06/04 0000)  GTT: normal 1 hr GBS:   neg GC/CHL: neg/neg Genetics: low-risk, sickle cell trait, father status unknown Vaccines: Tdap: declined Influenza: declined   OB History  Gravida Para Term Preterm AB Living  3 2 2  0 0 2  SAB IAB Ectopic Multiple Live Births  0 0 0 0 2    # Outcome Date GA Lbr Len/2nd Weight Sex Type Anes PTL Lv  3 Current            2 Term 04/28/21 [redacted]w[redacted]d 05:49 / 00:18 3314 g M VBAC None  LIV  1 Term 04/21/17 [redacted]w[redacted]d  3374 g M CS-LTranv   LIV    Medical / Surgical History: Past medical history:  Past Medical History:  Diagnosis Date   Anemia during pregnancy 11/03/2016   Headache    Pregnancy induced hypertension    Sickle cell trait (HCC) 11/03/2016    Past surgical history:  Past Surgical History:  Procedure Laterality Date   CESAREAN SECTION N/A 04/21/2017   Procedure: CESAREAN SECTION;  Surgeon: Herchel Gloris LABOR, MD;  Location: [facility];  Service: Obstetrics;  Laterality: N/A;   WISDOM TOOTH EXTRACTION  2015   Family History:  Family History  Problem Relation Age of Onset   Hypertension Maternal Grandmother    Osteoarthritis Maternal Grandmother    Gout Maternal Grandmother    Thyroid disease Maternal Grandmother    Diabetes Maternal Grandmother    Cancer Paternal Grandfather        unknown    Social History:  reports that she has never smoked. She has never used smokeless tobacco. She reports that she does not drink alcohol and does not use drugs.  Allergies: Patient has no known allergies.   Current Medications at time of admission:  Prior to Admission medications   Medication Sig Start Date End Date Taking? Authorizing Provider  norgestimate -ethinyl estradiol  (ORTHO-CYCLEN) 0.25-35 MG-MCG tablet Take 1 tablet by  mouth daily. Patient not taking: Reported on 11/28/2023 05/27/21   Eveline Lynwood MATSU, MD  Prenatal Vit-Fe Fumarate-FA (PRENATAL VITAMIN PO) Take by mouth.    [provider]    Review of Systems: Constitutional: Negative   HENT: Negative   Eyes: Negative   Respiratory: Negative   Cardiovascular: Negative   Gastrointestinal: Negative  Genitourinary: neg for bloody show, neg for LOF   Musculoskeletal: Negative   Skin: Negative   Neurological: Negative   Endo/Heme/Allergies: Negative   Psychiatric/Behavioral: Negative    Physical Exam: VS: Blood pressure  136/78, pulse 88, temperature 98 F (36.7 C), temperature source Oral, resp. rate 18, height 5' 5 (1.651 m), weight 109.8 kg. AAO x3, no signs of distress Cardiovascular: RRR Respiratory: Unlabored GU/GI: Abdomen gravid, non-tender, non-distended, active FM, vertex Extremities: no edema, negative for pain, tenderness, and cords  Cervical exam:1/thick/ high/intact exam in office FHR: baseline rate 130 / variability moderate / accelerations present / absent decelerations TOCO: occasional   Prenatal Transfer Tool  Maternal Diabetes: No Genetic Screening: Normal Maternal Ultrasounds/Referrals: Normal Fetal Ultrasounds or other Referrals:  None Maternal Substance Abuse:  No Significant Maternal Medications:  None Significant Maternal Lab Results: Group B Strep negative Number of Prenatal Visits:greater than 3 verified prenatal visits Maternal Vaccinations: declined Other Comments:  None    Assessment: 28 y.o. H6E7997 [redacted]w[redacted]d Pre-eclampsia with severe features - headache and visual changes  Previous LTCS    -primary C/S for [facility] 2019, VBAC 2022, desires TOLAC   Hx of eclampsia    - pre-eclampsia in 2022, managed on mag sulfate, eclamptic seizure 5 days PP witnessed at home. Presented to ED and treated for a hypertensive emergency.   FHR category 1 GBS neg Pain management plan: unmedicated, aware of all options   Plan:  Admit to L&D Routine admission orders Epidural PRN Pitocin  for cervical ripening, mechanical ripening w/ Cook balloon PRN, and amniotomy PRN Active management of 3rd stage    -Hx of PPH in 2022    -currently on mag Dr Henry notified of admission and plan of care  Mercer KATHEE Peal DNP, CNM 02/22/2024 6:26 PM

## 2024-02-22 NOTE — Progress Notes (Addendum)
 Subjective:    Doing well, tolerated attempt to place cervical balloon.   Objective:    VS: BP 127/74   Pulse 85   Temp 97.7 F (36.5 C) (Oral)   Resp 18   Ht 5' 5 (1.651 m)   Wt 109.8 kg   LMP  (LMP Unknown)   SpO2 98%   BMI 40.27 kg/m  FHR : baseline 135 / variability moderate / accelerations present / absent decelerations Toco: contractions are irregular Membranes: intact Dilation: 2 Effacement (%): 40 Station: -3 Presentation: Vertex Exam by:: Alyus Mofield Pitocin  10 mU/min CMP     Component Value Date/Time   NA 136 02/22/2024 1630   NA 142 03/15/2021 1646   K 3.9 02/22/2024 1630   CL 103 02/22/2024 1630   CO2 19 (L) 02/22/2024 1630   GLUCOSE 139 (H) 02/22/2024 1630   BUN 7 02/22/2024 1630   BUN 5 (L) 03/15/2021 1646   CREATININE 0.85 02/22/2024 1630   CALCIUM  9.2 02/22/2024 1630   PROT 7.1 02/22/2024 1630   PROT 7.1 03/15/2021 1646   ALBUMIN 3.0 (L) 02/22/2024 1630   ALBUMIN 3.4 (L) 03/15/2021 1646   AST 31 02/22/2024 1630   ALT 13 02/22/2024 1630   ALKPHOS 118 02/22/2024 1630   BILITOT 0.8 02/22/2024 1630   BILITOT 0.5 03/15/2021 1646   EGFR 125 03/15/2021 1646   GFRNONAA >60 02/22/2024 1630    Intake/Output Summary (Last 24 hours) at 02/22/2024 2348 Last data filed at 02/22/2024 2032 Gross per 24 hour  Intake 1745.99 ml  Output 650 ml  Net 1095.99 ml     Assessment/Plan:   28 y.o. H6E7997 [redacted]w[redacted]d  Labor: ripening w/ Pitocin , unsuccessful w/ Cook balloon placement, continue to increase Pitocin  Preeclampsia:  normotensive to mild range, neg neuro S/S, PC ratio 0.1 Fetal Wellbeing:  Category I Pain Control:  IV pain med for balloon placement attempt I/D:  GBS neg Anticipated MOD:  VBAC  Heather KATHEE Peal DNP, CNM 02/22/2024 11:42 PM

## 2024-02-23 ENCOUNTER — Encounter (HOSPITAL_COMMUNITY): Payer: Self-pay | Admitting: Obstetrics and Gynecology

## 2024-02-23 LAB — COMPREHENSIVE METABOLIC PANEL WITH GFR
ALT: 13 U/L (ref 0–44)
AST: 23 U/L (ref 15–41)
Albumin: 2.9 g/dL — ABNORMAL LOW (ref 3.5–5.0)
Alkaline Phosphatase: 112 U/L (ref 38–126)
Anion gap: 9 (ref 5–15)
BUN: <5 mg/dL — ABNORMAL LOW (ref 6–20)
CO2: 22 mmol/L (ref 22–32)
Calcium: 7.7 mg/dL — ABNORMAL LOW (ref 8.9–10.3)
Chloride: 104 mmol/L (ref 98–111)
Creatinine, Ser: 0.73 mg/dL (ref 0.44–1.00)
GFR, Estimated: >60 mL/min (ref >60)
Glucose, Bld: 101 mg/dL — ABNORMAL HIGH (ref 70–99)
Potassium: 4.2 mmol/L (ref 3.5–5.1)
Sodium: 135 mmol/L (ref 135–145)
Total Bilirubin: 0.8 mg/dL (ref 0.0–1.2)
Total Protein: 7.1 g/dL (ref 6.5–8.1)

## 2024-02-23 LAB — CBC
HCT: 31.1 % — ABNORMAL LOW (ref 36.0–46.0)
HCT: 30.4 % — ABNORMAL LOW (ref 36.0–46.0)
Hemoglobin: 10.2 g/dL — ABNORMAL LOW (ref 12.0–15.0)
Hemoglobin: 10.1 g/dL — ABNORMAL LOW (ref 12.0–15.0)
MCH: 29.7 pg (ref 26.0–34.0)
MCH: 30.3 pg (ref 26.0–34.0)
MCHC: 32.8 g/dL (ref 30.0–36.0)
MCHC: 33.2 g/dL (ref 30.0–36.0)
MCV: 90.4 fL (ref 80.0–100.0)
MCV: 91.3 fL (ref 80.0–100.0)
Platelets: 258 K/uL (ref 150–400)
Platelets: 266 K/uL (ref 150–400)
RBC: 3.44 MIL/uL — ABNORMAL LOW (ref 3.87–5.11)
RBC: 3.33 MIL/uL — ABNORMAL LOW (ref 3.87–5.11)
RDW: 14.5 % (ref 11.5–15.5)
RDW: 14.5 % (ref 11.5–15.5)
WBC: 9.3 K/uL (ref 4.0–10.5)
WBC: 8.7 K/uL (ref 4.0–10.5)
nRBC: 0 % (ref 0.0–0.2)
nRBC: 0 % (ref 0.0–0.2)

## 2024-02-23 LAB — RPR: RPR Ser Ql: NONREACTIVE

## 2024-02-23 LAB — MAGNESIUM: Magnesium: 4.8 mg/dL — ABNORMAL HIGH (ref 1.7–2.4)

## 2024-02-23 MED ORDER — PHENYLEPHRINE 80 MCG/ML (10ML) SYRINGE FOR IV PUSH (FOR BLOOD PRESSURE SUPPORT): 80.0000 ug | PREFILLED_SYRINGE | INTRAVENOUS | Status: DC | PRN

## 2024-02-23 MED ORDER — EPHEDRINE 5 MG/ML INJ: 10.0000 mg | INTRAVENOUS | Status: DC | PRN

## 2024-02-23 MED ORDER — FENTANYL-BUPIVACAINE-NACL 0.5-0.125-0.9 MG/250ML-% EP SOLN: 12.0000 mL/h | EPIDURAL | Status: DC | PRN

## 2024-02-23 MED ORDER — METOCLOPRAMIDE HCL 5 MG/5ML PO SOLN
10.0000 mg | Freq: Four times a day (QID) | ORAL | Status: DC | PRN
  Filled 2024-02-23: qty 10

## 2024-02-23 MED ORDER — DIPHENHYDRAMINE HCL 50 MG/ML IJ SOLN: 12.5000 mg | INTRAMUSCULAR | Status: DC | PRN

## 2024-02-23 MED ORDER — LACTATED RINGERS IV SOLN: 500.0000 mL | Freq: Once | INTRAVENOUS | Status: DC

## 2024-02-23 NOTE — Progress Notes (Signed)
 Subjective:    Resting, discussed amniotomy and pt agrees.   Objective:    VS: BP (!) 148/90   Pulse 86   Temp 98.3 F (36.8 C) (Oral)   Resp 18   Ht 5' 5 (1.651 m)   Wt 109.8 kg   LMP  (LMP Unknown)   SpO2 100%   BMI 40.27 kg/m  FHR : baseline 123 / variability moderate / accelerations present / absent decelerations Toco: contractions every 4-6 minutes  Membranes: AROM, scant, clear Dilation: 4 Effacement (%): 60 Station: -3 Presentation: Vertex Exam by:: Mercer Peal, CNM Pitocin  18 reduced to 8 mU/min  CMP     Component Value Date/Time   NA 135 02/23/2024 1234   NA 142 03/15/2021 1646   K 4.2 02/23/2024 1234   CL 104 02/23/2024 1234   CO2 22 02/23/2024 1234   GLUCOSE 101 (H) 02/23/2024 1234   BUN <5 (L) 02/23/2024 1234   BUN 5 (L) 03/15/2021 1646   CREATININE 0.73 02/23/2024 1234   CALCIUM  7.7 (L) 02/23/2024 1234   PROT 7.1 02/23/2024 1234   PROT 7.1 03/15/2021 1646   ALBUMIN 2.9 (L) 02/23/2024 1234   ALBUMIN 3.4 (L) 03/15/2021 1646   AST 23 02/23/2024 1234   ALT 13 02/23/2024 1234   ALKPHOS 112 02/23/2024 1234   BILITOT 0.8 02/23/2024 1234   BILITOT 0.5 03/15/2021 1646   EGFR 125 03/15/2021 1646   GFRNONAA >60 02/23/2024 1234    Intake/Output Summary (Last 24 hours) at 02/23/2024 2222 Last data filed at 02/23/2024 2100 Gross per 24 hour  Intake 4660.89 ml  Output 7700 ml  Net -3039.11 ml     Assessment/Plan:   28 y.o. [id number] [redacted]w[redacted]d IOL for pre-eclampsia w/ SF   Labor: balloon dislodged, continue to titrate Pitocin  Preeclampsia:  on magnesium  sulfate, intake and ouput balanced Fetal Wellbeing:  Category I I/D:  GBS neg Anticipated MOD:  VBAC  Mercer KATHEE Peal DNP, CNM 02/23/2024 10:19 PM

## 2024-02-23 NOTE — Progress Notes (Signed)
 Subjective:    Headache has returned and the effects of the Fioricet  have worn off. Discussed management plan and adding additional medication. Re: induction, pt is willing to attempt cervical balloon placement again.   Objective:    VS: BP 138/82   Pulse 93   Temp 97.8 F (36.6 C) (Oral)   Resp 16   Ht 5' 5 (1.651 m)   Wt 109.8 kg   LMP  (LMP Unknown)   SpO2 100%   BMI 40.27 kg/m  FHR : baseline 120 / variability moderate / accelerations present / absent decelerations Toco: contractions every 2-4 minutes Membranes: intact Dilation: 2 Effacement (%): 40 Station: -3 Presentation: Vertex Exam by:: v Douglass Dunshee,cnm Pitocin  20 reduced to 6 mU/min after balloon placement  Assessment/Plan:   28 y.o. H6E7997 [redacted]w[redacted]d IOL for Pre-eclampsia w/ severe features  Labor: initial ripening w/ Pitocin , successful placement of Cook balloon this AM, 80 mL uterine and 60 mL vaginal. Pitocin  reduced to and held at 76mU/min Preeclampsia:  on magnesium  sulfate, labs stable, and re-occurring headache Fetal Wellbeing:  Category I I/D:  GBS neg Anticipated MOD:  VBAC  Heather KATHEE Peal DNP, CNM 02/23/2024 11:57 AM

## 2024-02-23 NOTE — Progress Notes (Signed)
 Subjective:    Cook balloon dislodged while voiding.  Objective:    FHR: baseline 125 / variability moderate / accelerations present / absent decelerations Toco: contractions every 3-5 minutes Membranes: intact 2/40/-3 S. Emery, RN Pitocin  10 mU/min  Assessment/Plan:   28 y.o. H6E7997 [redacted]w[redacted]d IOL for pre-eclampsia w/ SF  Labor: balloon dislodged, continue to titrate Pitocin  Preeclampsia:  on magnesium  sulfate, intake and ouput balanced, labs stable, and HA improved w/ Reglan  Fetal Wellbeing:  Category I I/D:  GBS neg Anticipated MOD:  VBAC  Heather KATHEE Peal DNP, CNM 02/23/2024 10:12 PM

## 2024-02-23 NOTE — Plan of Care (Signed)
  Problem: Education: Goal: Knowledge of General Education information will improve Description: Including pain rating scale, medication(s)/side effects and non-pharmacologic comfort measures Outcome: Progressing   Problem: Health Behavior/Discharge Planning: Goal: Ability to manage health-related needs will improve Outcome: Progressing   Problem: Clinical Measurements: Goal: Ability to maintain clinical measurements within normal limits will improve Outcome: Progressing Goal: Will remain free from infection Outcome: Progressing Goal: Diagnostic test results will improve Outcome: Progressing Goal: Respiratory complications will improve Outcome: Progressing Goal: Cardiovascular complication will be avoided Outcome: Progressing   Problem: Activity: Goal: Risk for activity intolerance will decrease Outcome: Progressing   Problem: Nutrition: Goal: Adequate nutrition will be maintained Outcome: Progressing   Problem: Coping: Goal: Level of anxiety will decrease Outcome: Progressing   Problem: Elimination: Goal: Will not experience complications related to bowel motility Outcome: Progressing Goal: Will not experience complications related to urinary retention Outcome: Progressing   Problem: Pain Managment: Goal: General experience of comfort will improve and/or be controlled Outcome: Progressing   Problem: Safety: Goal: Ability to remain free from injury will improve Outcome: Progressing   Problem: Skin Integrity: Goal: Risk for impaired skin integrity will decrease Outcome: Progressing   Problem: Education: Goal: Knowledge of Childbirth will improve Outcome: Progressing Goal: Ability to make informed decisions regarding treatment and plan of care will improve Outcome: Progressing Goal: Ability to state and carry out methods to decrease the pain will improve Outcome: Progressing Goal: Individualized Educational Video(s) Outcome: Progressing   Problem:  Coping: Goal: Ability to verbalize concerns and feelings about labor and delivery will improve Outcome: Progressing   Problem: Life Cycle: Goal: Ability to make normal progression through stages of labor will improve Outcome: Progressing Goal: Ability to effectively push during vaginal delivery will improve Outcome: Progressing   Problem: Role Relationship: Goal: Will demonstrate positive interactions with the child Outcome: Progressing   Problem: Safety: Goal: Risk of complications during labor and delivery will decrease Outcome: Progressing   Problem: Pain Management: Goal: Relief or control of pain from uterine contractions will improve Outcome: Progressing   Problem: Education: Goal: Knowledge of disease or condition will improve Outcome: Progressing Goal: Knowledge of the prescribed therapeutic regimen will improve Outcome: Progressing   Problem: Fluid Volume: Goal: Peripheral tissue perfusion will improve Outcome: Progressing   Problem: Clinical Measurements: Goal: Complications related to disease process, condition or treatment will be avoided or minimized Outcome: Progressing

## 2024-02-24 ENCOUNTER — Encounter (HOSPITAL_COMMUNITY): Payer: Self-pay | Admitting: Obstetrics and Gynecology

## 2024-02-24 MED ORDER — ACETAMINOPHEN 325 MG PO TABS
650.0000 mg | ORAL_TABLET | Freq: Four times a day (QID) | ORAL | Status: DC
  Administered 2024-02-24 – 2024-02-27 (×11): 650 mg via ORAL
  Filled 2024-02-24 (×12): qty 2

## 2024-02-24 MED ORDER — DIPHENHYDRAMINE HCL 25 MG PO CAPS: 25.0000 mg | ORAL_CAPSULE | Freq: Four times a day (QID) | ORAL | Status: DC | PRN

## 2024-02-24 MED ORDER — ONDANSETRON HCL 4 MG/2ML IJ SOLN: 4.0000 mg | INTRAMUSCULAR | Status: DC | PRN

## 2024-02-24 MED ORDER — TETANUS-DIPHTH-ACELL PERTUSSIS 5-2.5-18.5 LF-MCG/0.5 IM SUSY: 0.5000 mL | PREFILLED_SYRINGE | Freq: Once | INTRAMUSCULAR | Status: DC

## 2024-02-24 MED ORDER — TRANEXAMIC ACID-NACL 1000-0.7 MG/100ML-% IV SOLN
INTRAVENOUS | Status: AC
  Administered 2024-02-24: 1000 mg via INTRAVENOUS
  Filled 2024-02-24: qty 100

## 2024-02-24 MED ORDER — PRENATAL MULTIVITAMIN CH
1.0000 | ORAL_TABLET | Freq: Every day | ORAL | Status: DC
  Administered 2024-02-24 – 2024-02-27 (×4): 1 via ORAL
  Filled 2024-02-24 (×5): qty 1

## 2024-02-24 MED ORDER — IBUPROFEN 600 MG PO TABS
600.0000 mg | ORAL_TABLET | Freq: Four times a day (QID) | ORAL | Status: DC
  Administered 2024-02-24 – 2024-02-27 (×11): 600 mg via ORAL
  Filled 2024-02-24 (×12): qty 1

## 2024-02-24 MED ORDER — SIMETHICONE 80 MG PO CHEW
80.0000 mg | CHEWABLE_TABLET | ORAL | Status: DC | PRN
  Administered 2024-02-27 (×2): 80 mg via ORAL
  Filled 2024-02-24 (×2): qty 1

## 2024-02-24 MED ORDER — ONDANSETRON HCL 4 MG PO TABS: 4.0000 mg | ORAL_TABLET | ORAL | Status: DC | PRN

## 2024-02-24 MED ORDER — OXYCODONE HCL 5 MG PO TABS: 5.0000 mg | ORAL_TABLET | ORAL | Status: DC | PRN

## 2024-02-24 MED ORDER — DIBUCAINE (PERIANAL) 1 % EX OINT: 1.0000 | TOPICAL_OINTMENT | CUTANEOUS | Status: DC | PRN

## 2024-02-24 MED ORDER — SENNOSIDES-DOCUSATE SODIUM 8.6-50 MG PO TABS
2.0000 | ORAL_TABLET | Freq: Every day | ORAL | Status: DC
Start: 2024-02-25 — End: 2024-02-27
  Administered 2024-02-25 – 2024-02-27 (×3): 2 via ORAL
  Filled 2024-02-24 (×3): qty 2

## 2024-02-24 MED ORDER — OXYTOCIN-SODIUM CHLORIDE 30-0.9 UT/500ML-% IV SOLN: 2.5000 [IU]/h | INTRAVENOUS | Status: DC | PRN

## 2024-02-24 MED ORDER — TRANEXAMIC ACID-NACL 1000-0.7 MG/100ML-% IV SOLN
1000.0000 mg | INTRAVENOUS | Status: AC
Start: 2024-02-24 — End: 2024-02-24

## 2024-02-24 MED ORDER — WITCH HAZEL-GLYCERIN EX PADS: 1.0000 | MEDICATED_PAD | CUTANEOUS | Status: DC | PRN

## 2024-02-24 MED ORDER — LACTATED RINGERS IV SOLN: INTRAVENOUS | Status: AC

## 2024-02-24 MED ORDER — COCONUT OIL OIL: 1.0000 | TOPICAL_OIL | Status: DC | PRN

## 2024-02-24 MED ORDER — BENZOCAINE-MENTHOL 20-0.5 % EX AERO
1.0000 | INHALATION_SPRAY | CUTANEOUS | Status: DC | PRN
  Administered 2024-02-24: 1 via TOPICAL
  Filled 2024-02-24: qty 56

## 2024-02-24 MED ORDER — MAGNESIUM SULFATE 40 GM/1000ML IV SOLN: 2.0000 g/h | INTRAVENOUS | Status: AC

## 2024-02-24 NOTE — Progress Notes (Signed)
 PT Iv got infiltrated RN advised pt about a need for a new iv and restart her mag. Pt does not want a new iv and just want the mag turned off. Called Dr Rosalva to make her aware, she was not in agreement due to pt being at high risk for seizures. I went back and explain the reason and need for her to be on MAG, Pt stated her b/p has been fine and she has been on mag for 2 days. She politely refused her iv to be restarted and decline the mag. Rn told Press photographer. Rn will continue to monitor pt closely.

## 2024-02-24 NOTE — Lactation Note (Signed)
 This note was copied from a baby's chart. Lactation Consultation Note  Patient Name: Heather Noble Unijb'd Date: 02/24/2024 Age:29 hours Reason for consult: Initial assessment;1st time breastfeeding;Term  P3, First time breastfeeding.  Mother on MgSo4. Baby at breast in football hold when [facility] entered room. Added another pillow to bring baby to nipple height. Reviewed basics and guided baby deep on breast. Noted intermittent swallows.  Baby breasfed for 12 min.  Both parents sleepy. Swaddled baby and placed her in the crib for parents can rest.   Maternal Data Has patient been taught Hand Expression?: Yes Does the patient have breastfeeding experience prior to this delivery?: No  Feeding Mother's Current Feeding Choice: Breast Milk and Formula  LATCH Score Latch: Grasps breast easily, tongue down, lips flanged, rhythmical sucking.  Audible Swallowing: A few with stimulation  Type of Nipple: Everted at rest and after stimulation  Comfort (Breast/Nipple): Soft / non-tender  Hold (Positioning): Assistance needed to correctly position infant at breast and maintain latch.  LATCH Score: 8  Interventions Interventions: Breast feeding basics reviewed;Assisted with latch;Skin to skin;Hand express;Support pillows;Education  Discharge Pump: Personal;DEBP (Medela)  Consult Status Consult Status: Follow-up Date: 02/25/24 Follow-up type: In-patient   Shannon Levorn Lemme  RN, IBCLC 02/24/2024, 10:54 AM

## 2024-02-24 NOTE — Progress Notes (Signed)
 Subjective:    Breathing w/ contractions, requests IV pain med.   Objective:    VS: BP (!) 146/99   Pulse 97   Temp 98.3 F (36.8 C) (Oral)   Resp 20   Ht 5' 5 (1.651 m)   Wt 109.8 kg   LMP  (LMP Unknown)   SpO2 100%   BMI 40.27 kg/m  FHR : baseline 125 / variability moderate / accelerations present / early decelerations Toco: contractions every 2-5 minutes  Membranes: AROM, x9 hrs, remains clear Dilation: 6 Effacement (%): 80 Cervical Position: Middle Station: -1 Presentation: Vertex Exam by:: V.Tiran Sauseda, CNM Pitocin  22 reduced to 10 mU/min  Intake/Output Summary (Last 24 hours) at 02/24/2024 0620 Last data filed at 02/24/2024 0500 Gross per 24 hour  Intake 3535.48 ml  Output 6375 ml  Net -2839.52 ml     Assessment/Plan:   28 y.o. [id number] [redacted]w[redacted]d IOL for pre-e w/ SF  Labor: Progressing normally Preeclampsia:  on magnesium  sulfate and intake and ouput balanced Fetal Wellbeing:  Category I Pain Control:  IV pain meds I/D:  GBS neg Anticipated MOD:  VBAC  Mercer KATHEE Peal DNP, CNM 02/24/2024 6:18 AM

## 2024-02-25 LAB — COMPREHENSIVE METABOLIC PANEL WITH GFR
ALT: 13 U/L (ref 0–44)
AST: 21 U/L (ref 15–41)
Albumin: 2.6 g/dL — ABNORMAL LOW (ref 3.5–5.0)
Alkaline Phosphatase: 105 U/L (ref 38–126)
Anion gap: 9 (ref 5–15)
BUN: 6 mg/dL (ref 6–20)
CO2: 23 mmol/L (ref 22–32)
Calcium: 7.9 mg/dL — ABNORMAL LOW (ref 8.9–10.3)
Chloride: 103 mmol/L (ref 98–111)
Creatinine, Ser: 1.05 mg/dL — ABNORMAL HIGH (ref 0.44–1.00)
GFR, Estimated: >60 mL/min (ref >60)
Glucose, Bld: 103 mg/dL — ABNORMAL HIGH (ref 70–99)
Potassium: 4.4 mmol/L (ref 3.5–5.1)
Sodium: 135 mmol/L (ref 135–145)
Total Bilirubin: 0.7 mg/dL (ref 0.0–1.2)
Total Protein: 6.6 g/dL (ref 6.5–8.1)

## 2024-02-25 LAB — GC/CHLAMYDIA PROBE AMP (~~LOC~~) NOT AT ARMC
Chlamydia: NEGATIVE
Comment: NEGATIVE
Comment: NORMAL
Neisseria Gonorrhea: NEGATIVE

## 2024-02-25 LAB — CBC
HCT: 27.5 % — ABNORMAL LOW (ref 36.0–46.0)
Hemoglobin: 9.1 g/dL — ABNORMAL LOW (ref 12.0–15.0)
MCH: 30.2 pg (ref 26.0–34.0)
MCHC: 33.1 g/dL (ref 30.0–36.0)
MCV: 91.4 fL (ref 80.0–100.0)
Platelets: 277 K/uL (ref 150–400)
RBC: 3.01 MIL/uL — ABNORMAL LOW (ref 3.87–5.11)
RDW: 14.5 % (ref 11.5–15.5)
WBC: 15.9 K/uL — ABNORMAL HIGH (ref 4.0–10.5)
nRBC: 0 % (ref 0.0–0.2)

## 2024-02-25 NOTE — Progress Notes (Signed)
 Educated patient about recommendation for MMR vaccine and provided CDC VIS. Declined MMR vaccine.

## 2024-02-25 NOTE — Plan of Care (Signed)
  Problem: Clinical Measurements: Goal: Ability to maintain clinical measurements within normal limits will improve Outcome: Progressing Goal: Will remain free from infection Outcome: Progressing Goal: Diagnostic test results will improve Outcome: Progressing Goal: Respiratory complications will improve Outcome: Progressing Goal: Cardiovascular complication will be avoided Outcome: Progressing   Problem: Elimination: Goal: Will not experience complications related to bowel motility Outcome: Progressing   Problem: Pain Managment: Goal: General experience of comfort will improve and/or be controlled Outcome: Progressing   Problem: Safety: Goal: Ability to remain free from injury will improve Outcome: Progressing   Problem: Skin Integrity: Goal: Risk for impaired skin integrity will decrease Outcome: Progressing   Problem: Fluid Volume: Goal: Peripheral tissue perfusion will improve Outcome: Progressing   Problem: Clinical Measurements: Goal: Complications related to disease process, condition or treatment will be avoided or minimized Outcome: Progressing   Problem: Coping: Goal: Ability to identify and utilize available resources and services will improve Outcome: Progressing   Problem: Life Cycle: Goal: Chance of risk for complications during the postpartum period will decrease Outcome: Progressing   Problem: Skin Integrity: Goal: Demonstration of wound healing without infection will improve Outcome: Progressing

## 2024-02-26 ENCOUNTER — Ambulatory Visit (HOSPITAL_BASED_OUTPATIENT_CLINIC_OR_DEPARTMENT_OTHER): Admitting: Physical Therapy

## 2024-02-26 DIAGNOSIS — O34219 Maternal care for unspecified type scar from previous cesarean delivery: Secondary | ICD-10-CM | POA: Diagnosis not present

## 2024-02-26 LAB — COMPREHENSIVE METABOLIC PANEL WITH GFR
ALT: 13 U/L (ref 0–44)
AST: 19 U/L (ref 15–41)
Albumin: 2.4 g/dL — ABNORMAL LOW (ref 3.5–5.0)
Alkaline Phosphatase: 74 U/L (ref 38–126)
Anion gap: 9 (ref 5–15)
BUN: 12 mg/dL (ref 6–20)
CO2: 24 mmol/L (ref 22–32)
Calcium: 8.6 mg/dL — ABNORMAL LOW (ref 8.9–10.3)
Chloride: 108 mmol/L (ref 98–111)
Creatinine, Ser: 1.09 mg/dL — ABNORMAL HIGH (ref 0.44–1.00)
GFR, Estimated: >60 mL/min (ref >60)
Glucose, Bld: 97 mg/dL (ref 70–99)
Potassium: 4.3 mmol/L (ref 3.5–5.1)
Sodium: 141 mmol/L (ref 135–145)
Total Bilirubin: 0.6 mg/dL (ref 0.0–1.2)
Total Protein: 5.6 g/dL — ABNORMAL LOW (ref 6.5–8.1)

## 2024-02-26 LAB — CBC
HCT: 23.1 % — ABNORMAL LOW (ref 36.0–46.0)
Hemoglobin: 7.7 g/dL — ABNORMAL LOW (ref 12.0–15.0)
MCH: 30.6 pg (ref 26.0–34.0)
MCHC: 33.3 g/dL (ref 30.0–36.0)
MCV: 91.7 fL (ref 80.0–100.0)
Platelets: 247 K/uL (ref 150–400)
RBC: 2.52 MIL/uL — ABNORMAL LOW (ref 3.87–5.11)
RDW: 14.4 % (ref 11.5–15.5)
WBC: 10 K/uL (ref 4.0–10.5)
nRBC: 0 % (ref 0.0–0.2)

## 2024-02-26 MED ORDER — SODIUM CHLORIDE 0.9 % IV SOLN
500.0000 mg | Freq: Once | INTRAVENOUS | Status: AC
  Administered 2024-02-26: 500 mg via INTRAVENOUS
  Filled 2024-02-26: qty 25

## 2024-02-26 MED ORDER — DIPHENHYDRAMINE HCL 50 MG/ML IJ SOLN: 25.0000 mg | Freq: Once | INTRAMUSCULAR | Status: DC | PRN

## 2024-02-26 MED ORDER — IRON SUCROSE 500 MG IVPB - SIMPLE MED
500.0000 mg | Freq: Once | INTRAVENOUS | Status: DC
  Filled 2024-02-26: qty 275

## 2024-02-26 MED ORDER — EPINEPHRINE 0.3 MG/0.3ML IJ SOAJ: 0.3000 mg | Freq: Once | INTRAMUSCULAR | Status: DC | PRN

## 2024-02-26 MED ORDER — POLYSACCHARIDE IRON COMPLEX 150 MG PO CAPS
150.0000 mg | ORAL_CAPSULE | Freq: Every day | ORAL | Status: DC
  Administered 2024-02-26 – 2024-02-27 (×2): 150 mg via ORAL
  Filled 2024-02-26 (×2): qty 1

## 2024-02-26 MED ORDER — SODIUM CHLORIDE 0.9 % IV SOLN: INTRAVENOUS | Status: AC | PRN

## 2024-02-26 MED ORDER — SODIUM CHLORIDE 0.9 % IV BOLUS: 500.0000 mL | Freq: Once | INTRAVENOUS | Status: DC | PRN

## 2024-02-26 MED ORDER — ALBUTEROL SULFATE (2.5 MG/3ML) 0.083% IN NEBU: 2.5000 mg | INHALATION_SOLUTION | Freq: Once | RESPIRATORY_TRACT | Status: DC | PRN

## 2024-02-26 MED ORDER — NIFEDIPINE ER OSMOTIC RELEASE 30 MG PO TB24
30.0000 mg | ORAL_TABLET | Freq: Every day | ORAL | Status: DC
  Administered 2024-02-26 – 2024-02-27 (×2): 30 mg via ORAL
  Filled 2024-02-26 (×2): qty 1

## 2024-02-26 MED ORDER — LACTATED RINGERS IV SOLN: INTRAVENOUS | Status: AC

## 2024-02-26 MED ORDER — METHYLPREDNISOLONE SODIUM SUCC 125 MG IJ SOLR: 125.0000 mg | Freq: Once | INTRAMUSCULAR | Status: DC | PRN

## 2024-02-26 NOTE — Progress Notes (Addendum)
 PPD #2 S/P VBAC  Live born female  Birth Weight: 7 lb 14.3 oz (3580 g) APGAR: 7, 9  Newborn Delivery   Birth date/time: 02/24/2024 06:54:00 Delivery type: Vaginal, Spontaneous    Baby name: Heather Noble  Delivering provider: ARNELL SILK Noble  Lacerations: None  Feeding: breast and bottle  Pain control at delivery: None  S:  Reports feeling well. Ready fro discharge. Denies PEC symptoms. States she did not take any medications for BP during pregnancy. Partner at the bedside.              Tolerating PO/No nausea or vomiting             Bleeding is light             Pain controlled with acetaminophen  and ibuprofen  (OTC)             Up ad lib/ambulatory/voiding without difficulties   O:  A & O x 3, in no apparent distress  Vitals:   02/25/24 1652 02/25/24 2045 02/26/24 0026 02/26/24 0809  BP: 125/74 130/79 (!) 142/91 133/87  Pulse: 81 71 69 72  Resp: 17 18 17 18   Temp: 98 F (36.7 C) 98.7 F (37.1 C) 98.3 F (36.8 C) 98.4 F (36.9 C)  TempSrc: Oral Oral Oral Oral  SpO2: 100% 98% 99% 99%  Weight:      Height:       Results for orders placed or performed during the hospital encounter of 02/22/24 (from the past 48 hours)  CBC     Status: Abnormal   Collection Time: 02/25/24  4:27 AM  Result Value Ref Range   WBC 15.9 (H) 4.0 - 10.5 K/uL   RBC 3.01 (L) 3.87 - 5.11 MIL/uL   Hemoglobin 9.1 (L) 12.0 - 15.0 g/dL   HCT 72.4 (L) 63.9 - 53.9 %   MCV 91.4 80.0 - 100.0 fL   MCH 30.2 26.0 - 34.0 pg   MCHC 33.1 30.0 - 36.0 g/dL   RDW 85.4 88.4 - 84.4 %   Platelets 277 150 - 400 K/uL   nRBC 0.0 0.0 - 0.2 %    Comment: Performed at [facility], [address] [place], [place] [address]  Comprehensive metabolic panel     Status: Abnormal   Collection Time: 02/25/24  4:27 AM  Result Value Ref Range   Sodium 135 135 - 145 mmol/L   Potassium 4.4 3.5 - 5.1 mmol/L   Chloride 103 98 - 111 mmol/L   CO2 23 22 - 32 mmol/L   Glucose, Bld 103 (H) 70 - 99 mg/dL    Comment: Glucose  reference range applies only to samples taken after fasting for at least 8 hours.   BUN 6 6 - 20 mg/dL   Creatinine, Ser 8.94 (H) 0.44 - 1.00 mg/dL   Calcium  7.9 (L) 8.9 - 10.3 mg/dL   Total Protein 6.6 6.5 - 8.1 g/dL   Albumin 2.6 (L) 3.5 - 5.0 g/dL   AST 21 15 - 41 U/L   ALT 13 0 - 44 U/L   Alkaline Phosphatase 105 38 - 126 U/L   Total Bilirubin 0.7 0.0 - 1.2 mg/dL   GFR, Estimated >39 >39 mL/min    Comment: (NOTE) Calculated using the CKD-EPI Creatinine Equation (2021)    Anion gap 9 5 - 15    Comment: Performed at [facility], [address] [place], [place] [address]     Blood type: --/--/A POS (09/19  1612)  Rubella: Nonimmune (06/04 0000)   I&O: I/O last 3 completed shifts: In: 840 [P.O.:840] Out: 2600 [Urine:2600]          No intake/output data recorded.  Gen: AAO x 3, NAD Abdomen: soft, non-tender, non-distended Fundus: firm, non-tender, U-2 Perineum: intact Lochia: small Extremities: 1+ non-pitting edema, no calf pain or tenderness   A/P:  PPD # 2 27 y.o., [id number]  Principal Problem:   Postpartum care following vaginal delivery 9/21  Doing well - stable status  Routine post partum orders Active Problems:   Sickle cell trait   Anemia during pregnancy  Hgb 9.1, not clinically significant, asymptomatic  Continue PO iron    Preeclampsia, severe, third trimester  BPs mild range to normal overnight  S/P 5 hours of magnesium  sulfate postpartum, patient declines further treatment due to side effects  Normal LFTs and platelets yesterday, creatinine slightly elevated at 1.05  Consult with Dr. Henry, will recheck CBC/CMP now and start Procardia  XL 30mg  daily  If stable this afternoon, will consider discharge home with close PP F/U   VBAC (vaginal birth after Cesarean)   Continue current care. Consider discharge home later today.   Heather MARLA Molt, MSN, CNM 02/26/2024, 11:45 AM  Addendum: Anemia, HGB this AM 7.7, will order IV Venofer  today  Addendum:  Creatinine with slight bump from yesterday, now 1.09 from 1.05. Will continue IV fluids overnight at 75cc/hr and recheck CMP in the AM. Continue Procardia  30mg  XL daily.

## 2024-02-26 NOTE — Lactation Note (Signed)
 This note was copied from a baby's chart. Lactation Consultation Note  Patient Name: Heather Noble Unijb'd Date: 02/26/2024 Age:29 hours Reason for consult: Follow-up assessment;1st time breastfeeding;Term;Other (Comment);Mother's request;RN request (cleft palate)  [facility] RN Princess asked this LC to visit with family of 41 hours old FT female; because Ms. Cadogan wanted to get a pump to take home. Offered a loaner pump since her insurance plan is not eligible for [facility] and showed her how to use the hand pump. She got set up with a DEBP but noticed that pumping hasn't been consistent, parents have been working primarily on bottle feedings due to baby's cleft palate and using a Dr. Delores preemie nipple with one way blue valve. Couplet was supposed to go home today but discharged was cancelled due to recent discovery of baby's cleft palate. Referral to [facility] sent for [facility] OP F/U. Encouraged to feed baby +8 times/24 hours or sooner if feeding cues are present and to pump whenever baby is having a bottle. Visitors present. All questions and concerns answered, family to contact [facility] services PRN.  Feeding Mother's Current Feeding Choice: Breast Milk and Formula Nipple Type: Dr. Jonna Preemie (with blue one way valve)  Lactation Tools Discussed/Used Tools: Pump;Flanges Flange Size: 21;24 Breast pump type: Double-Electric Breast Pump Pump Education: Setup, frequency, and cleaning;Milk Storage Reason for Pumping: difficult latch, cleft palate Pumping frequency: 2 times/24 hours; recommended pumping q 3 hours Pumped volume:  (drops)  Interventions Interventions: Breast feeding basics reviewed;DEBP;Education  Discharge Discharge Education: Engorgement and breast care;Warning signs for feeding baby;Outpatient recommendation;Outpatient Epic message sent Pump: Advised to call insurance company (Medela DEBP at home but unsure if it's working; not elegible for [place] pump at this time. Offered a  loaner pump on 02/26/2024)  Consult Status Consult Status: Complete Date: 02/26/24 Follow-up type: Call as needed   Shawne Bulow GORMAN Crate 02/26/2024, 1:14 PM

## 2024-02-27 ENCOUNTER — Other Ambulatory Visit (HOSPITAL_COMMUNITY): Payer: Self-pay

## 2024-02-27 LAB — COMPREHENSIVE METABOLIC PANEL WITH GFR
ALT: 21 U/L (ref 0–44)
AST: 35 U/L (ref 15–41)
Albumin: 2.6 g/dL — ABNORMAL LOW (ref 3.5–5.0)
Alkaline Phosphatase: 88 U/L (ref 38–126)
Anion gap: 9 (ref 5–15)
BUN: 10 mg/dL (ref 6–20)
CO2: 25 mmol/L (ref 22–32)
Calcium: 9.1 mg/dL (ref 8.9–10.3)
Chloride: 106 mmol/L (ref 98–111)
Creatinine, Ser: 0.7 mg/dL (ref 0.44–1.00)
GFR, Estimated: >60 mL/min (ref >60)
Glucose, Bld: 90 mg/dL (ref 70–99)
Potassium: 3.8 mmol/L (ref 3.5–5.1)
Sodium: 140 mmol/L (ref 135–145)
Total Bilirubin: 0.6 mg/dL (ref 0.0–1.2)
Total Protein: 6.7 g/dL (ref 6.5–8.1)

## 2024-02-27 MED ORDER — FUROSEMIDE 20 MG PO TABS
20.0000 mg | ORAL_TABLET | Freq: Every day | ORAL | 0 refills | Status: AC
  Filled 2024-02-27: qty 3, 3d supply, fill #0

## 2024-02-27 MED ORDER — IBUPROFEN 600 MG PO TABS
600.0000 mg | ORAL_TABLET | Freq: Four times a day (QID) | ORAL | 1 refills | Status: AC | PRN
  Filled 2024-02-27: qty 40, 10d supply, fill #0

## 2024-02-27 MED ORDER — FERROUS SULFATE 325 (65 FE) MG PO TABS
325.0000 mg | ORAL_TABLET | ORAL | 0 refills | Status: AC
  Filled 2024-02-27: qty 40, 80d supply, fill #0

## 2024-02-27 MED ORDER — ACETAMINOPHEN 325 MG PO TABS
650.0000 mg | ORAL_TABLET | Freq: Four times a day (QID) | ORAL | 1 refills | Status: AC | PRN
  Filled 2024-02-27: qty 40, 5d supply, fill #0

## 2024-02-27 MED ORDER — NIFEDIPINE ER 30 MG PO TB24
30.0000 mg | ORAL_TABLET | Freq: Every day | ORAL | 1 refills | Status: AC
  Filled 2024-02-27: qty 40, 40d supply, fill #0

## 2024-02-27 NOTE — Discharge Summary (Signed)
 Postpartum Discharge Summary  Date of Service updated 02-27-24     Patient Name: Heather Noble DOB: 1995-06-22 MRN: [id number]  Date of admission: 02/22/2024 Delivery date:02/24/2024 Delivering provider: ARNELL SILK B Date of discharge: 02/27/2024  Admitting diagnosis: Headache in pregnancy, third trimester [O26.893, R51.9] Intrauterine pregnancy: [redacted]w[redacted]d     Secondary diagnosis:  Principal Problem:   Postpartum care following vaginal delivery 9/21 Active Problems:   Sickle cell trait   Anemia during pregnancy   Preeclampsia, severe, third trimester   VBAC (vaginal birth after Cesarean)  Additional problems: swelling    Discharge diagnosis: Term Pregnancy Delivered, Preeclampsia (severe), and Anemia                                              Post partum procedures:none Augmentation: Pitocin  Complications: None  Hospital course: Induction of Labor With Vaginal Delivery   28 y.o. yo G3P3003 at [redacted]w[redacted]d was admitted to the hospital 02/22/2024 for induction of labor.  Indication for induction: Preeclampsia.  Patient had an labor course complicated by nothing Membrane Rupture Time/Date: 9:36 PM,02/23/2024  Delivery Method:Vaginal, Spontaneous Operative Delivery:N/A Episiotomy: None Lacerations:  None Details of delivery can be found in separate delivery note.  Patient had a postpartum course complicated by nothing. Patient is discharged home 02/28/24.  Newborn Data: Birth date:02/24/2024 Birth time:6:54 AM Gender:Female Living status:Living Apgars:7 ,9  Weight:3580 g  Magnesium  Sulfate received: Yes: Seizure prophylaxis  Immunizations administered: There is no immunization history for the selected administration types on file for this patient.  Physical exam  Vitals:   02/27/24 0008 02/27/24 0351 02/27/24 0831 02/27/24 1148  BP: 137/85 (!) 122/92 132/83 (!) 127/97  Pulse: 77 84 88 97  Resp: 17 16 17 17   Temp: 98 F (36.7 C) 97.6 F (36.4 C) 98.2 F (36.8 C)  98.4 F (36.9 C)  TempSrc: Oral Axillary Oral Axillary  SpO2: 100% 99% 99% 100%  Weight:      Height:       General: alert, cooperative, and no distress Lochia: appropriate Uterine Fundus: firm Incision: N/A DVT Evaluation: No evidence of DVT seen on physical exam. No cords or calf tenderness. 2+ edema  Labs: Lab Results  Component Value Date   WBC 10.0 02/26/2024   HGB 7.7 (L) 02/26/2024   HCT 23.1 (L) 02/26/2024   MCV 91.7 02/26/2024   PLT 247 02/26/2024      Latest Ref Rng & Units 02/27/2024    5:05 AM  CMP  Glucose 70 - 99 mg/dL 90   BUN 6 - 20 mg/dL 10   Creatinine 9.55 - 1.00 mg/dL 9.29   Sodium 864 - 854 mmol/L 140   Potassium 3.5 - 5.1 mmol/L 3.8   Chloride 98 - 111 mmol/L 106   CO2 22 - 32 mmol/L 25   Calcium  8.9 - 10.3 mg/dL 9.1   Total Protein 6.5 - 8.1 g/dL 6.7   Total Bilirubin 0.0 - 1.2 mg/dL 0.6   Alkaline Phos 38 - 126 U/L 88   AST 15 - 41 U/L 35   ALT 0 - 44 U/L 21    Edinburgh Score:    02/25/2024    1:33 PM  Edinburgh Postnatal Depression Scale Screening Tool  I have been able to laugh and see the funny side of things. 0  I have looked forward with enjoyment to things. 0  I have blamed myself unnecessarily when things went wrong. 0  I have been anxious or worried for no good reason. 0  I have felt scared or panicky for no good reason. 0  Things have been getting on top of me. 0  I have been so unhappy that I have had difficulty sleeping. 0  I have felt sad or miserable. 0  I have been so unhappy that I have been crying. 0  The thought of harming myself has occurred to me. 0  Edinburgh Postnatal Depression Scale Total 0      After visit meds:  Allergies as of 02/27/2024   No Known Allergies      Medication List     STOP taking these medications    norgestimate -ethinyl estradiol  0.25-35 MG-MCG tablet Commonly known as: ORTHO-CYCLEN   PRENATAL VITAMIN PO       TAKE these medications    acetaminophen  325 MG  tablet Commonly known as: Tylenol  Take 2 tablets (650 mg total) by mouth every 6 (six) hours as needed.   ferrous sulfate  325 (65 FE) MG tablet Take 1 tablet (325 mg total) by mouth every other day.   furosemide  20 MG tablet Commonly known as: Lasix  Take 1 tablet (20 mg total) by mouth daily for 3 days.   ibuprofen  600 MG tablet Commonly known as: ADVIL  Take 1 tablet (600 mg total) by mouth every 6 (six) hours as needed.   NIFEdipine  30 MG 24 hr tablet Commonly known as: ADALAT  CC Take 1 tablet (30 mg total) by mouth daily. Start taking on: February 28, 2024         Discharge home in stable condition Infant Feeding: Bottle Infant Disposition:home with mother Discharge instruction: per After Visit Summary and Postpartum booklet. Activity: Advance as tolerated. Pelvic rest for 6 weeks.  Diet: routine diet Anticipated Birth Control: ? IUD Postpartum Appointment:6 weeks Additional Postpartum F/U: BP check 1 week Future Appointments: Future Appointments  Date Time Provider Department Center  04/29/2024 11:00 AM Paseda, Folashade R, FNP [facility]   Follow up Visit:  Follow-up Information     [facility]. Schedule an appointment as soon as possible for a visit in 1 week(s).   Specialty: Obstetrics and Gynecology Why: FOR BLOOD PRESSURE CHECK (IT CAN BE A LAB VISIT) Contact information: [address] [place]  [phone]        [facility]. Schedule an appointment as soon as possible for a visit in 6 week(s).   Specialty: Obstetrics and Gynecology Why: For Postpartum follow-up Contact information: [address] [place]  [address] [phone]                    02/27/2024 Jon CINDERELLA Rummer, MD

## 2024-02-27 NOTE — Plan of Care (Signed)
  Problem: Clinical Measurements: Goal: Ability to maintain clinical measurements within normal limits will improve Outcome: Adequate for Discharge Goal: Will remain free from infection Outcome: Adequate for Discharge Goal: Diagnostic test results will improve Outcome: Adequate for Discharge Goal: Respiratory complications will improve Outcome: Adequate for Discharge Goal: Cardiovascular complication will be avoided Outcome: Adequate for Discharge   Problem: Elimination: Goal: Will not experience complications related to bowel motility Outcome: Adequate for Discharge   Problem: Pain Managment: Goal: General experience of comfort will improve and/or be controlled Outcome: Adequate for Discharge   Problem: Safety: Goal: Ability to remain free from injury will improve Outcome: Adequate for Discharge   Problem: Skin Integrity: Goal: Risk for impaired skin integrity will decrease Outcome: Adequate for Discharge   Problem: Fluid Volume: Goal: Peripheral tissue perfusion will improve Outcome: Adequate for Discharge   Problem: Clinical Measurements: Goal: Complications related to disease process, condition or treatment will be avoided or minimized Outcome: Adequate for Discharge   Problem: Coping: Goal: Ability to identify and utilize available resources and services will improve Outcome: Adequate for Discharge   Problem: Life Cycle: Goal: Chance of risk for complications during the postpartum period will decrease Outcome: Adequate for Discharge   Problem: Skin Integrity: Goal: Demonstration of wound healing without infection will improve Outcome: Adequate for Discharge

## 2024-02-29 ENCOUNTER — Ambulatory Visit: Admitting: Physical Therapy

## 2024-03-06 ENCOUNTER — Telehealth (HOSPITAL_COMMUNITY): Payer: Self-pay | Admitting: *Deleted

## 2024-03-06 NOTE — Telephone Encounter (Signed)
 03/06/2024  Name: Heather Noble MRN: [id number] DOB: [birth year]  Reason for Call:  Transition of Care Hospital Discharge Call  Contact Status: Patient Contact Status: Complete  Language assistant needed: Interpreter Mode: Interpreter Not Needed        Follow-Up Questions: Do You Have Any Concerns About Your Health As You Heal From Delivery?: No Do You Have Any Concerns About Your Infants Health?: No  Edinburgh Postnatal Depression Scale:  In the Past 7 Days:    PHQ2-9 Depression Scale:     Discharge Follow-up: Edinburgh score requires follow up?:  (says answers are the same as in the hospital when score was 0, endorses she is doing well emotionally) Patient was advised of the following resources:: Support Group, Breastfeeding Support Group  Post-discharge interventions: Reviewed Newborn Safe Sleep Practices  Mliss Sieve, RN 03/06/2024 11:18

## 2024-04-29 ENCOUNTER — Encounter: Payer: Self-pay | Admitting: Nurse Practitioner
